# Patient Record
Sex: Female | Born: 1945
Health system: Southern US, Community
[De-identification: ages and names within clinical notes are randomized; demographics above are authoritative.]

## PROBLEM LIST (undated history)

## (undated) DIAGNOSIS — K219 Gastro-esophageal reflux disease without esophagitis: Secondary | ICD-10-CM

## (undated) DIAGNOSIS — L900 Lichen sclerosus et atrophicus: Principal | ICD-10-CM

## (undated) DIAGNOSIS — I10 Essential (primary) hypertension: Secondary | ICD-10-CM

## (undated) DIAGNOSIS — R87629 Unspecified abnormal cytological findings in specimens from vagina: Secondary | ICD-10-CM

## (undated) DIAGNOSIS — IMO0002 Reserved for concepts with insufficient information to code with codable children: Secondary | ICD-10-CM

## (undated) DIAGNOSIS — Z9221 Personal history of antineoplastic chemotherapy: Secondary | ICD-10-CM

## (undated) DIAGNOSIS — B369 Superficial mycosis, unspecified: Secondary | ICD-10-CM

## (undated) DIAGNOSIS — E119 Type 2 diabetes mellitus without complications: Secondary | ICD-10-CM

## (undated) DIAGNOSIS — Z923 Personal history of irradiation: Secondary | ICD-10-CM

## (undated) DIAGNOSIS — N904 Leukoplakia of vulva: Secondary | ICD-10-CM

## (undated) DIAGNOSIS — R87619 Unspecified abnormal cytological findings in specimens from cervix uteri: Secondary | ICD-10-CM

## (undated) DIAGNOSIS — C50919 Malignant neoplasm of unspecified site of unspecified female breast: Principal | ICD-10-CM

## (undated) DIAGNOSIS — E7800 Pure hypercholesterolemia, unspecified: Secondary | ICD-10-CM

## (undated) DIAGNOSIS — L80 Vitiligo: Secondary | ICD-10-CM

## (undated) DIAGNOSIS — M8580 Other specified disorders of bone density and structure, unspecified site: Secondary | ICD-10-CM

## (undated) HISTORY — DX: Other specified disorders of bone density and structure, unspecified site: M85.80

## (undated) HISTORY — DX: Essential (primary) hypertension: I10

## (undated) HISTORY — DX: Gastro-esophageal reflux disease without esophagitis: K21.9

## (undated) HISTORY — DX: Type 2 diabetes mellitus without complications: E11.9

## (undated) HISTORY — PX: HYSTEROSCOPY: SHX211

## (undated) HISTORY — DX: Pure hypercholesterolemia, unspecified: E78.00

## (undated) HISTORY — DX: Vitiligo: L80

## (undated) HISTORY — DX: Personal history of irradiation: Z92.3

## (undated) HISTORY — DX: Leukoplakia of vulva: N90.4

## (undated) HISTORY — DX: Superficial mycosis, unspecified: B36.9

## (undated) HISTORY — DX: Lichen sclerosus et atrophicus: L90.0

## (undated) HISTORY — DX: Unspecified abnormal cytological findings in specimens from cervix uteri: R87.619

## (undated) HISTORY — DX: Reserved for concepts with insufficient information to code with codable children: IMO0002

## (undated) HISTORY — DX: Malignant neoplasm of unspecified site of unspecified female breast: C50.919

## (undated) HISTORY — DX: Unspecified abnormal cytological findings in specimens from vagina: R87.629

## (undated) HISTORY — PX: BLADDER SUSPENSION: SHX72

---

## 1991-10-31 HISTORY — PX: TUBAL LIGATION: SHX77

## 2002-11-21 ENCOUNTER — Ambulatory Visit (HOSPITAL_COMMUNITY): Admission: RE | Admit: 2002-11-21 | Discharge: 2002-11-21 | Payer: Self-pay | Admitting: Internal Medicine

## 2004-02-11 ENCOUNTER — Ambulatory Visit (HOSPITAL_COMMUNITY): Admission: RE | Admit: 2004-02-11 | Discharge: 2004-02-11 | Payer: Self-pay | Admitting: Obstetrics & Gynecology

## 2004-09-30 ENCOUNTER — Ambulatory Visit (HOSPITAL_COMMUNITY): Admission: RE | Admit: 2004-09-30 | Discharge: 2004-09-30 | Payer: Self-pay | Admitting: Obstetrics and Gynecology

## 2004-10-10 ENCOUNTER — Ambulatory Visit: Payer: Self-pay | Admitting: Orthopedic Surgery

## 2007-07-09 ENCOUNTER — Ambulatory Visit (HOSPITAL_COMMUNITY): Admission: RE | Admit: 2007-07-09 | Discharge: 2007-07-09 | Payer: Self-pay | Admitting: Family Medicine

## 2007-07-24 ENCOUNTER — Ambulatory Visit (HOSPITAL_COMMUNITY): Admission: RE | Admit: 2007-07-24 | Discharge: 2007-07-24 | Payer: Self-pay | Admitting: Family Medicine

## 2007-08-16 ENCOUNTER — Ambulatory Visit (HOSPITAL_COMMUNITY): Admission: RE | Admit: 2007-08-16 | Discharge: 2007-08-16 | Payer: Self-pay | Admitting: Internal Medicine

## 2007-08-16 ENCOUNTER — Encounter: Payer: Self-pay | Admitting: Internal Medicine

## 2007-08-16 ENCOUNTER — Ambulatory Visit: Payer: Self-pay | Admitting: Internal Medicine

## 2008-07-27 ENCOUNTER — Ambulatory Visit (HOSPITAL_COMMUNITY): Admission: RE | Admit: 2008-07-27 | Discharge: 2008-07-27 | Payer: Self-pay | Admitting: Obstetrics and Gynecology

## 2008-07-28 ENCOUNTER — Encounter (INDEPENDENT_AMBULATORY_CARE_PROVIDER_SITE_OTHER): Payer: Self-pay | Admitting: Diagnostic Radiology

## 2008-07-28 ENCOUNTER — Encounter: Admission: RE | Admit: 2008-07-28 | Discharge: 2008-07-28 | Payer: Self-pay | Admitting: Obstetrics and Gynecology

## 2008-08-04 ENCOUNTER — Ambulatory Visit (HOSPITAL_COMMUNITY): Admission: RE | Admit: 2008-08-04 | Discharge: 2008-08-04 | Payer: Self-pay | Admitting: Obstetrics and Gynecology

## 2008-08-19 ENCOUNTER — Encounter: Admission: RE | Admit: 2008-08-19 | Discharge: 2008-08-19 | Payer: Self-pay | Admitting: Surgery

## 2008-08-20 ENCOUNTER — Encounter (INDEPENDENT_AMBULATORY_CARE_PROVIDER_SITE_OTHER): Payer: Self-pay | Admitting: Surgery

## 2008-08-20 ENCOUNTER — Ambulatory Visit (HOSPITAL_BASED_OUTPATIENT_CLINIC_OR_DEPARTMENT_OTHER): Admission: RE | Admit: 2008-08-20 | Discharge: 2008-08-20 | Payer: Self-pay | Admitting: Surgery

## 2008-08-20 ENCOUNTER — Encounter: Admission: RE | Admit: 2008-08-20 | Discharge: 2008-08-20 | Payer: Self-pay | Admitting: Surgery

## 2008-08-20 HISTORY — PX: BREAST LUMPECTOMY: SHX2

## 2008-08-21 ENCOUNTER — Ambulatory Visit (HOSPITAL_COMMUNITY): Admission: RE | Admit: 2008-08-21 | Discharge: 2008-08-21 | Payer: Self-pay | Admitting: Surgery

## 2008-08-26 ENCOUNTER — Ambulatory Visit: Payer: Self-pay | Admitting: Oncology

## 2008-09-02 LAB — CBC WITH DIFFERENTIAL/PLATELET
Basophils Absolute: 0 10*3/uL (ref 0.0–0.1)
EOS%: 1.2 % (ref 0.0–7.0)
Eosinophils Absolute: 0.1 10*3/uL (ref 0.0–0.5)
LYMPH%: 14.7 % (ref 14.0–48.0)
MCH: 29.4 pg (ref 26.0–34.0)
MCV: 88.7 fL (ref 81.0–101.0)
MONO%: 5.5 % (ref 0.0–13.0)
Platelets: 268 10*3/uL (ref 145–400)
RBC: 4.61 10*6/uL (ref 3.70–5.32)
RDW: 13.1 % (ref 11.3–14.5)

## 2008-09-03 ENCOUNTER — Encounter (HOSPITAL_COMMUNITY): Admission: RE | Admit: 2008-09-03 | Discharge: 2008-10-03 | Payer: Self-pay | Admitting: Oncology

## 2008-09-03 LAB — COMPREHENSIVE METABOLIC PANEL
Alkaline Phosphatase: 67 U/L (ref 39–117)
Creatinine, Ser: 1.14 mg/dL (ref 0.40–1.20)
Glucose, Bld: 122 mg/dL — ABNORMAL HIGH (ref 70–99)
Sodium: 140 mEq/L (ref 135–145)
Total Bilirubin: 0.6 mg/dL (ref 0.3–1.2)
Total Protein: 7.5 g/dL (ref 6.0–8.3)

## 2008-09-03 LAB — CANCER ANTIGEN 27.29: CA 27.29: 29 U/mL (ref 0–39)

## 2008-09-15 LAB — BASIC METABOLIC PANEL
CO2: 24 mEq/L (ref 19–32)
Calcium: 9.1 mg/dL (ref 8.4–10.5)
Glucose, Bld: 88 mg/dL (ref 70–99)
Sodium: 140 mEq/L (ref 135–145)

## 2008-09-15 LAB — CBC WITH DIFFERENTIAL/PLATELET
Basophils Absolute: 0 10*3/uL (ref 0.0–0.1)
Eosinophils Absolute: 0.2 10*3/uL (ref 0.0–0.5)
HGB: 13.2 g/dL (ref 11.6–15.9)
MCV: 88.3 fL (ref 81.0–101.0)
MONO#: 0.7 10*3/uL (ref 0.1–0.9)
MONO%: 7.8 % (ref 0.0–13.0)
NEUT#: 6.1 10*3/uL (ref 1.5–6.5)
RBC: 4.47 10*6/uL (ref 3.70–5.32)
RDW: 13.1 % (ref 11.3–14.5)
WBC: 8.4 10*3/uL (ref 3.9–10.0)
lymph#: 1.5 10*3/uL (ref 0.9–3.3)

## 2008-09-17 ENCOUNTER — Ambulatory Visit (HOSPITAL_BASED_OUTPATIENT_CLINIC_OR_DEPARTMENT_OTHER): Admission: RE | Admit: 2008-09-17 | Discharge: 2008-09-17 | Payer: Self-pay | Admitting: Surgery

## 2008-09-28 LAB — CBC WITH DIFFERENTIAL/PLATELET
BASO%: 0.3 % (ref 0.0–2.0)
EOS%: 1.2 % (ref 0.0–7.0)
HCT: 41.2 % (ref 34.8–46.6)
LYMPH%: 12.8 % — ABNORMAL LOW (ref 14.0–48.0)
MCH: 29.7 pg (ref 26.0–34.0)
MCHC: 33.9 g/dL (ref 32.0–36.0)
MCV: 87.8 fL (ref 81.0–101.0)
MONO%: 15.3 % — ABNORMAL HIGH (ref 0.0–13.0)
NEUT%: 70.4 % (ref 39.6–76.8)
Platelets: 168 10*3/uL (ref 145–400)
lymph#: 1.1 10*3/uL (ref 0.9–3.3)

## 2008-10-12 ENCOUNTER — Ambulatory Visit: Payer: Self-pay | Admitting: Oncology

## 2008-10-12 LAB — CBC WITH DIFFERENTIAL/PLATELET
BASO%: 0.1 % (ref 0.0–2.0)
EOS%: 0.1 % (ref 0.0–7.0)
HCT: 36.7 % (ref 34.8–46.6)
MCH: 29.2 pg (ref 26.0–34.0)
MCHC: 34.4 g/dL (ref 32.0–36.0)
MCV: 85.1 fL (ref 81.0–101.0)
MONO%: 0.6 % (ref 0.0–13.0)
NEUT%: 94.8 % — ABNORMAL HIGH (ref 39.6–76.8)
lymph#: 0.8 10*3/uL — ABNORMAL LOW (ref 0.9–3.3)

## 2008-10-12 LAB — COMPREHENSIVE METABOLIC PANEL
ALT: 19 U/L (ref 0–35)
AST: 14 U/L (ref 0–37)
Alkaline Phosphatase: 62 U/L (ref 39–117)
Calcium: 9.5 mg/dL (ref 8.4–10.5)
Chloride: 105 mEq/L (ref 96–112)
Creatinine, Ser: 0.93 mg/dL (ref 0.40–1.20)
Total Bilirubin: 0.5 mg/dL (ref 0.3–1.2)

## 2008-10-19 LAB — CBC WITH DIFFERENTIAL/PLATELET
BASO%: 5.4 % — ABNORMAL HIGH (ref 0.0–2.0)
Basophils Absolute: 0.4 10*3/uL — ABNORMAL HIGH (ref 0.0–0.1)
EOS%: 7.4 % — ABNORMAL HIGH (ref 0.0–7.0)
HCT: 34.9 % (ref 34.8–46.6)
HGB: 11.8 g/dL (ref 11.6–15.9)
LYMPH%: 19.2 % (ref 14.0–48.0)
MCH: 29.9 pg (ref 26.0–34.0)
MCHC: 34 g/dL (ref 32.0–36.0)
MCV: 87.9 fL (ref 81.0–101.0)
NEUT%: 53.2 % (ref 39.6–76.8)
Platelets: 188 10*3/uL (ref 145–400)

## 2008-11-02 LAB — CBC WITH DIFFERENTIAL/PLATELET
Basophils Absolute: 0 10*3/uL (ref 0.0–0.1)
EOS%: 0 % (ref 0.0–7.0)
HGB: 11.6 g/dL (ref 11.6–15.9)
MCH: 29.9 pg (ref 26.0–34.0)
MCV: 89.9 fL (ref 81.0–101.0)
MONO%: 0.3 % (ref 0.0–13.0)
RDW: 14.7 % — ABNORMAL HIGH (ref 11.3–14.5)

## 2008-11-02 LAB — COMPREHENSIVE METABOLIC PANEL
ALT: 15 U/L (ref 0–35)
Albumin: 4 g/dL (ref 3.5–5.2)
CO2: 21 mEq/L (ref 19–32)
Chloride: 105 mEq/L (ref 96–112)
Creatinine, Ser: 0.96 mg/dL (ref 0.40–1.20)
Glucose, Bld: 205 mg/dL — ABNORMAL HIGH (ref 70–99)
Total Bilirubin: 0.5 mg/dL (ref 0.3–1.2)

## 2008-11-09 LAB — CBC WITH DIFFERENTIAL/PLATELET
BASO%: 0.4 % (ref 0.0–2.0)
EOS%: 4.6 % (ref 0.0–7.0)
MCH: 30.6 pg (ref 26.0–34.0)
MCHC: 34 g/dL (ref 32.0–36.0)
MCV: 90 fL (ref 81.0–101.0)
MONO%: 13.9 % — ABNORMAL HIGH (ref 0.0–13.0)
NEUT#: 4.5 10*3/uL (ref 1.5–6.5)
RBC: 3.41 10*6/uL — ABNORMAL LOW (ref 3.70–5.32)
RDW: 15.3 % — ABNORMAL HIGH (ref 11.3–14.5)

## 2008-11-09 LAB — TECHNOLOGIST REVIEW

## 2008-11-19 ENCOUNTER — Ambulatory Visit: Payer: Self-pay | Admitting: Oncology

## 2008-11-23 LAB — CBC WITH DIFFERENTIAL/PLATELET
BASO%: 0 % (ref 0.0–2.0)
EOS%: 0 % (ref 0.0–7.0)
MCHC: 33.2 g/dL (ref 32.0–36.0)
MONO#: 0 10*3/uL — ABNORMAL LOW (ref 0.1–0.9)
RBC: 3.42 10*6/uL — ABNORMAL LOW (ref 3.70–5.32)
WBC: 14 10*3/uL — ABNORMAL HIGH (ref 3.9–10.0)
lymph#: 0.5 10*3/uL — ABNORMAL LOW (ref 0.9–3.3)

## 2008-11-23 LAB — COMPREHENSIVE METABOLIC PANEL
ALT: 12 U/L (ref 0–35)
AST: 12 U/L (ref 0–37)
Albumin: 4.1 g/dL (ref 3.5–5.2)
Calcium: 9 mg/dL (ref 8.4–10.5)
Chloride: 107 mEq/L (ref 96–112)
Creatinine, Ser: 1.01 mg/dL (ref 0.40–1.20)
Potassium: 4.1 mEq/L (ref 3.5–5.3)

## 2008-11-30 LAB — CBC WITH DIFFERENTIAL/PLATELET
BASO%: 0.7 % (ref 0.0–2.0)
EOS%: 10.5 % — ABNORMAL HIGH (ref 0.0–7.0)
HCT: 31.4 % — ABNORMAL LOW (ref 34.8–46.6)
MCH: 31.1 pg (ref 26.0–34.0)
MCHC: 34.2 g/dL (ref 32.0–36.0)
MONO%: 15.7 % — ABNORMAL HIGH (ref 0.0–13.0)
NEUT%: 52.3 % (ref 39.6–76.8)
lymph#: 1 10*3/uL (ref 0.9–3.3)

## 2008-12-01 ENCOUNTER — Ambulatory Visit: Admission: RE | Admit: 2008-12-01 | Discharge: 2009-02-19 | Payer: Self-pay | Admitting: Radiation Oncology

## 2008-12-18 ENCOUNTER — Encounter: Payer: Self-pay | Admitting: Radiation Oncology

## 2008-12-18 ENCOUNTER — Ambulatory Visit: Admission: RE | Admit: 2008-12-18 | Discharge: 2008-12-18 | Payer: Self-pay | Admitting: Radiation Oncology

## 2008-12-18 ENCOUNTER — Ambulatory Visit: Payer: Self-pay | Admitting: Vascular Surgery

## 2009-01-05 ENCOUNTER — Ambulatory Visit: Payer: Self-pay | Admitting: Oncology

## 2009-01-28 LAB — CBC WITH DIFFERENTIAL/PLATELET
BASO%: 0.3 % (ref 0.0–2.0)
EOS%: 7.8 % — ABNORMAL HIGH (ref 0.0–7.0)
MCH: 30.2 pg (ref 25.1–34.0)
MCHC: 33.6 g/dL (ref 31.5–36.0)
RBC: 3.98 10*6/uL (ref 3.70–5.45)
RDW: 14.6 % — ABNORMAL HIGH (ref 11.2–14.5)
lymph#: 0.7 10*3/uL — ABNORMAL LOW (ref 0.9–3.3)

## 2009-01-28 LAB — COMPREHENSIVE METABOLIC PANEL
ALT: 13 U/L (ref 0–35)
AST: 14 U/L (ref 0–37)
Albumin: 4.2 g/dL (ref 3.5–5.2)
Calcium: 9.2 mg/dL (ref 8.4–10.5)
Chloride: 107 mEq/L (ref 96–112)
Creatinine, Ser: 0.98 mg/dL (ref 0.40–1.20)
Potassium: 4 mEq/L (ref 3.5–5.3)

## 2009-03-03 ENCOUNTER — Ambulatory Visit (HOSPITAL_BASED_OUTPATIENT_CLINIC_OR_DEPARTMENT_OTHER): Admission: RE | Admit: 2009-03-03 | Discharge: 2009-03-03 | Payer: Self-pay | Admitting: Surgery

## 2009-04-27 ENCOUNTER — Ambulatory Visit: Payer: Self-pay | Admitting: Oncology

## 2009-04-29 LAB — CBC WITH DIFFERENTIAL/PLATELET
MCH: 29.3 pg (ref 25.1–34.0)
MCHC: 33.9 g/dL (ref 31.5–36.0)
MONO#: 0.4 10*3/uL (ref 0.1–0.9)
MONO%: 6.7 % (ref 0.0–14.0)
NEUT%: 78.2 % — ABNORMAL HIGH (ref 38.4–76.8)
Platelets: 265 10*3/uL (ref 145–400)
RBC: 4.29 10*6/uL (ref 3.70–5.45)
WBC: 6.7 10*3/uL (ref 3.9–10.3)
lymph#: 0.8 10*3/uL — ABNORMAL LOW (ref 0.9–3.3)

## 2009-04-30 LAB — CANCER ANTIGEN 27.29: CA 27.29: 24 U/mL (ref 0–39)

## 2009-04-30 LAB — COMPREHENSIVE METABOLIC PANEL
Albumin: 4.2 g/dL (ref 3.5–5.2)
Alkaline Phosphatase: 68 U/L (ref 39–117)
BUN: 14 mg/dL (ref 6–23)
Calcium: 9.1 mg/dL (ref 8.4–10.5)
Chloride: 104 mEq/L (ref 96–112)
Glucose, Bld: 113 mg/dL — ABNORMAL HIGH (ref 70–99)
Potassium: 4.1 mEq/L (ref 3.5–5.3)

## 2009-04-30 LAB — VITAMIN D 25 HYDROXY (VIT D DEFICIENCY, FRACTURES): Vit D, 25-Hydroxy: 30 ng/mL (ref 30–89)

## 2009-07-15 ENCOUNTER — Ambulatory Visit (HOSPITAL_COMMUNITY): Admission: RE | Admit: 2009-07-15 | Discharge: 2009-07-15 | Payer: Self-pay | Admitting: Radiation Oncology

## 2009-07-28 ENCOUNTER — Ambulatory Visit: Payer: Self-pay | Admitting: Oncology

## 2009-07-28 ENCOUNTER — Other Ambulatory Visit: Admission: RE | Admit: 2009-07-28 | Discharge: 2009-07-28 | Payer: Self-pay | Admitting: Obstetrics and Gynecology

## 2009-07-30 LAB — CBC WITH DIFFERENTIAL/PLATELET
Basophils Absolute: 0 10*3/uL (ref 0.0–0.1)
Eosinophils Absolute: 0.1 10*3/uL (ref 0.0–0.5)
HGB: 12.8 g/dL (ref 11.6–15.9)
MONO#: 0.4 10*3/uL (ref 0.1–0.9)
NEUT#: 5.3 10*3/uL (ref 1.5–6.5)
RDW: 13 % (ref 11.2–14.5)
WBC: 6.7 10*3/uL (ref 3.9–10.3)
lymph#: 0.9 10*3/uL (ref 0.9–3.3)

## 2009-07-30 LAB — COMPREHENSIVE METABOLIC PANEL
Albumin: 4 g/dL (ref 3.5–5.2)
BUN: 18 mg/dL (ref 6–23)
Calcium: 9 mg/dL (ref 8.4–10.5)
Chloride: 105 mEq/L (ref 96–112)
Glucose, Bld: 115 mg/dL — ABNORMAL HIGH (ref 70–99)
Potassium: 4.1 mEq/L (ref 3.5–5.3)
Sodium: 141 mEq/L (ref 135–145)
Total Protein: 6.9 g/dL (ref 6.0–8.3)

## 2009-07-30 LAB — CANCER ANTIGEN 27.29: CA 27.29: 26 U/mL (ref 0–39)

## 2009-11-26 ENCOUNTER — Ambulatory Visit: Payer: Self-pay | Admitting: Oncology

## 2009-11-30 LAB — COMPREHENSIVE METABOLIC PANEL
ALT: 14 U/L (ref 0–35)
CO2: 28 mEq/L (ref 19–32)
Calcium: 9.1 mg/dL (ref 8.4–10.5)
Chloride: 103 mEq/L (ref 96–112)
Creatinine, Ser: 1.18 mg/dL (ref 0.40–1.20)
Glucose, Bld: 107 mg/dL — ABNORMAL HIGH (ref 70–99)
Sodium: 140 mEq/L (ref 135–145)
Total Protein: 6.9 g/dL (ref 6.0–8.3)

## 2009-11-30 LAB — CBC WITH DIFFERENTIAL/PLATELET
BASO%: 0.2 % (ref 0.0–2.0)
Eosinophils Absolute: 0.2 10*3/uL (ref 0.0–0.5)
HCT: 38.9 % (ref 34.8–46.6)
MCHC: 33.2 g/dL (ref 31.5–36.0)
MONO#: 0.6 10*3/uL (ref 0.1–0.9)
NEUT#: 5.8 10*3/uL (ref 1.5–6.5)
NEUT%: 76.2 % (ref 38.4–76.8)
WBC: 7.6 10*3/uL (ref 3.9–10.3)
lymph#: 1 10*3/uL (ref 0.9–3.3)

## 2009-11-30 LAB — LACTATE DEHYDROGENASE: LDH: 124 U/L (ref 94–250)

## 2010-06-10 ENCOUNTER — Ambulatory Visit: Payer: Self-pay | Admitting: Oncology

## 2010-06-14 LAB — COMPREHENSIVE METABOLIC PANEL
Albumin: 4.3 g/dL (ref 3.5–5.2)
Alkaline Phosphatase: 72 U/L (ref 39–117)
Glucose, Bld: 163 mg/dL — ABNORMAL HIGH (ref 70–99)
Potassium: 3.8 mEq/L (ref 3.5–5.3)
Sodium: 142 mEq/L (ref 135–145)
Total Protein: 6.9 g/dL (ref 6.0–8.3)

## 2010-06-14 LAB — CBC WITH DIFFERENTIAL/PLATELET
Eosinophils Absolute: 0.2 10*3/uL (ref 0.0–0.5)
MCV: 89.4 fL (ref 79.5–101.0)
MONO#: 0.3 10*3/uL (ref 0.1–0.9)
MONO%: 4 % (ref 0.0–14.0)
NEUT#: 6 10*3/uL (ref 1.5–6.5)
RBC: 4.29 10*6/uL (ref 3.70–5.45)
RDW: 13.4 % (ref 11.2–14.5)
WBC: 7.5 10*3/uL (ref 3.9–10.3)

## 2010-06-14 LAB — VITAMIN D 25 HYDROXY (VIT D DEFICIENCY, FRACTURES): Vit D, 25-Hydroxy: 32 ng/mL (ref 30–89)

## 2010-07-20 ENCOUNTER — Ambulatory Visit (HOSPITAL_COMMUNITY): Admission: RE | Admit: 2010-07-20 | Discharge: 2010-07-20 | Payer: Self-pay | Admitting: Oncology

## 2010-12-15 ENCOUNTER — Other Ambulatory Visit: Payer: Self-pay | Admitting: Oncology

## 2010-12-15 ENCOUNTER — Encounter (HOSPITAL_BASED_OUTPATIENT_CLINIC_OR_DEPARTMENT_OTHER): Payer: BC Managed Care – PPO | Admitting: Oncology

## 2010-12-15 DIAGNOSIS — C50419 Malignant neoplasm of upper-outer quadrant of unspecified female breast: Secondary | ICD-10-CM

## 2010-12-15 LAB — CBC WITH DIFFERENTIAL/PLATELET
BASO%: 0.3 % (ref 0.0–2.0)
Basophils Absolute: 0 10*3/uL (ref 0.0–0.1)
EOS%: 1.9 % (ref 0.0–7.0)
Eosinophils Absolute: 0.2 10*3/uL (ref 0.0–0.5)
HCT: 38.8 % (ref 34.8–46.6)
HGB: 13 g/dL (ref 11.6–15.9)
LYMPH%: 17.6 % (ref 14.0–49.7)
MCV: 90.6 fL (ref 79.5–101.0)
MONO#: 0.4 10*3/uL (ref 0.1–0.9)
MONO%: 4.9 % (ref 0.0–14.0)
NEUT#: 6.3 10*3/uL (ref 1.5–6.5)
Platelets: 268 10*3/uL (ref 145–400)
RBC: 4.28 10*6/uL (ref 3.70–5.45)
WBC: 8.3 10*3/uL (ref 3.9–10.3)
lymph#: 1.5 10*3/uL (ref 0.9–3.3)

## 2010-12-16 LAB — COMPREHENSIVE METABOLIC PANEL
BUN: 10 mg/dL (ref 6–23)
CO2: 28 mEq/L (ref 19–32)
Creatinine, Ser: 1.12 mg/dL (ref 0.40–1.20)
Glucose, Bld: 140 mg/dL — ABNORMAL HIGH (ref 70–99)
Total Bilirubin: 0.4 mg/dL (ref 0.3–1.2)

## 2010-12-16 LAB — CANCER ANTIGEN 27.29: CA 27.29: 24 U/mL (ref 0–39)

## 2010-12-16 LAB — LACTATE DEHYDROGENASE: LDH: 126 U/L (ref 94–250)

## 2011-02-08 LAB — POCT I-STAT, CHEM 8
BUN: 15 mg/dL (ref 6–23)
Calcium, Ion: 1.21 mmol/L (ref 1.12–1.32)
Creatinine, Ser: 1.1 mg/dL (ref 0.4–1.2)
TCO2: 28 mmol/L (ref 0–100)

## 2011-03-14 NOTE — Op Note (Signed)
NAMEKEYSHLA, Tiffany Hanson NO.:  [id number]   MEDICAL RECORD NO.:  [id number]          PATIENT TYPE:  OUT   LOCATION:  NUC                          FACILITY:  [facility]   PHYSICIAN:  Thomas A. Cornett, M.D.DATE OF BIRTH:  24-May-1946   DATE OF PROCEDURE:  DATE OF DISCHARGE:                               OPERATIVE REPORT   PREOPERATIVE DIAGNOSES:  1. Right breast cancer.  2. Left arm skin tag.   POSTOPERATIVE DIAGNOSIS:  1. Right breast cancer.  2. Left arm skin tag.   PROCEDURES:  1. Right breast needle-localized lumpectomy.  2. Right axillary sentinel lymph node mapping with injection of      methylene blue dye.  3. Excision of left arm skin tag.   SURGEON:  Maisie Fus A. Cornett, MD   ANESTHESIA:  LMA with 0.25% Sensorcaine local.   ESTIMATED BLOOD LOSS:  20 mL.   SPECIMENS:  1. Right breast mass with localizing wire clip and mass, all      visualized by radiography.  2. Three right axillary sentinel lymph nodes, two were negative by      touch prep, third showed atypical cells, but not clear if this is      breast cancer or other abnormality.   DRAINS:  None.   INDICATIONS FOR PROCEDURE:  The patient is a 65 year old postmenopausal  female found to have a right breast cancer.  She wished to undergo  breast conserving measures and presents today for lumpectomy with  sentinel lymph node mapping.  She also has a small skin tag on her left  forearm, she wished to have removed today.  Informed consent was  obtained for all the above.   DESCRIPTION OF PROCEDURE:  After undergoing right breast needle  localization and right breast nuclear medicine injection, the patient  was taken back to the operating room.  After induction of general  anesthesia, the left arm was prepped with alcohol and a 5-mm skin tag  was removed using scissors.  Hemostasis was excellent.   Next, the right breast was then prepped with alcohol and 5 mL of a  combination of 2 mL of methylene  blue diluted with 3 mL of saline were  injected in a subareolar position.  Massage was then done.  The right  breast and right axilla were then prepped and draped in a sterile  fashion.  The wire was trimmed.  NeoProbe was used and the axilla was  addressed first.  NeoProbe found an appropriate hot spot in the right  axilla.  Incision was made over this.  Dissection was carried down into  the axillary lymph node contents.  We identified initially a hot, but  nonblue sentinel nodes and sent it off.  There was a conglomerate of  two.  The node stuck together which concerned me and I removed both of  these and these were both hot but not blue as well.  There was no other  abnormality of the axilla when I examined and there were no other hot  spots by the NeoProbe or any evidence of blue dye elsewhere.  Touch  prep  revealed two of the nodes, the first and third to have no evidence of  metastatic disease.  The second node that showed some atypical cells,  but the pathologist could not be sure this was breast cancer or  something else.  This did not look obvious of a light breast cancer,  though.   Hemostasis was achieved in the axilla.  A dry lap was placed there.   Next, the breast was addressed.  Curvilinear incision was made over the  central lateral breast where the wire exited.  Dissection was carried  down to and I found the wire.  The tumor was quite deep in the chest  wall.  I was able to excise the entire mass in its entirety.  There was  no evidence of any gross disease that I could see.  I also took  additional margins as well to ensure clear margins.  Tumor felt to be  about a centimeter in size.  A radiograph was taken and this was shown  to contained the specimen in its entirety.  Wound was irrigated with  saline.  It was closed in layers, the deep layer of 3-0 Vicryl and a  subsequent 4-0 Monocryl subcuticular stitch.  The axillary wound was  examined.  Hemostasis was  excellent.  It was closed in a similar fashion  using a deep layer of 3-0 Vicryl and subsequent 4-0 Monocryl.  Dermabond  was applied to both incisions.  All final counts of sponge, needle, and  instruments were found to be correct for this portion of the case.  The  patient was then awoke, taken to recovery in satisfactory condition.      Thomas A. Cornett, M.D.  Electronically Signed     TAC/MEDQ  D:  08/20/2008  T:  08/21/2008  Job:  [id number]   cc:   Patrica Duel, M.D.

## 2011-03-14 NOTE — Op Note (Signed)
[name], Tiffany Hanson              ACCOUNT NO.:  [id number]   MEDICAL RECORD NO.:  [id number]          PATIENT TYPE:  AMB   LOCATION:  DSC                          FACILITY:  [facility]   PHYSICIAN:  Thomas A. Cornett, M.D.DATE OF BIRTH:  Feb 27, 1946   DATE OF PROCEDURE:  09/17/2008  DATE OF DISCHARGE:                               OPERATIVE REPORT   PREOPERATIVE DIAGNOSIS:  Right breast cancer, T2 N0 MX.   POSTOPERATIVE DIAGNOSIS:  Right breast cancer, T2 N0 MX.   PROCEDURE:  1. Attempted placement of right subclavian Port-A-Cath.  2. Placement of right internal jugular 8-French power Port-A-Cath with      fluoroscopy.   SURGEON:  Maisie Fus A. Cornett, MD   ANESTHESIA:  LMA with 0.25% Sensorcaine.   ESTIMATED BLOOD LOSS:  50 mL.   DRAINS:  None.   INDICATIONS FOR PROCEDURE:  The patient is a 65 year old female with a  right breast cancer.  She was in need of chemotherapy and presents today  for placement of a Port-A-Cath since she is a triple negative.  Her  tumors are T2 N0 MX right breast cancer.  After informed consent was  obtained.  We discussed complications of bleeding, infection,  pneumothorax, hemothorax, pericardial tamponade, injury to other organs  to include mediastinal structures and structures in the neck as well as  catheter migration, blood clot formation in the catheter, and infection.  They understood and agreed to proceed.   DESCRIPTION OF PROCEDURE:  The patient was brought to the operating room  and placed supine.  After induction of LMA anesthesia, both arms were  tucked and the upper chest and neck regions were prepped and draped in  the sterile fashion.  The right subclavian vein was chosen first.  With  the patient in Trendelenburg, we were able to cannulate the right  subclavian vein.  The wire fed with the use an fluoroscopy.  The wire  kept going across the innominate vein into the left subclavian vein.  Under fluoroscopic guidance, I tried to  manipulate the wire numerous  times, but could not get it to go down the superior vena cava.  At this  point in time, I removed the wire.  I then tried to recannulate the left  subclavian vein.  Three more attempts were tried, but I could not get  into the vein satisfactorily at this point in time.  I felt an attempt  at the right internal jugular might be easier to do on her.   I then localized a right internal jugular vein with the finder needle.  I then introduced the needle into the right internal jugular vein and  took the wire in quite easily.  Fluoroscopy showed the wire to be going  down the superior vena cava through the heart into the inferior vena  cava.  Small skin incision was made there.  Next, used local anesthesia  and injected an area just below her right clavicle.  A 3-cm incision was  used and a small cavity was created for the port itself.  An 8-French  power port was then attached, flushed,  and brought on the operative  field.  I then tunneled the port catheter from the lower incision to the  upper incision where the wire exited.  I then cut the catheter to be  about 15 cm from the wire insertion site.  The patient was still in  Trendelenburg.  At this point in time, I then advanced the wire  introducer complex over the wire moving the wire to and fro without  resistance and it was totally in place.  I then removed the wire and the  dilator and left the introducer in place.  The catheter was placed into  the introducer and held in place with smooth pickups.  We then peeled  away the sheath of the cath without difficulty.  Fluoroscopy showed the  tip of the catheter to be in the mid superior vena cava.  There was no  kinking to report.  I then flushed.  I drew back on the catheter and  drew back easily and then I flushed it with dilute heparinized saline  and then placed 5 mL of 100 units/mL of heparinized saline into the  catheter.  We then secured the catheter to the  chest wall with 2-0  Prolene.  I then closed both wounds with combination of 3-0 Vicryl and 4-  0 Monocryl in a subcuticular stitch.  Dermabond was applied.  The LMA  was removed.  The patient was awoke, taken to the recovery in  satisfactory condition for chest x-ray.  All final counts were found to  be correct.      Thomas A. Cornett, M.D.  Electronically Signed     TAC/MEDQ  D:  09/17/2008  T:  09/18/2008  Job:  [id number]   cc:   Pierce Crane, MD

## 2011-03-14 NOTE — Op Note (Signed)
NAMEREHANA, UNCAPHER              ACCOUNT NO.:  [id number]   MEDICAL RECORD NO.:  [id number]          PATIENT TYPE:  AMB   LOCATION:  DAY                           FACILITY:  [facility]   PHYSICIAN:  R. Roetta Sessions, M.D. DATE OF BIRTH:  September 15, 1946   DATE OF PROCEDURE:  08/16/2007  DATE OF DISCHARGE:  08/16/2007                               OPERATIVE REPORT   PROCEDURE:  Colonoscopy and cold biopsy.   INDICATIONS FOR PROCEDURE:  This is a 65 year old African-American  female with a history of colonic adenoma removal back in 2004. She has  done well. She is here for surveillance. Colonoscopy was discussed with  the patient at length, potential risks, benefits and alternatives of the  procedure. All questions answered. She is agreeable. Please see the  documentation in the medical record.   PROCEDURE NOTE:  O2 saturation, blood pressure, pulse and respirations  were monitored during the entire procedure. Conscious sedation. Versed 4  mg IV, Demerol 100 mg IV in divided doses.   FINDINGS:  Digital rectal exam revealed no abnormalities. Prep was good.   COLON:  The colonic mucosa was surveyed from the rectosigmoid junction  through the left and right colon. to the area of the appendiceal  orifice, ileocecal valve, and cecum.  These structures were well seen  photographed for the record. From this level the scope was slowly  cautiously withdrawn.  All previous mucosal surfaces were again seen.  The patient three diminutive polyps, one at the hepatic flexure, two in  the mid descending colon, the greatest was no larger than approximately  4 mm. Occult biopsy removed. The remainder of the colonic mucosa  appeared normal. The scope was pulled out of the rectum, where the  rectum was inspected, the rectal vault was small. I was unable to  retroflex as the rectal vault was small, but for the same reason, I was  unable to see the rectal mucosa __________  appeared normal. The patient  tolerated the procedure well and was reacted in endoscopy.   IMPRESSION:  Diminutive colonic polyps removed with cold biopsy forceps  technique as described above, otherwise normal appearing colon and  rectum.   RECOMMENDATIONS:  Follow up on path.  Further recommendations to follow.  .      R. Roetta Sessions, M.D.  Electronically Signed    RMR/MEDQ  D:  08/16/2007  T:  08/18/2007  Job:  [id number]   cc:   Patrica Duel, M.D.  Fax: [phone]

## 2011-03-14 NOTE — Op Note (Signed)
[name], Tiffany Hanson              ACCOUNT NO.:  [id number]   MEDICAL RECORD NO.:  [id number]          PATIENT TYPE:  AMB   LOCATION:  DSC                          FACILITY:  [facility]   PHYSICIAN:  Thomas A. Cornett, M.D.DATE OF BIRTH:  10-26-1946   DATE OF PROCEDURE:  03/03/2009  DATE OF DISCHARGE:                               OPERATIVE REPORT   PREOPERATIVE DIAGNOSIS:  History of breast cancer with previous  placement of right internal jugular Port-A-Cath.   POSTOPERATIVE DIAGNOSIS:  History of breast cancer with previous  placement of right internal jugular Port-A-Cath.   PROCEDURE:  Explantation of right internal jugular Port-A-Cath.   SURGEON:  Maisie Fus A. Cornett, MD   ANESTHESIA:  MAC with 0.25% Sensorcaine local.   ESTIMATED BLOOD LOSS:  Minimal.   SPECIMEN:  None.   INDICATIONS FOR PROCEDURE:  The patient is a 65 year old female who has  completed chemotherapy for breast cancer.  She had a Port-A-Cath placed  for this and is here today to have her removed.   DESCRIPTION OF PROCEDURE:  The patient was brought to the operating room  and placed supine.  After induction of adequate MAC anesthesia, the  right upper chest and neck region were prepped and draped in sterile  fashion.  The Port-A-Cath was easily palpable and the incision was  infiltrated with 0.25% Sensorcaine with epinephrine local.  Incision was  made.  The Port-A-Cath was then grasp, the sutures were cut, and a  finger was held over the tract as the catheter was removed.  Figure-of-  eight suture was placed to close the tract.  A 3-0 Vicryl was used to  close the cavity with a deep layer and a 4-0 Monocryl was used to close  the skin in a subcuticular fashion.  Dermabond was applied.  All final  counts, sponge, needle, and instrument were found to be correct at this  portion of the case.  The patient was then awoke and taken to the  recovery room in satisfactory condition.      Thomas A. Cornett, M.D.  Electronically Signed     TAC/MEDQ  D:  03/03/2009  T:  03/04/2009  Job:  [id number]   cc:   Valentino Hue. Magrinat, M.D.

## 2011-03-17 NOTE — H&P (Signed)
NAME:  Tiffany Hanson, Tiffany Hanson NO.:  [id number]   MEDICAL RECORD NO.:  [id number]                   PATIENT TYPE:  AMB   LOCATION:  DAY                                  FACILITY:  [facility]   PHYSICIAN:  Lazaro Arms, M.D.                DATE OF BIRTH:  1946/10/28   DATE OF ADMISSION:  DATE OF DISCHARGE:                                HISTORY & PHYSICAL   HISTORY OF PRESENT ILLNESS:  Tiffany Hanson is a 65 year old female, gravida 3,  para 3, status post tubal ligation, who is admitted for evaluation of a  widened endometrial stripe, probable endometrial polyp.  The patient was  having hot flushes and was begun on a ClimaraPro patch.  One week later, the  patient started spotting.  I brought her in and did an ultrasound which  revealed a very homogeneous, dumbbell-shaped endometrial stripe consistent  with endometrial polyp.  As a result, she is admitted for hysteroscopy, D&C  and endometrial ablation.   PAST MEDICAL HISTORY:  1. Hypertension.  2. Menopause.   PAST SURGICAL HISTORY:  1. Tubal ligation.  2. Bladder tack.   PAST OBSTETRICAL HISTORY:  Three vaginal deliveries.   MEDICATIONS:  1. Norvasc 5 mg.  2. ClimaraPro patch.   ALLERGIES:  None.   REVIEW OF SYSTEMS:  Review of systems otherwise negative.   PHYSICAL EXAMINATION:  VITAL SIGNS:  Blood pressure is 150/80.  Weight is  194 pounds.  HEENT:  Unremarkable.  NECK:  Thyroid is normal.  LUNGS:  Lungs are clear.  HEART:  Regular rate and rhythm without murmur, regurgitation or gallop.  BREASTS:  Deferred.  ABDOMEN:  Benign.  No hepatosplenomegaly or masses.  PELVIC:  Vagina is pink, moist, no discharge.  Cervix is parous without  lesions.  Uterus normal size, shape and contour.  Adnexa are not palpable.  EXTREMITIES:  Extremities are warm with no edema.  NEUROLOGICAL:  Exam is grossly intact.   IMPRESSION:  Postmenopausal bleeding with a widened endometrial stripe  consistent with endometrial  polyp.   PLAN:  The patient is admitted for hysteroscopy, D&C and endometrial  ablation.     ___________________________________________                                         Lazaro Arms, M.D.   [name]  D:  02/10/2004  T:  02/11/2004  Job:  [id number]

## 2011-06-08 ENCOUNTER — Encounter (HOSPITAL_BASED_OUTPATIENT_CLINIC_OR_DEPARTMENT_OTHER): Payer: BC Managed Care – PPO | Admitting: Oncology

## 2011-06-08 ENCOUNTER — Other Ambulatory Visit: Payer: Self-pay | Admitting: Oncology

## 2011-06-08 DIAGNOSIS — C50419 Malignant neoplasm of upper-outer quadrant of unspecified female breast: Secondary | ICD-10-CM

## 2011-06-08 LAB — CBC WITH DIFFERENTIAL/PLATELET
Eosinophils Absolute: 0.1 10*3/uL (ref 0.0–0.5)
LYMPH%: 13.4 % — ABNORMAL LOW (ref 14.0–49.7)
MCHC: 34.2 g/dL (ref 31.5–36.0)
MCV: 88.4 fL (ref 79.5–101.0)
MONO%: 4.9 % (ref 0.0–14.0)
NEUT#: 7.5 10*3/uL — ABNORMAL HIGH (ref 1.5–6.5)
Platelets: 259 10*3/uL (ref 145–400)
RBC: 4.14 10*6/uL (ref 3.70–5.45)

## 2011-06-08 LAB — COMPREHENSIVE METABOLIC PANEL
Alkaline Phosphatase: 70 U/L (ref 39–117)
Creatinine, Ser: 1.44 mg/dL — ABNORMAL HIGH (ref 0.50–1.10)
Glucose, Bld: 153 mg/dL — ABNORMAL HIGH (ref 70–99)
Sodium: 141 mEq/L (ref 135–145)
Total Bilirubin: 0.5 mg/dL (ref 0.3–1.2)
Total Protein: 7.1 g/dL (ref 6.0–8.3)

## 2011-06-08 LAB — CANCER ANTIGEN 27.29: CA 27.29: 27 U/mL (ref 0–39)

## 2011-06-08 LAB — VITAMIN D 25 HYDROXY (VIT D DEFICIENCY, FRACTURES): Vit D, 25-Hydroxy: 71 ng/mL (ref 30–89)

## 2011-06-15 ENCOUNTER — Encounter (HOSPITAL_BASED_OUTPATIENT_CLINIC_OR_DEPARTMENT_OTHER): Payer: BC Managed Care – PPO | Admitting: Oncology

## 2011-06-15 DIAGNOSIS — C50419 Malignant neoplasm of upper-outer quadrant of unspecified female breast: Secondary | ICD-10-CM

## 2011-07-27 ENCOUNTER — Other Ambulatory Visit (HOSPITAL_COMMUNITY): Payer: Self-pay | Admitting: Nephrology

## 2011-07-27 DIAGNOSIS — N289 Disorder of kidney and ureter, unspecified: Secondary | ICD-10-CM

## 2011-08-01 LAB — POCT HEMOGLOBIN-HEMACUE: Hemoglobin: 12.9

## 2011-08-01 LAB — DIFFERENTIAL
Basophils Absolute: 0
Basophils Relative: 0
Eosinophils Absolute: 0.1
Monocytes Relative: 6
Neutro Abs: 6.3
Neutrophils Relative %: 73

## 2011-08-01 LAB — COMPREHENSIVE METABOLIC PANEL
ALT: 12
AST: 15
Calcium: 9.1
Creatinine, Ser: 1.13
GFR calc Af Amer: 59 — ABNORMAL LOW
Sodium: 140
Total Protein: 7.3

## 2011-08-01 LAB — CBC
MCHC: 33.6
Platelets: 255
RDW: 12.9

## 2011-08-07 ENCOUNTER — Ambulatory Visit (HOSPITAL_COMMUNITY): Payer: BC Managed Care – PPO

## 2011-08-08 ENCOUNTER — Other Ambulatory Visit: Payer: Self-pay | Admitting: Oncology

## 2011-08-08 DIAGNOSIS — Z139 Encounter for screening, unspecified: Secondary | ICD-10-CM

## 2011-08-09 ENCOUNTER — Ambulatory Visit (HOSPITAL_COMMUNITY)
Admission: RE | Admit: 2011-08-09 | Discharge: 2011-08-09 | Disposition: A | Discharge status: Home / Self Care | Payer: BC Managed Care – PPO | Source: Ambulatory Visit | Attending: Nephrology | Admitting: Nephrology

## 2011-08-09 DIAGNOSIS — N289 Disorder of kidney and ureter, unspecified: Secondary | ICD-10-CM | POA: Insufficient documentation

## 2011-08-30 ENCOUNTER — Ambulatory Visit (HOSPITAL_COMMUNITY)
Admission: RE | Admit: 2011-08-30 | Discharge: 2011-08-30 | Disposition: A | Discharge status: Home / Self Care | Payer: BC Managed Care – PPO | Source: Ambulatory Visit | Attending: Oncology | Admitting: Oncology

## 2011-08-30 DIAGNOSIS — Z853 Personal history of malignant neoplasm of breast: Secondary | ICD-10-CM | POA: Insufficient documentation

## 2011-08-30 DIAGNOSIS — Z139 Encounter for screening, unspecified: Secondary | ICD-10-CM

## 2011-08-30 DIAGNOSIS — Z08 Encounter for follow-up examination after completed treatment for malignant neoplasm: Secondary | ICD-10-CM

## 2012-01-05 ENCOUNTER — Other Ambulatory Visit: Payer: Self-pay | Admitting: Oncology

## 2012-01-05 ENCOUNTER — Other Ambulatory Visit (HOSPITAL_BASED_OUTPATIENT_CLINIC_OR_DEPARTMENT_OTHER): Payer: BC Managed Care – PPO | Admitting: Lab

## 2012-01-05 DIAGNOSIS — C50919 Malignant neoplasm of unspecified site of unspecified female breast: Secondary | ICD-10-CM

## 2012-01-05 DIAGNOSIS — C50419 Malignant neoplasm of upper-outer quadrant of unspecified female breast: Secondary | ICD-10-CM

## 2012-01-05 DIAGNOSIS — E559 Vitamin D deficiency, unspecified: Secondary | ICD-10-CM

## 2012-01-05 LAB — CBC WITH DIFFERENTIAL/PLATELET
BASO%: 0.2 % (ref 0.0–2.0)
Eosinophils Absolute: 0.1 10*3/uL (ref 0.0–0.5)
LYMPH%: 13.5 % — ABNORMAL LOW (ref 14.0–49.7)
MCHC: 33.1 g/dL (ref 31.5–36.0)
MONO#: 0.5 10*3/uL (ref 0.1–0.9)
NEUT#: 7 10*3/uL — ABNORMAL HIGH (ref 1.5–6.5)
RBC: 4.17 10*6/uL (ref 3.70–5.45)
RDW: 14.1 % (ref 11.2–14.5)
WBC: 8.8 10*3/uL (ref 3.9–10.3)
lymph#: 1.2 10*3/uL (ref 0.9–3.3)

## 2012-01-06 LAB — COMPREHENSIVE METABOLIC PANEL
ALT: 8 U/L (ref 0–35)
Albumin: 4.1 g/dL (ref 3.5–5.2)
Alkaline Phosphatase: 83 U/L (ref 39–117)
CO2: 29 mEq/L (ref 19–32)
Glucose, Bld: 121 mg/dL — ABNORMAL HIGH (ref 70–99)
Potassium: 3.8 mEq/L (ref 3.5–5.3)
Sodium: 139 mEq/L (ref 135–145)
Total Bilirubin: 0.4 mg/dL (ref 0.3–1.2)
Total Protein: 7.1 g/dL (ref 6.0–8.3)

## 2012-01-06 LAB — CANCER ANTIGEN 27.29: CA 27.29: 27 U/mL (ref 0–39)

## 2012-01-06 LAB — LACTATE DEHYDROGENASE: LDH: 134 U/L (ref 94–250)

## 2012-01-12 ENCOUNTER — Telehealth: Payer: Self-pay | Admitting: *Deleted

## 2012-01-12 ENCOUNTER — Ambulatory Visit (HOSPITAL_BASED_OUTPATIENT_CLINIC_OR_DEPARTMENT_OTHER): Payer: BC Managed Care – PPO | Admitting: Oncology

## 2012-01-12 VITALS — BP 146/92 | HR 93 | Temp 97.8°F | Ht 65.5 in | Wt 183.5 lb

## 2012-01-12 DIAGNOSIS — C50419 Malignant neoplasm of upper-outer quadrant of unspecified female breast: Secondary | ICD-10-CM

## 2012-01-12 DIAGNOSIS — C50919 Malignant neoplasm of unspecified site of unspecified female breast: Secondary | ICD-10-CM

## 2012-01-12 NOTE — Progress Notes (Signed)
Hematology and Oncology Follow Up Visit  Tiffany Hanson [id number] 09/26/1946 66 y.o. 01/12/2012 12:39 PM PCP  Principle Diagnosis: History of T1 C. N0 breast cancer status post right lumpectomy sentinel lymph node evaluation, 10/ 22,009, for triple negative breast cancer, status post Q3 week TC chemotherapy x4, followed by radiation therapy completed in April 2010.  Interim History:  There have been no intercurrent illness, hospitalizations or medication changes.  Medications: I have reviewed the patient's current medications.  Allergies: Allergies not on file  Past Medical History, Surgical history, Social history, and Family History were reviewed and updated.  Review of Systems: Constitutional:  Negative for fever, chills, night sweats, anorexia, weight loss, pain. Cardiovascular: no chest pain or dyspnea on exertion Respiratory: no cough, shortness of breath, or wheezing Neurological: no TIA or stroke symptoms Dermatological: negative ENT: negative Skin Gastrointestinal: no abdominal pain, change in bowel habits, or black or bloody stools Genito-Urinary: no dysuria, trouble voiding, or hematuria Hematological and Lymphatic: negative Breast: negative for breast lumps Musculoskeletal: negative Remaining ROS negative.  Physical Exam: There were no vitals taken for this visit. ECOG: 0 General appearance: alert, cooperative and appears stated age Head: Normocephalic, without obvious abnormality, atraumatic Neck: no adenopathy, no carotid bruit, no JVD, supple, symmetrical, trachea midline and thyroid not enlarged, symmetric, no tenderness/mass/nodules Lymph nodes: Cervical, supraclavicular, and axillary nodes normal. Cardiac : regular rate and rhythm Pulmonary:clear to auscultation bilaterally and normal percussion bilaterally Breasts: inspection negative, no nipple discharge or bleeding, no masses or nodularity palpable Abdomen:soft, non-tender; bowel sounds normal; no  masses,  no organomegaly Extremities negative Neuro: alert, oriented, normal speech, no focal findings or movement disorder noted  Lab Results: Lab Results  Component Value Date   WBC 8.8 01/05/2012   HGB 12.3 01/05/2012   HCT 37.0 01/05/2012   MCV 88.9 01/05/2012   PLT 286 01/05/2012     Chemistry      Component Value Date/Time   NA 139 01/05/2012 1232   K 3.8 01/05/2012 1232   CL 103 01/05/2012 1232   CO2 29 01/05/2012 1232   BUN 13 01/05/2012 1232   CREATININE 1.11* 01/05/2012 1232      Component Value Date/Time   CALCIUM 9.4 01/05/2012 1232   ALKPHOS 83 01/05/2012 1232   AST 13 01/05/2012 1232   ALT 8 01/05/2012 1232   BILITOT 0.4 01/05/2012 1232      .pathology. Radiological Studies: chest X-ray n/a Mammogram 10/12- wnl Bone density n/a  Impression and Plan: Pleasant 66 year old woman status post lumpectomy chemotherapy and radiation for triple negative breast cancer, no evidence of disease 3 years from surgery. Followup 6 months.  More than 50% of the visit was spent in patient-related counselling   Pierce Crane, MD 3/15/201312:39 PM

## 2012-01-12 NOTE — Telephone Encounter (Signed)
gave patient appointment for 06-2012 printed out calendar and gave to the patient 

## 2012-01-16 ENCOUNTER — Telehealth: Payer: Self-pay | Admitting: *Deleted

## 2012-01-16 NOTE — Telephone Encounter (Signed)
made patient appointment for mammogram and bone density at [facility] on 01-2012 starting at 8:15am they will call the patient and inform her of the new date and time of the procedure

## 2012-07-18 ENCOUNTER — Encounter: Payer: Self-pay | Admitting: *Deleted

## 2012-07-23 ENCOUNTER — Other Ambulatory Visit (HOSPITAL_BASED_OUTPATIENT_CLINIC_OR_DEPARTMENT_OTHER): Payer: BC Managed Care – PPO | Admitting: Lab

## 2012-07-23 ENCOUNTER — Telehealth: Payer: Self-pay | Admitting: Oncology

## 2012-07-23 ENCOUNTER — Ambulatory Visit (HOSPITAL_BASED_OUTPATIENT_CLINIC_OR_DEPARTMENT_OTHER): Payer: BC Managed Care – PPO | Admitting: Oncology

## 2012-07-23 VITALS — BP 133/83 | HR 76 | Temp 98.4°F | Resp 20 | Ht 65.5 in | Wt 185.3 lb

## 2012-07-23 DIAGNOSIS — Z171 Estrogen receptor negative status [ER-]: Secondary | ICD-10-CM

## 2012-07-23 DIAGNOSIS — C50919 Malignant neoplasm of unspecified site of unspecified female breast: Secondary | ICD-10-CM

## 2012-07-23 DIAGNOSIS — E559 Vitamin D deficiency, unspecified: Secondary | ICD-10-CM

## 2012-07-23 DIAGNOSIS — C50419 Malignant neoplasm of upper-outer quadrant of unspecified female breast: Secondary | ICD-10-CM

## 2012-07-23 LAB — COMPREHENSIVE METABOLIC PANEL (CC13)
ALT: 9 U/L (ref 0–55)
AST: 13 U/L (ref 5–34)
Albumin: 3.8 g/dL (ref 3.5–5.0)
Calcium: 9.6 mg/dL (ref 8.4–10.4)
Chloride: 107 mEq/L (ref 98–107)
Potassium: 3.9 mEq/L (ref 3.5–5.1)
Sodium: 143 mEq/L (ref 136–145)
Total Protein: 7.3 g/dL (ref 6.4–8.3)

## 2012-07-23 LAB — CBC WITH DIFFERENTIAL/PLATELET
BASO%: 0.3 % (ref 0.0–2.0)
Basophils Absolute: 0 10*3/uL (ref 0.0–0.1)
Eosinophils Absolute: 0.2 10*3/uL (ref 0.0–0.5)
HGB: 12.7 g/dL (ref 11.6–15.9)
LYMPH%: 16.1 % (ref 14.0–49.7)
MCH: 29 pg (ref 25.1–34.0)
MCV: 88.7 fL (ref 79.5–101.0)
NEUT#: 7 10*3/uL — ABNORMAL HIGH (ref 1.5–6.5)
Platelets: 249 10*3/uL (ref 145–400)

## 2012-07-23 NOTE — Progress Notes (Signed)
Hematology and Oncology Follow Up Visit  Tiffany Hanson [id number] 06-Dec-1945 66 y.o. 07/23/2012 2:24 PM PCP  Principle Diagnosis: History of T1 C. N0 breast cancer status post right lumpectomy sentinel lymph node evaluation, 10/ 22,009, for triple negative breast cancer, status post Q3 week TC chemotherapy x4, followed by radiation therapy completed in April 2010.  Interim History:  There have been no intercurrent illness, hospitalizations or medication changes.  Medications: I have reviewed the patient's current medications.  Allergies: No Known Allergies  Past Medical History, Surgical history, Social history, and Family History were reviewed and updated.  Review of Systems: Constitutional:  Negative for fever, chills, night sweats, anorexia, weight loss, pain. Cardiovascular: no chest pain or dyspnea on exertion Respiratory: no cough, shortness of breath, or wheezing Neurological: no TIA or stroke symptoms Dermatological: negative ENT: negative Skin Gastrointestinal: no abdominal pain, change in bowel habits, or black or bloody stools Genito-Urinary: no dysuria, trouble voiding, or hematuria Hematological and Lymphatic: negative Breast: negative for breast lumps Musculoskeletal: negative Remaining ROS negative.  Physical Exam: Blood pressure 133/83, pulse 76, temperature 98.4 F (36.9 C), temperature source Oral, resp. rate 20, height 5' 5.5" (1.664 m), weight 185 lb 4.8 oz (84.052 kg). ECOG: 0 General appearance: alert, cooperative and appears stated age Head: Normocephalic, without obvious abnormality, atraumatic Neck: no adenopathy, no carotid bruit, no JVD, supple, symmetrical, trachea midline and thyroid not enlarged, symmetric, no tenderness/mass/nodules Lymph nodes: Cervical, supraclavicular, and axillary nodes normal. Cardiac : regular rate and rhythm Pulmonary:clear to auscultation bilaterally and normal percussion bilaterally Breasts: inspection negative, no  nipple discharge or bleeding, no masses or nodularity palpable Abdomen:soft, non-tender; bowel sounds normal; no masses,  no organomegaly Extremities negative Neuro: alert, oriented, normal speech, no focal findings or movement disorder noted  Lab Results: Lab Results  Component Value Date   WBC 9.3 07/23/2012   HGB 12.7 07/23/2012   HCT 38.9 07/23/2012   MCV 88.7 07/23/2012   PLT 249 07/23/2012     Chemistry      Component Value Date/Time   NA 139 01/05/2012 1232   K 3.8 01/05/2012 1232   CL 103 01/05/2012 1232   CO2 29 01/05/2012 1232   BUN 13 01/05/2012 1232   CREATININE 1.11* 01/05/2012 1232      Component Value Date/Time   CALCIUM 9.4 01/05/2012 1232   ALKPHOS 83 01/05/2012 1232   AST 13 01/05/2012 1232   ALT 8 01/05/2012 1232   BILITOT 0.4 01/05/2012 1232      .pathology. Radiological Studies: chest X-ray n/a Mammogram 10/12- wnl Bone density n/a  Impression and Plan: Pleasant 66 year old woman status post lumpectomy chemotherapy and radiation for triple negative breast cancer, no evidence of disease 3 years from surgery. Followup 6 months.  More than 50% of the visit was spent in patient-related counselling   Pierce Crane, MD 9/24/20132:24 PM

## 2012-07-23 NOTE — Telephone Encounter (Signed)
gve the pt her march 2014 appt calendar. S/w [facility] regarding the pt's mammo appt that she wanted to move up sooner than nov. Per rachel the pt is not due until oct 31st and if she came any sooner tan that date insurance may not pay for the mammo. Let them know that dr Donnie Coffin requested a US of the rt breast to be done the same time.

## 2012-07-24 LAB — VITAMIN D 25 HYDROXY (VIT D DEFICIENCY, FRACTURES): Vit D, 25-Hydroxy: 59 ng/mL (ref 30–89)

## 2012-09-04 ENCOUNTER — Ambulatory Visit (HOSPITAL_COMMUNITY)
Admission: RE | Admit: 2012-09-04 | Discharge: 2012-09-04 | Disposition: A | Discharge status: Home / Self Care | Payer: 59 | Source: Ambulatory Visit | Attending: Oncology | Admitting: Oncology

## 2012-09-04 ENCOUNTER — Ambulatory Visit (HOSPITAL_COMMUNITY)
Admission: RE | Admit: 2012-09-04 | Discharge: 2012-09-04 | Disposition: A | Discharge status: Home / Self Care | Payer: Medicare Other | Source: Ambulatory Visit | Attending: Oncology | Admitting: Oncology

## 2012-09-04 ENCOUNTER — Other Ambulatory Visit: Payer: Self-pay | Admitting: Oncology

## 2012-09-04 DIAGNOSIS — C50919 Malignant neoplasm of unspecified site of unspecified female breast: Secondary | ICD-10-CM

## 2012-09-04 DIAGNOSIS — Z1231 Encounter for screening mammogram for malignant neoplasm of breast: Secondary | ICD-10-CM | POA: Insufficient documentation

## 2012-09-04 DIAGNOSIS — N6459 Other signs and symptoms in breast: Secondary | ICD-10-CM | POA: Insufficient documentation

## 2012-09-04 DIAGNOSIS — Z853 Personal history of malignant neoplasm of breast: Secondary | ICD-10-CM | POA: Insufficient documentation

## 2012-11-18 ENCOUNTER — Telehealth: Payer: Self-pay | Admitting: *Deleted

## 2012-11-18 ENCOUNTER — Encounter: Payer: Self-pay | Admitting: Oncology

## 2012-11-18 NOTE — Telephone Encounter (Signed)
Confirmed 12/20/12 appt w/ pt.  Mailed letter & calendar to pt.

## 2012-11-24 ENCOUNTER — Other Ambulatory Visit: Payer: Self-pay | Admitting: *Deleted

## 2012-11-24 ENCOUNTER — Encounter: Payer: Self-pay | Admitting: *Deleted

## 2012-11-24 DIAGNOSIS — C50919 Malignant neoplasm of unspecified site of unspecified female breast: Secondary | ICD-10-CM

## 2012-11-24 NOTE — Progress Notes (Signed)
Mailed intake form to pt.

## 2012-12-13 ENCOUNTER — Telehealth: Payer: Self-pay | Admitting: Oncology

## 2012-12-13 NOTE — Telephone Encounter (Signed)
Per [facility] moved 2/21 appt to 2/17 w/[facility] due to [facility] CME. Not able to reach pt at home number or lm - vm full. Called cell (pt identified) and lmonvm re appt for 2/17 @ 1:45pm. Pt asked to call if she cannot keep this appt.

## 2012-12-16 ENCOUNTER — Encounter: Payer: Self-pay | Admitting: Gynecologic Oncology

## 2012-12-16 ENCOUNTER — Other Ambulatory Visit (HOSPITAL_BASED_OUTPATIENT_CLINIC_OR_DEPARTMENT_OTHER): Payer: Medicare Other | Admitting: Lab

## 2012-12-16 ENCOUNTER — Ambulatory Visit (HOSPITAL_BASED_OUTPATIENT_CLINIC_OR_DEPARTMENT_OTHER): Payer: 59 | Admitting: Gynecologic Oncology

## 2012-12-16 VITALS — BP 135/87 | HR 72 | Temp 97.4°F | Resp 20 | Ht 65.5 in | Wt 188.1 lb

## 2012-12-16 DIAGNOSIS — C50919 Malignant neoplasm of unspecified site of unspecified female breast: Secondary | ICD-10-CM

## 2012-12-16 DIAGNOSIS — C50419 Malignant neoplasm of upper-outer quadrant of unspecified female breast: Secondary | ICD-10-CM

## 2012-12-16 HISTORY — DX: Malignant neoplasm of unspecified site of unspecified female breast: C50.919

## 2012-12-16 LAB — CBC WITH DIFFERENTIAL/PLATELET
BASO%: 0.3 % (ref 0.0–2.0)
Basophils Absolute: 0 10*3/uL (ref 0.0–0.1)
EOS%: 0.7 % (ref 0.0–7.0)
HCT: 39 % (ref 34.8–46.6)
HGB: 12.5 g/dL (ref 11.6–15.9)
MCHC: 32.1 g/dL (ref 31.5–36.0)
MONO#: 0.6 10*3/uL (ref 0.1–0.9)
NEUT%: 76.6 % (ref 38.4–76.8)
RDW: 13.8 % (ref 11.2–14.5)
WBC: 9.4 10*3/uL (ref 3.9–10.3)
lymph#: 1.5 10*3/uL (ref 0.9–3.3)

## 2012-12-16 LAB — COMPREHENSIVE METABOLIC PANEL (CC13)
ALT: 16 U/L (ref 0–55)
AST: 14 U/L (ref 5–34)
Albumin: 3.6 g/dL (ref 3.5–5.0)
CO2: 29 mEq/L (ref 22–29)
Calcium: 9.6 mg/dL (ref 8.4–10.4)
Chloride: 104 mEq/L (ref 98–107)
Creatinine: 1.1 mg/dL (ref 0.6–1.1)
Potassium: 3.6 mEq/L (ref 3.5–5.1)

## 2012-12-16 NOTE — Patient Instructions (Addendum)
Doing well.  Plan to follow up with Dr. Welton Flakes in 6 months or sooner if needed.

## 2012-12-16 NOTE — Progress Notes (Signed)
OFFICE PROGRESS NOTE  CC  MANN, [place], [place] [address] [place] [address]  DIAGNOSIS:  Tiffany Hanson is a 67 year old  woman, who underwent annual screening mammography in 07/08/2007, which showed a possible mass in the right breast.  Spot compression views showed a low density nodule and six month follow up was recommended.  She did not return for a follow-up appointment and had bilateral diagnostic mammograms and right breast ultrasound on 07/27/2008.  There was a spiculated mass in the right upper outer quadrant, which on ultrasound measured 1 x 1 x 0.9 cm.  A biopsy on 07/28/2008 revealed an invasive ductal carcinoma, high grade ER, PR negative and HER2 negative.  An MRI scan on 08/04/2008 confirmed a 1.5 x 1.4 x 1.3 cm invasive ductal carcinoma.    PRIOR THERAPY: She underwent a lumpectomy and sentinel lymph node biopsy on 08/20/2008, which showed 1.4 cm invasive ductal carcinoma with perineural invasion.  The anterior margin was close with DCIS at 0.2 cm away from the inked margin, but ultimately negative.  Sentinel lymph nodes were negative for metastatic disease.  She underwent adjuvant chemotherapy with 4 cycles of Taxotere and Cytoxan completed on 11/30/08.  She then completed radiation on 01/29/09.     CURRENT THERAPY:  Close surveillance with annual mammography and provider visits every six months.  INTERVAL HISTORY: Tiffany Hanson 67 y.o. female returns for continued follow up.  She reports no new complaints since her last visit in September of 2013.  She reports intermittent hot flashes that are tolerable.  She reports good control with her diabetes, checking her blood sugar on a daily basis.    MEDICAL HISTORY: Past Medical History  Diagnosis Date  . Breast cancer 12/16/2012  . Diabetes mellitus without complication   . Hypertension   . Hypercholesterolemia   . GERD (gastroesophageal reflux disease)   . S/P radiation therapy     completed in April  2010  . Osteopenia     ALLERGIES:  has No Known Allergies.  MEDICATIONS:  Current Outpatient Prescriptions  Medication Sig Dispense Refill  . amLODipine (NORVASC) 5 MG tablet Take 5 mg by mouth daily.      . Cholecalciferol (VITAMIN D-3) 5000 UNITS TABS Take 1 tablet by mouth daily.      . fish oil-omega-3 fatty acids 1000 MG capsule Take 1,200 mg by mouth daily.      Marland Kitchen glimepiride (AMARYL) 1 MG tablet Take 1 mg by mouth daily before breakfast.       . loratadine (CLARITIN) 10 MG tablet Take 10 mg by mouth daily.      . pantoprazole (PROTONIX) 40 MG tablet Take 40 mg by mouth daily.      . simvastatin (ZOCOR) 40 MG tablet Take 20 mg by mouth every evening.       No current facility-administered medications for this visit.    SURGICAL HISTORY:  Past Surgical History  Procedure Laterality Date  . Breast lumpectomy  08/20/08  . Tubal ligation  1993  . Bladder suspension      16 to 17 years ago    GYNECOLOGICAL HISTORY:  First menstrual cycle at age 34 and menopause at age 21.  G3 P3 with first live birth at age 20.Three children: son age 50, daughter age 59, and daughter age 9.    SOCIAL HISTORY:  She is married and is retired from [employer].  She has three children and six grand children.  REVIEW OF SYSTEMS:  Constitutional:  Feels well.  Cardiovascular: No chest pain, shortness of breath, or edema.  Pulmonary: No cough or wheeze.  Gastrointestinal: No nausea, vomiting, or diarrhea. No bright red blood per rectum or change in bowel movement.  Genitourinary: No frequency, urgency, or dysuria. No vaginal bleeding or discharge.  Musculoskeletal: No myalgia or joint pain. Neurologic: No weakness, numbness, or change in gait.  Psychology: No depression, anxiety, or insomnia.  HEALTH MAINTENANCE: Mammogram:  08/2012 Colonoscopy: 2009 Bone Density:  09/2012 Pap Smear: 2012 with Dr. Emelda Fear Vitamin D: 07/23/12 Lipid Panel: Managed by B. Mann, PA  PHYSICAL  EXAMINATION: Blood pressure 135/87, pulse 72, temperature 97.4 F (36.3 C), temperature source Oral, resp. rate 20, height 5' 5.5" (1.664 m), weight 188 lb 1.6 oz (85.322 kg). Body mass index is 30.81 kg/(m^2). ECOG PERFORMANCE STATUS: 1 - Symptomatic but completely ambulatory General: Well developed, well nourished female in no acute distress. Alert and oriented x 3.  Head/Neck: Oropharynx clear.  Sclerae anicteric.  Supple without any enlargements.  Lymph node survey: No cervical, supraclavicular, or axillary adenopathy  Cardiovascular: Regular rate and rhythm. S1 and S2 normal.  Lungs: Clear to auscultation bilaterally. No wheezes/crackles/rhonchi noted.  Skin: No rashes or lesions present. Back: No CVA tenderness.  Abdomen: Abdomen soft, non-tender and obese. Active bowel sounds in all quadrants. No evidence of a fluid wave or abdominal masses.  Breasts:  Right lumpectomy scar noted.  Inspection negative with no nodularity, erythema, masses, or discharge noted bilaterally.   Extremities: No bilateral cyanosis, edema, or clubbing.   LABORATORY DATA: Lab Results  Component Value Date   WBC 9.4 12/16/2012   HGB 12.5 12/16/2012   HCT 39.0 12/16/2012   MCV 87.3 12/16/2012   PLT 275 12/16/2012      Chemistry      Component Value Date/Time   NA 142 12/16/2012 1412   NA 139 01/05/2012 1232   K 3.6 12/16/2012 1412   K 3.8 01/05/2012 1232   CL 104 12/16/2012 1412   CL 103 01/05/2012 1232   CO2 29 12/16/2012 1412   CO2 29 01/05/2012 1232   BUN 14.9 12/16/2012 1412   BUN 13 01/05/2012 1232   CREATININE 1.1 12/16/2012 1412   CREATININE 1.11* 01/05/2012 1232      Component Value Date/Time   CALCIUM 9.6 12/16/2012 1412   CALCIUM 9.4 01/05/2012 1232   ALKPHOS 91 12/16/2012 1412   ALKPHOS 83 01/05/2012 1232   AST 14 12/16/2012 1412   AST 13 01/05/2012 1232   ALT 16 12/16/2012 1412   ALT 8 01/05/2012 1232   BILITOT 0.52 12/16/2012 1412   BILITOT 0.4 01/05/2012 1232      RADIOGRAPHIC STUDIES:  No results  found.  ASSESSMENT:  67 year old [place] woman: #1  S/p right lumpectomy and sentinel lymph node biopsy on 08/20/2008 for T1c N0, Stage I, ER/PR negative, Ki67 81%, HER2 negative, IDC of the right breast.   #2  She underwent adjuvant chemotherapy with 4 cycles of Taxotere and Cytoxan completed on 11/30/08.    #3  She then completed radiation on 01/29/09.      PLAN:  She remains without evidence of disease or recurrence since 2009.  She is to follow up with Dr. Welton Flakes in 6 months or sooner if problems arise.  Reportable signs and symptoms reviewed.    All questions were answered. The patient knows to call the clinic with any problems, questions or concerns. We can certainly see the patient much sooner if necessary.  I  spent 30 minutes counseling the patient face to face. The total time spent in the appointment was 60 minutes.  Warner Mccreedy, NP Medical/Oncology [facility]  [phone] (Office)  12/16/2012, 10:09 PM

## 2012-12-17 ENCOUNTER — Telehealth: Payer: Self-pay | Admitting: Oncology

## 2012-12-17 NOTE — Telephone Encounter (Signed)
, °

## 2012-12-18 ENCOUNTER — Telehealth: Payer: Self-pay | Admitting: Gynecologic Oncology

## 2012-12-18 NOTE — Telephone Encounter (Signed)
Message left asking the patient to please call the office to discuss bone density scan results and Dr. Milta Deiters recommendations for initiating Fosamax once weekly along with Caltrate plus D twice daily and Vitamin D3 2000 IU daily.

## 2012-12-18 NOTE — Telephone Encounter (Signed)
Attempted to call pt about bone density.

## 2012-12-20 ENCOUNTER — Ambulatory Visit: Payer: 59 | Admitting: Oncology

## 2012-12-20 ENCOUNTER — Other Ambulatory Visit: Payer: 59 | Admitting: Lab

## 2013-01-15 ENCOUNTER — Other Ambulatory Visit: Payer: Self-pay | Admitting: Emergency Medicine

## 2013-01-15 MED ORDER — ALENDRONATE SODIUM 35 MG PO TABS: 35.0000 mg | ORAL_TABLET | ORAL | Status: DC

## 2013-01-23 ENCOUNTER — Ambulatory Visit: Payer: BC Managed Care – PPO | Admitting: Oncology

## 2013-01-23 ENCOUNTER — Other Ambulatory Visit: Payer: BC Managed Care – PPO | Admitting: Lab

## 2013-03-13 ENCOUNTER — Other Ambulatory Visit: Payer: Self-pay | Admitting: Adult Health

## 2013-04-22 ENCOUNTER — Encounter: Payer: Self-pay | Admitting: *Deleted

## 2013-04-23 ENCOUNTER — Other Ambulatory Visit (HOSPITAL_COMMUNITY)
Admission: RE | Admit: 2013-04-23 | Discharge: 2013-04-23 | Disposition: A | Discharge status: Home / Self Care | Payer: Medicare Other | Source: Ambulatory Visit | Attending: Adult Health | Admitting: Adult Health

## 2013-04-23 ENCOUNTER — Ambulatory Visit (INDEPENDENT_AMBULATORY_CARE_PROVIDER_SITE_OTHER): Payer: Medicare Other | Admitting: Adult Health

## 2013-04-23 ENCOUNTER — Encounter: Payer: Self-pay | Admitting: Adult Health

## 2013-04-23 VITALS — BP 128/82 | HR 78 | Ht 60.0 in | Wt 187.0 lb

## 2013-04-23 DIAGNOSIS — Z1212 Encounter for screening for malignant neoplasm of rectum: Secondary | ICD-10-CM

## 2013-04-23 DIAGNOSIS — Z01419 Encounter for gynecological examination (general) (routine) without abnormal findings: Secondary | ICD-10-CM | POA: Insufficient documentation

## 2013-04-23 DIAGNOSIS — I10 Essential (primary) hypertension: Secondary | ICD-10-CM | POA: Insufficient documentation

## 2013-04-23 DIAGNOSIS — E119 Type 2 diabetes mellitus without complications: Secondary | ICD-10-CM | POA: Insufficient documentation

## 2013-04-23 DIAGNOSIS — Z1151 Encounter for screening for human papillomavirus (HPV): Secondary | ICD-10-CM | POA: Insufficient documentation

## 2013-04-23 DIAGNOSIS — L80 Vitiligo: Secondary | ICD-10-CM | POA: Insufficient documentation

## 2013-04-23 DIAGNOSIS — N904 Leukoplakia of vulva: Secondary | ICD-10-CM

## 2013-04-23 HISTORY — DX: Leukoplakia of vulva: N90.4

## 2013-04-23 LAB — HEMOCCULT GUIAC POC 1CARD (OFFICE): Fecal Occult Blood, POC: NEGATIVE

## 2013-04-23 NOTE — Patient Instructions (Addendum)
Return in 1 week for vulva bx Physical in 1 year Mammogram yearly Colonoscopy now

## 2013-04-23 NOTE — Progress Notes (Signed)
Patient ID: Tiffany Hanson, female   DOB: [birth year], 67 y.o.   MRN: [id number] History of Present Illness: Tiffany Hanson is a 67 year old black female married in for a pap and physical.   Current Medications, Allergies, Past Medical History, Past Surgical History, Family History and Social History were reviewed in [employer] electronic medical record.     Review of Systems: Patient denies any headaches, blurred vision, shortness of breath, chest pain, abdominal pain, problems with bowel movements, urination, or intercourse. She is not having sex,no joint pain or swelling, no mood swings.She did say she needed some cream for her bottom, but it has been almost 4 years since she was here.    Physical Exam:BP 128/82  Pulse 78  Ht 5' (1.524 m)  Wt 187 lb (84.823 kg)  BMI 36.52 kg/m2 General:  Well developed, well nourished, no acute distress Skin:  Warm and dry Neck:  Midline trachea, normal thyroid, no carotid bruits heard Lungs; Clear to auscultation bilaterally Breast:  No dominant palpable mass, retraction, or nipple discharge, she has a firmness at 10 o'clock in the right breast that correlates to a seroma seen on her last mammogram and her scar is well healed. Cardiovascular: Regular rate and rhythm Abdomen:  Soft, non tender, no hepatosplenomegaly Pelvic:  External genitalia has vitiligo and an area of leukoplakia on the left labia that is firm to touch, Dr Despina Hidden in to co-exam,  The vagina is atrophic in appearance. The cervix is bulbous.Pap performed with HPV.  Uterus is felt to be normal size, shape, and contour.  No  adnexal masses or tenderness noted. Rectal: Good sphincter tone, no polyps, or hemorrhoids felt.  Hemoccult negative.+rectocele Extremities:  No swelling  noted Psych:  Alert and cooperative, seems happy, says she is ready to travel   Impression: Yearly gyn exam Vitiligo Leukoplakia,vulva Diabetes Hypertension   Plan: Physical in 1 year Return in 1 week for Dr  Despina Hidden to biopsy the left vulva Labs at PCP

## 2013-04-30 ENCOUNTER — Other Ambulatory Visit: Payer: Self-pay | Admitting: Obstetrics & Gynecology

## 2013-04-30 ENCOUNTER — Encounter: Payer: Self-pay | Admitting: Obstetrics & Gynecology

## 2013-04-30 ENCOUNTER — Ambulatory Visit (INDEPENDENT_AMBULATORY_CARE_PROVIDER_SITE_OTHER): Payer: Medicare Other | Admitting: Obstetrics & Gynecology

## 2013-04-30 VITALS — BP 100/60 | Wt 185.0 lb

## 2013-04-30 DIAGNOSIS — N904 Leukoplakia of vulva: Secondary | ICD-10-CM

## 2013-04-30 NOTE — Progress Notes (Signed)
Patient ID: Tiffany Hanson, female   DOB: 27-Oct-1946, 67 y.o.   MRN: [id number]  Chief Complaint  Patient presents with  . VULVAR BIOPSY    AREA ON LT LABIA.    HPI Tiffany Hanson is a 67 y.o. female.   HPI Indication: leukoplakia of the vulva Symptoms:  none  Location: Left peri clitoral area  Past Medical History  Diagnosis Date  . Breast cancer 12/16/2012  . Diabetes mellitus without complication   . Hypertension   . Hypercholesterolemia   . GERD (gastroesophageal reflux disease)   . S/P radiation therapy     completed in April 2010  . Osteopenia   . Abnormal Pap smear   . Vitiligo   . Leukoplakia, 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    Past Surgical History  Procedure Laterality Date  . Breast lumpectomy  08/20/08  . Tubal ligation  1993  . Bladder suspension      16 to 17 years ago  . Hysteroscopy      Family History  Problem Relation Age of Onset  . Heart attack Mother   . Diabetes Mother   . Cancer Maternal Aunt     breast  . Cancer Paternal Aunt     breast  . Stroke Father   . Heart disease Father   . Lupus Sister     Social History History  Substance Use Topics  . Smoking status: Never Smoker   . Smokeless tobacco: Never Used  . Alcohol Use: No    No Known Allergies  Current Outpatient Prescriptions  Medication Sig Dispense Refill  . alendronate (FOSAMAX) 35 MG tablet Take 1 tablet (35 mg total) by mouth every 7 (seven) days. Take with a full glass of water on an empty stomach.  5 tablet  11  . amLODipine (NORVASC) 5 MG tablet Take 5 mg by mouth daily.      . Cholecalciferol (VITAMIN D-3) 5000 UNITS TABS Take 1 tablet by mouth daily.      . fish oil-omega-3 fatty acids 1000 MG capsule Take 1,200 mg by mouth daily.      Marland Kitchen glimepiride (AMARYL) 1 MG tablet Take 1 mg by mouth daily before breakfast.       . loratadine (CLARITIN) 10 MG tablet Take 10 mg by mouth daily.      . pantoprazole (PROTONIX) 40 MG tablet Take 40 mg by mouth daily.      . simvastatin  (ZOCOR) 40 MG tablet Take 20 mg by mouth every evening.      . tretinoin (RETIN-A) 0.025 % cream Apply topically at bedtime.        No current facility-administered medications for this visit.    Review of Systems Review of Systems Negative except per HPI  Blood pressure 100/60, weight 185 lb (83.915 kg).  Physical Exam Physical Exam Per visit  Data Reviewed   Assessment    Prepping with Betadine Local anesthesia with 1% Buffered Lidocaine 4 mm punch biopsy performed per protocol Figure of 8 suture placed Well tolerated  Specimen appropriately identified and sent to pathology    Plan    Follow-up:  1 weeks      Beatrice Ziehm H 04/30/2013, 10:13 AM

## 2013-04-30 NOTE — Patient Instructions (Signed)
Vulva Biopsy A vulva biopsy is a procedure where a small piece of tissue is taken from the vulva, or outside of the female genital area. The vulva includes the folds of skin (labia), the clitoris, and the openings of the urethra and vagina. This sample is taken to obtain more information or a diagnosis regarding a lesion, growth, rash, blister, or some abnormal discoloration. It can also be done to remove an unwanted mole or wart. LET YOUR CAREGIVER KNOW ABOUT:  Any allergies to food or medicine.  Medicines taken, including vitamins, herbs, eyedrops, and over-the-counter medicines and creams.  Use of steroids (by mouth or creams).  Previous problems with anesthetics or numbing medicines.  History of bleeding problems or blood clots.  Previous surgery.  Other health problems, including diabetes and kidney problems.  Possibility of pregnancy, if this applies. RISKS AND COMPLICATIONS Generally, vulva biopsy is a safe procedure. However, as with any surgical procedure, complications can occur. Possible complications include:  Bleeding from the biopsy site.  Infection.  Injury to organs or structures near the biopsy site.  Long-lasting (chronic) pain at the biopsy site. BEFORE THE PROCEDURE  Wear loose and comfortable pants and underwear to the hospital or clinic. This will lessen rubbing on the biopsy site once the procedure is finished.  Bring sanitary pads and panty liners to use after the procedure. PROCEDURE  The biopsy site will be cleaned with an antiseptic. You will be given an injection of an anesthetic medicine in the biopsy site using a needle. A small tissue sample will be removed (excised). This sample may be sent for further examination depending on why you are having a biopsy. A medicine may be applied to the biopsy site to help stop the bleeding. Finally, the biopsy site may be closed with dissolvable stitches (sutures).  AFTER THE PROCEDURE  You may be given pain  medicine to help with any discomfort.  You may notice a small amount of bleeding from the biopsy area. This is normal. Wear a sanitary pad.  Do not rub the biopsy area after urinating. Gently pat the area dry or use a bottle filled with warm water (peri-bottle) to clean the area.  Your sutures should dissolve within one week or may need to be removed. Document Released: 10/02/2012 Document Reviewed: 08/12/2012 [employer] Patient Information 2014 [employer].  

## 2013-05-09 ENCOUNTER — Ambulatory Visit (INDEPENDENT_AMBULATORY_CARE_PROVIDER_SITE_OTHER): Payer: Medicare Other | Admitting: Adult Health

## 2013-05-09 ENCOUNTER — Encounter: Payer: Self-pay | Admitting: Adult Health

## 2013-05-09 VITALS — BP 120/80 | Ht 60.0 in | Wt 184.0 lb

## 2013-05-09 DIAGNOSIS — L900 Lichen sclerosus et atrophicus: Secondary | ICD-10-CM

## 2013-05-09 DIAGNOSIS — N904 Leukoplakia of vulva: Secondary | ICD-10-CM

## 2013-05-09 DIAGNOSIS — L940 Localized scleroderma [morphea]: Secondary | ICD-10-CM

## 2013-05-09 HISTORY — DX: Lichen sclerosus et atrophicus: L90.0

## 2013-05-09 MED ORDER — CLOBETASOL PROPIONATE 0.05 % EX CREA: TOPICAL_CREAM | Freq: Two times a day (BID) | CUTANEOUS | Status: DC

## 2013-05-09 MED ORDER — CLOBETASOL PROPIONATE 0.05 % EX CREA: TOPICAL_CREAM | CUTANEOUS | Status: DC

## 2013-05-09 NOTE — Patient Instructions (Addendum)
Path showed lichen sclerosus Use clobetasol bid x 2 weeks the bid 2-3 x weekly  Follow up in 6 months

## 2013-05-09 NOTE — Progress Notes (Signed)
Subjective:     Patient ID: Tiffany Hanson, female   DOB: 30-Oct-1946, 67 y.o.   MRN: [id number]  HPI  Tangia is back today to review path report from recent vulva biopsy. No complaints today.  Review of Systems See HPI Reviewed past medical,surgical, social and family history. Reviewed medications and allergies.     Objective:   Physical Exam BP 120/80  Ht 5' (1.524 m)  Wt 184 lb (83.462 kg)  BMI 35.94 kg/m2   Skin warm and dry, biopsy site healing, has leukoplakia on vulva and vitilgo.The biopsy showed lichen sclerosus, and I discussed with her that this is a chronic condition and showed her pictures.will treat with clobetasol cream.  Assessment:      Lichen sclerosus   Leukoplakia of vulva  Plan:     Rx Clobetasol cream 0.05% bid x 2 weeks then bid 2-3 x weekly Follow up in 6 months, prn problems

## 2013-05-09 NOTE — Assessment & Plan Note (Signed)
Pathology showed lichen sclerosus, will treat with clobetasol 0.05% cream bid

## 2013-05-12 ENCOUNTER — Telehealth: Payer: Self-pay | Admitting: Adult Health

## 2013-05-12 NOTE — Telephone Encounter (Signed)
Pharmacy calling to verify RX for clobetasol 0.05% bid x 2 weeks then bid 2-3 times per week per Cyril Mourning, NP

## 2013-06-10 ENCOUNTER — Telehealth: Payer: Self-pay | Admitting: *Deleted

## 2013-06-10 NOTE — Telephone Encounter (Signed)
R/S appt with Dr. Welton Flakes to 07/03/13 at 2:45.  Confirmed appt date and time.  Pt denies further needs at this time.

## 2013-06-18 ENCOUNTER — Ambulatory Visit: Payer: 59 | Admitting: Oncology

## 2013-06-18 ENCOUNTER — Other Ambulatory Visit: Payer: 59 | Admitting: Lab

## 2013-07-03 ENCOUNTER — Ambulatory Visit (HOSPITAL_BASED_OUTPATIENT_CLINIC_OR_DEPARTMENT_OTHER): Payer: Medicare Other | Admitting: Oncology

## 2013-07-03 ENCOUNTER — Telehealth: Payer: Self-pay | Admitting: Oncology

## 2013-07-03 ENCOUNTER — Encounter: Payer: Self-pay | Admitting: Oncology

## 2013-07-03 VITALS — BP 110/74 | HR 76 | Temp 98.5°F | Resp 20 | Ht 60.0 in | Wt 187.2 lb

## 2013-07-03 DIAGNOSIS — C50919 Malignant neoplasm of unspecified site of unspecified female breast: Secondary | ICD-10-CM

## 2013-07-03 DIAGNOSIS — C50911 Malignant neoplasm of unspecified site of right female breast: Secondary | ICD-10-CM

## 2013-07-03 NOTE — Telephone Encounter (Signed)
, °

## 2013-07-03 NOTE — Patient Instructions (Addendum)
Doing well, no evidence of recurrent cancer  I will see you back in 6 months

## 2013-07-03 NOTE — Progress Notes (Signed)
OFFICE PROGRESS NOTE  CC  MANN, BENJAMIN, PA-C [address] [place] [address]  DIAGNOSIS:  Tiffany Hanson is a 67 year old [place] woman, who underwent annual screening mammography in 07/08/2007, which showed a possible mass in the right breast.  Spot compression views showed a low density nodule and six month follow up was recommended.  She did not return for a follow-up appointment and had bilateral diagnostic mammograms and right breast ultrasound on 07/27/2008.  There was a spiculated mass in the right upper outer quadrant, which on ultrasound measured 1 x 1 x 0.9 cm.  A biopsy on 07/28/2008 revealed an invasive ductal carcinoma, high grade ER, PR negative and HER2 negative.  An MRI scan on 08/04/2008 confirmed a 1.5 x 1.4 x 1.3 cm invasive ductal carcinoma.    PRIOR THERAPY: She underwent a lumpectomy and sentinel lymph node biopsy on 08/20/2008, which showed 1.4 cm invasive ductal carcinoma with perineural invasion.  The anterior margin was close with DCIS at 0.2 cm away from the inked margin, but ultimately negative.  Sentinel lymph nodes were negative for metastatic disease.  She underwent adjuvant chemotherapy with 4 cycles of Taxotere and Cytoxan completed on 11/30/08.  She then completed radiation on 01/29/09.     CURRENT THERAPY:  observation  INTERVAL HISTORY: Tiffany Hanson 67 y.o. female returns for continued follow up.  She reports no new complaints since her last visit.  She reports intermittent hot flashes that are tolerable.  She reports good control with her diabetes, checking her blood sugar on a daily basis.    MEDICAL HISTORY: Past Medical History  Diagnosis Date  . Breast cancer 12/16/2012  . Diabetes mellitus without complication   . Hypertension   . Hypercholesterolemia   . GERD (gastroesophageal reflux disease)   . S/P radiation therapy     completed in April 2010  . Osteopenia   . Abnormal Pap smear   . Vitiligo   . Leukoplakia, vulva  04/23/2013  . Lichen sclerosus 05/09/2013    ALLERGIES:  has No Known Allergies.  MEDICATIONS:  Current Outpatient Prescriptions  Medication Sig Dispense Refill  . alendronate (FOSAMAX) 35 MG tablet Take 1 tablet (35 mg total) by mouth every 7 (seven) days. Take with a full glass of water on an empty stomach.  5 tablet  11  . amLODipine (NORVASC) 5 MG tablet Take 5 mg by mouth daily.      . Cholecalciferol (VITAMIN D-3) 5000 UNITS TABS Take 1 tablet by mouth daily.      . clobetasol cream (TEMOVATE) 0.05 % Use bid x 2 weeks then  bid 2-3 x per  week  30 g  6  . fish oil-omega-3 fatty acids 1000 MG capsule Take 1,200 mg by mouth daily.      Marland Kitchen glimepiride (AMARYL) 1 MG tablet Take 1 mg by mouth daily before breakfast.       . loratadine (CLARITIN) 10 MG tablet Take 10 mg by mouth daily.      . pantoprazole (PROTONIX) 40 MG tablet Take 40 mg by mouth daily.      . simvastatin (ZOCOR) 40 MG tablet Take 20 mg by mouth every evening.      . tretinoin (RETIN-A) 0.025 % cream Apply topically at bedtime.        No current facility-administered medications for this visit.    SURGICAL HISTORY:  Past Surgical History  Procedure Laterality Date  . Breast lumpectomy  08/20/08  . Tubal ligation  1993  .  Bladder suspension      16 to 17 years ago  . Hysteroscopy      GYNECOLOGICAL HISTORY:  First menstrual cycle at age 58 and menopause at age 72.  G3 P3 with first live birth at age 78.Three children: son age 5, daughter age 35, and daughter age 92.    SOCIAL HISTORY:  She is married and is retired from [employer].  She has three children and six grand children.  REVIEW OF SYSTEMS:  Constitutional: Feels well.  Cardiovascular: No chest pain, shortness of breath, or edema.  Pulmonary: No cough or wheeze.  Gastrointestinal: No nausea, vomiting, or diarrhea. No bright red blood per rectum or change in bowel movement.  Genitourinary: No frequency, urgency, or dysuria. No vaginal bleeding or  discharge.  Musculoskeletal: No myalgia or joint pain. Neurologic: No weakness, numbness, or change in gait.  Psychology: No depression, anxiety, or insomnia.  HEALTH MAINTENANCE: Mammogram:  08/2012 Colonoscopy: 2009 Bone Density:  09/2012 Pap Smear: 2012 with Dr. Emelda Fear Vitamin D: 07/23/12 Lipid Panel: Managed by B. Mann, PA  PHYSICAL EXAMINATION: Blood pressure 110/74, pulse 76, temperature 98.5 F (36.9 C), temperature source Oral, resp. rate 20, height 5' (1.524 m), weight 187 lb 3.2 oz (84.913 kg). Body mass index is 36.56 kg/(m^2). ECOG PERFORMANCE STATUS: 1 - Symptomatic but completely ambulatory General: Well developed, well nourished female in no acute distress. Alert and oriented x 3.  Head/Neck: Oropharynx clear.  Sclerae anicteric.  Supple without any enlargements.  Lymph node survey: No cervical, supraclavicular, or axillary adenopathy  Cardiovascular: Regular rate and rhythm. S1 and S2 normal.  Lungs: Clear to auscultation bilaterally. No wheezes/crackles/rhonchi noted.  Skin: No rashes or lesions present. Back: No CVA tenderness.  Abdomen: Abdomen soft, non-tender and obese. Active bowel sounds in all quadrants. No evidence of a fluid wave or abdominal masses.  Breasts:  Right lumpectomy scar noted.  Inspection negative with no nodularity, erythema, masses, or discharge noted bilaterally.   Extremities: No bilateral cyanosis, edema, or clubbing.   LABORATORY DATA: Lab Results  Component Value Date   WBC 9.4 12/16/2012   HGB 12.5 12/16/2012   HCT 39.0 12/16/2012   MCV 87.3 12/16/2012   PLT 275 12/16/2012      Chemistry      Component Value Date/Time   NA 142 12/16/2012 1412   NA 139 01/05/2012 1232   K 3.6 12/16/2012 1412   K 3.8 01/05/2012 1232   CL 104 12/16/2012 1412   CL 103 01/05/2012 1232   CO2 29 12/16/2012 1412   CO2 29 01/05/2012 1232   BUN 14.9 12/16/2012 1412   BUN 13 01/05/2012 1232   CREATININE 1.1 12/16/2012 1412   CREATININE 1.11* 01/05/2012 1232       Component Value Date/Time   CALCIUM 9.6 12/16/2012 1412   CALCIUM 9.4 01/05/2012 1232   ALKPHOS 91 12/16/2012 1412   ALKPHOS 83 01/05/2012 1232   AST 14 12/16/2012 1412   AST 13 01/05/2012 1232   ALT 16 12/16/2012 1412   ALT 8 01/05/2012 1232   BILITOT 0.52 12/16/2012 1412   BILITOT 0.4 01/05/2012 1232      RADIOGRAPHIC STUDIES:  No results found.  ASSESSMENT:  67 year old [place] woman: #1  S/p right lumpectomy and sentinel lymph node biopsy on 08/20/2008 for T1c N0, Stage I, ER/PR negative, Ki67 81%, HER2 negative, IDC of the right breast.   #2  She underwent adjuvant chemotherapy with 4 cycles of Taxotere and Cytoxan completed on  11/30/08.    #3  She then completed radiation on 01/29/09.     #4 patient has no evidence of recurrent disease   PLAN:    #1 patient will be seen back in one years time for followup  All questions were answered. The patient knows to call the clinic with any problems, questions or concerns. We can certainly see the patient much sooner if necessary.  I spent 15 minutes counseling the patient face to face. The total time spent in the appointment was 15 minutes.

## 2013-09-04 ENCOUNTER — Other Ambulatory Visit (HOSPITAL_COMMUNITY): Payer: Self-pay | Admitting: Physician Assistant

## 2013-09-04 DIAGNOSIS — C50911 Malignant neoplasm of unspecified site of right female breast: Secondary | ICD-10-CM

## 2013-10-01 ENCOUNTER — Ambulatory Visit (HOSPITAL_COMMUNITY)
Admission: RE | Admit: 2013-10-01 | Discharge: 2013-10-01 | Disposition: A | Discharge status: Home / Self Care | Payer: Medicare Other | Source: Ambulatory Visit | Attending: Physician Assistant | Admitting: Physician Assistant

## 2013-10-01 DIAGNOSIS — C50911 Malignant neoplasm of unspecified site of right female breast: Secondary | ICD-10-CM

## 2013-10-01 DIAGNOSIS — Z853 Personal history of malignant neoplasm of breast: Secondary | ICD-10-CM | POA: Insufficient documentation

## 2013-11-24 ENCOUNTER — Ambulatory Visit (INDEPENDENT_AMBULATORY_CARE_PROVIDER_SITE_OTHER): Payer: Medicare HMO | Admitting: Adult Health

## 2013-11-24 ENCOUNTER — Encounter: Payer: Self-pay | Admitting: Adult Health

## 2013-11-24 VITALS — BP 110/68 | Ht 65.0 in | Wt 184.0 lb

## 2013-11-24 DIAGNOSIS — L900 Lichen sclerosus et atrophicus: Secondary | ICD-10-CM

## 2013-11-24 DIAGNOSIS — L940 Localized scleroderma [morphea]: Secondary | ICD-10-CM

## 2013-11-24 DIAGNOSIS — L80 Vitiligo: Secondary | ICD-10-CM

## 2013-11-24 DIAGNOSIS — E669 Obesity, unspecified: Secondary | ICD-10-CM | POA: Insufficient documentation

## 2013-11-24 NOTE — Progress Notes (Signed)
Subjective:     Patient ID: Tiffany Hanson, female   DOB: 1946-04-25, 68 y.o.   MRN: [id number]  HPI Tiffany Hanson is a 68 year old black female in for follow up of LSA, confirmed by biopsy.Tiffany Hanson doing ok but had another spot come up and she is using temovate and doing ok. She is doing WHOLE 30 an has lost 4-5 lbs.  Review of Systems See HPI Reviewed past medical,surgical, social and family history. Reviewed medications and allergies.     Objective:   Physical Exam BP 110/68  Ht 5\' 5"  (1.651 m)  Wt 184 lb (83.462 kg)  BMI 30.62 kg/m2   Has vitiligo on external genitalia and lichen sclerosus, seems stable, no new lesions seen  Assessment:     Lichen sclerosus Vitiligo     Plan:     Continue temovate Follow up in 6 months, call prn

## 2013-11-24 NOTE — Patient Instructions (Signed)
Follow up in 6 months  Continue temovate Call prn

## 2013-12-19 ENCOUNTER — Other Ambulatory Visit: Payer: Medicare Other

## 2013-12-19 ENCOUNTER — Ambulatory Visit: Payer: Medicare Other | Admitting: Oncology

## 2014-02-03 ENCOUNTER — Emergency Department (HOSPITAL_COMMUNITY): Payer: Medicare HMO

## 2014-02-03 ENCOUNTER — Emergency Department (HOSPITAL_COMMUNITY)
Admission: EM | Admit: 2014-02-03 | Discharge: 2014-02-03 | Disposition: A | Discharge status: Home / Self Care | Payer: Medicare HMO | Attending: Emergency Medicine | Admitting: Emergency Medicine

## 2014-02-03 ENCOUNTER — Encounter (HOSPITAL_COMMUNITY): Payer: Self-pay | Admitting: Emergency Medicine

## 2014-02-03 DIAGNOSIS — Z872 Personal history of diseases of the skin and subcutaneous tissue: Secondary | ICD-10-CM | POA: Insufficient documentation

## 2014-02-03 DIAGNOSIS — K219 Gastro-esophageal reflux disease without esophagitis: Secondary | ICD-10-CM | POA: Insufficient documentation

## 2014-02-03 DIAGNOSIS — Z79899 Other long term (current) drug therapy: Secondary | ICD-10-CM | POA: Insufficient documentation

## 2014-02-03 DIAGNOSIS — W010XXA Fall on same level from slipping, tripping and stumbling without subsequent striking against object, initial encounter: Secondary | ICD-10-CM | POA: Insufficient documentation

## 2014-02-03 DIAGNOSIS — Z853 Personal history of malignant neoplasm of breast: Secondary | ICD-10-CM | POA: Insufficient documentation

## 2014-02-03 DIAGNOSIS — Y9289 Other specified places as the place of occurrence of the external cause: Secondary | ICD-10-CM | POA: Insufficient documentation

## 2014-02-03 DIAGNOSIS — I10 Essential (primary) hypertension: Secondary | ICD-10-CM | POA: Insufficient documentation

## 2014-02-03 DIAGNOSIS — E119 Type 2 diabetes mellitus without complications: Secondary | ICD-10-CM | POA: Insufficient documentation

## 2014-02-03 DIAGNOSIS — Z8719 Personal history of other diseases of the digestive system: Secondary | ICD-10-CM | POA: Insufficient documentation

## 2014-02-03 DIAGNOSIS — Y9302 Activity, running: Secondary | ICD-10-CM | POA: Insufficient documentation

## 2014-02-03 DIAGNOSIS — E7800 Pure hypercholesterolemia, unspecified: Secondary | ICD-10-CM | POA: Insufficient documentation

## 2014-02-03 DIAGNOSIS — Z8742 Personal history of other diseases of the female genital tract: Secondary | ICD-10-CM | POA: Insufficient documentation

## 2014-02-03 DIAGNOSIS — S93509A Unspecified sprain of unspecified toe(s), initial encounter: Secondary | ICD-10-CM

## 2014-02-03 DIAGNOSIS — S93609A Unspecified sprain of unspecified foot, initial encounter: Secondary | ICD-10-CM | POA: Insufficient documentation

## 2014-02-03 MED ORDER — HYDROCODONE-ACETAMINOPHEN 5-325 MG PO TABS: 1.0000 | ORAL_TABLET | ORAL | Status: DC | PRN

## 2014-02-03 NOTE — Discharge Instructions (Signed)
Your x-ray is negative for fracture or dislocation. Please apply ice to the foot tonight and tomorrow. Please elevate the foot above your waist is much as possible. Please use Tylenol Extra Strength for mild pain, use Norco for more severe pain. Norco may cause drowsiness, please use with caution. Foot Sprain The muscles and cord like structures which attach muscle to bone (tendons) that surround the feet are made up of units. A foot sprain can occur at the weakest spot in any of these units. This condition is most often caused by injury to or overuse of the foot, as from playing contact sports, or aggravating a previous injury, or from poor conditioning, or obesity. SYMPTOMS  Pain with movement of the foot.  Tenderness and swelling at the injury site.  Loss of strength is present in moderate or severe sprains. THE THREE GRADES OR SEVERITY OF FOOT SPRAIN ARE:  Mild (Grade I): Slightly pulled muscle without tearing of muscle or tendon fibers or loss of strength.  Moderate (Grade II): Tearing of fibers in a muscle, tendon, or at the attachment to bone, with small decrease in strength.  Severe (Grade III): Rupture of the muscle-tendon-bone attachment, with separation of fibers. Severe sprain requires surgical repair. Often repeating (chronic) sprains are caused by overuse. Sudden (acute) sprains are caused by direct injury or over-use. DIAGNOSIS  Diagnosis of this condition is usually by your own observation. If problems continue, a caregiver may be required for further evaluation and treatment. X-rays may be required to make sure there are not breaks in the bones (fractures) present. Continued problems may require physical therapy for treatment. PREVENTION  Use strength and conditioning exercises appropriate for your sport.  Warm up properly prior to working out.  Use athletic shoes that are made for the sport you are participating in.  Allow adequate time for healing. Early return to  activities makes repeat injury more likely, and can lead to an unstable arthritic foot that can result in prolonged disability. Mild sprains generally heal in 3 to 10 days, with moderate and severe sprains taking 2 to 10 weeks. Your caregiver can help you determine the proper time required for healing. HOME CARE INSTRUCTIONS   Apply ice to the injury for 15-20 minutes, 03-04 times per day. Put the ice in a plastic bag and place a towel between the bag of ice and your skin.  An elastic wrap (like an Ace bandage) may be used to keep swelling down.  Keep foot above the level of the heart, or at least raised on a footstool, when swelling and pain are present.  Try to avoid use other than gentle range of motion while the foot is painful. Do not resume use until instructed by your caregiver. Then begin use gradually, not increasing use to the point of pain. If pain does develop, decrease use and continue the above measures, gradually increasing activities that do not cause discomfort, until you gradually achieve normal use.  Use crutches if and as instructed, and for the length of time instructed.  Keep injured foot and ankle wrapped between treatments.  Massage foot and ankle for comfort and to keep swelling down. Massage from the toes up towards the knee.  Only take over-the-counter or prescription medicines for pain, discomfort, or fever as directed by your caregiver. SEEK IMMEDIATE MEDICAL CARE IF:   Your pain and swelling increase, or pain is not controlled with medications.  You have loss of feeling in your foot or your foot turns cold  or blue.  You develop new, unexplained symptoms, or an increase of the symptoms that brought you to your caregiver. MAKE SURE YOU:   Understand these instructions.  Will watch your condition.  Will get help right away if you are not doing well or get worse. Document Released: 04/07/2002 Document Revised: 01/08/2012 Document Reviewed:  06/04/2008 [facility] Patient Information 2014 [place], [place].

## 2014-02-03 NOTE — ED Notes (Signed)
Pain to toes to right foot. Pt states she tripped and fell while on her porch earlier today.

## 2014-02-03 NOTE — ED Provider Notes (Signed)
CSN: [id number]     Arrival date & time 02/03/14  1632 History   First MD Initiated Contact with Patient 02/03/14 1719     Chief Complaint  Patient presents with  . Toe Pain     (Consider location/radiation/quality/duration/timing/severity/associated sxs/prior Treatment) HPI Comments: Patient is a 68 year old female who presents to the emergency department with complaint of right first and second toe pain. The patient states that she was running to catch her grandson when she tripped and fail on her porch. She injured the first and second toe of her right foot. The patient denies being on any anticoagulation medications. She denies any previous operations or procedures involving the right foot. She attempted to place ice on the area and use Tylenol but states that the pain got progressively worse especially when she would put weight on this area. She presents now for evaluation of the pain in her right foot, and in particular the toes of the right foot.  Patient is a 68 y.o. female presenting with toe pain. The history is provided by the patient.  Toe Pain This is a new problem. The current episode started today. The problem occurs constantly. The problem has been gradually worsening. Associated symptoms include arthralgias. Pertinent negatives include no abdominal pain, chest pain, coughing or neck pain. The symptoms are aggravated by standing and walking. She has tried acetaminophen for the symptoms. The treatment provided no relief.    Past Medical History  Diagnosis Date  . Breast cancer 12/16/2012  . Diabetes mellitus without complication   . Hypertension   . Hypercholesterolemia   . GERD (gastroesophageal reflux disease)   . S/P radiation therapy     completed in April 2010  . Osteopenia   . Abnormal Pap smear   . Vitiligo   . 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 Lichen sclerosus 05/05/2375   Past Surgical History  Procedure Laterality Date  . Breast lumpectomy  08/20/08  . Tubal  ligation  1993  . Bladder suspension      16 to 17 years ago  . Hysteroscopy     Family History  Problem Relation Age of Onset  . Heart attack Mother   . Diabetes Mother   . Cancer Maternal Aunt     breast  . Cancer Paternal Aunt     breast  . Stroke Father   . Heart disease Father   . Lupus Sister    History  Substance Use Topics  . Smoking status: Never Smoker   . Smokeless tobacco: Never Used  . Alcohol Use: No   OB History   Grav Para Term Preterm Abortions TAB SAB Ect Mult Living   3 3        3      Review of Systems  Constitutional: Negative for activity change.       All ROS Neg except as noted in HPI  HENT: Negative for nosebleeds.   Eyes: Negative for photophobia and discharge.  Respiratory: Negative for cough, shortness of breath and wheezing.   Cardiovascular: Negative for chest pain and palpitations.  Gastrointestinal: Negative for abdominal pain and blood in stool.  Genitourinary: Negative for dysuria, frequency and hematuria.  Musculoskeletal: Positive for arthralgias. Negative for back pain and neck pain.  Skin: Negative.   Neurological: Negative for dizziness, seizures and speech difficulty.  Psychiatric/Behavioral: Negative for hallucinations and confusion.      Allergies  Review of patient's allergies indicates no known allergies.  Home Medications   Current Outpatient Rx  Name  Route  Sig  Dispense  Refill  . alendronate (FOSAMAX) 35 MG tablet   Oral   Take 1 tablet (35 mg total) by mouth every 7 (seven) days. Take with a full glass of water on an empty stomach.   5 tablet   11   . amLODipine (NORVASC) 5 MG tablet   Oral   Take 5 mg by mouth daily.         . Cholecalciferol (VITAMIN D-3) 5000 UNITS TABS   Oral   Take 1 tablet by mouth daily.         . clobetasol cream (TEMOVATE) 0.05 %      Use bid x 2 weeks then  bid 2-3 x per  week   30 g   6   . fish oil-omega-3 fatty acids 1000 MG capsule   Oral   Take 1,200 mg by  mouth daily.         Marland Kitchen glimepiride (AMARYL) 1 MG tablet   Oral   Take 1 mg by mouth daily before breakfast.          . loratadine (CLARITIN) 10 MG tablet   Oral   Take 10 mg by mouth daily.         . pantoprazole (PROTONIX) 40 MG tablet   Oral   Take 40 mg by mouth daily.         . simvastatin (ZOCOR) 40 MG tablet   Oral   Take 20 mg by mouth every evening.         . tretinoin (RETIN-A) 0.025 % cream   Topical   Apply topically at bedtime.           BP 128/64  Pulse 70  Temp(Src) 98 F (36.7 C) (Oral)  Resp 18  Ht 5\' 5"  (1.651 m)  Wt 185 lb (83.915 kg)  BMI 30.79 kg/m2  SpO2 100% Physical Exam  Nursing note and vitals reviewed. Constitutional: She is oriented to person, place, and time. She appears well-developed and well-nourished.  Non-toxic appearance.  HENT:  Head: Normocephalic.  Right Ear: Tympanic membrane and external ear normal.  Left Ear: Tympanic membrane and external ear normal.  Eyes: EOM and lids are normal. Pupils are equal, round, and reactive to light.  Neck: Normal range of motion. Neck supple. Carotid bruit is not present.  Cardiovascular: Normal rate, regular rhythm, normal heart sounds, intact distal pulses and normal pulses.   Pulmonary/Chest: Breath sounds normal. No respiratory distress.  Abdominal: Soft. Bowel sounds are normal. There is no tenderness. There is no guarding.  Musculoskeletal: Normal range of motion.  There is mild swelling of the first and second toe of the right foot. There is a small bruise at the base of the second toe of the right foot. Capillary refill is less than 2 seconds. Sensory is intact. The dorsalis pedis pulse is 2+. In the posterior tibial pulses 2+. There is good range of motion of the ankle. And there is full range of motion of the right knee and hip.  Lymphadenopathy:       Head (right side): No submandibular adenopathy present.       Head (left side): No submandibular adenopathy present.    She  has no cervical adenopathy.  Neurological: She is alert and oriented to person, place, and time. She has normal strength. No cranial nerve deficit or sensory deficit.  Skin: Skin is warm and dry.  Psychiatric: She has a normal mood and affect.  Her speech is normal.    ED Course  Procedures (including critical care time) Labs Review Labs Reviewed - No data to display Imaging Review No results found.   EKG Interpretation None      MDM The patient was running after her grandchild earlier today when she stumbled and injured the right foot. She complains of pain of the right first and second toe. There is no evidence of dislocation. The dorsalis pedis pulses and posterior tibial pulses are 2+. The capillary refill is less than 2 seconds. X-ray of the right foot reveals some osteopenia, but no fracture or dislocation. There is some degenerative changes noted of the foot.  The patient is given an ice pack, and asked to use the ice pack to assist with swelling and discomfort. She is asked to use Tylenol Extra Strength for mild pain, she's given a prescription for Norco for more severe pain. She will follow with her primary physician if any changes or problems.    Final diagnoses:  None    *I have reviewed nursing notes, vital signs, and all appropriate lab and imaging results for this patient.Lenox Ahr, PA-C 02/03/14 [phone]

## 2014-02-04 NOTE — ED Provider Notes (Signed)
Medical screening examination/treatment/procedure(s) were performed by non-physician practitioner and as supervising physician I was immediately available for consultation/collaboration.   EKG Interpretation None        Maudry Diego, MD 02/04/14 1540

## 2014-05-26 ENCOUNTER — Encounter: Payer: Self-pay | Admitting: Adult Health

## 2014-05-26 ENCOUNTER — Ambulatory Visit (INDEPENDENT_AMBULATORY_CARE_PROVIDER_SITE_OTHER): Payer: Medicare HMO | Admitting: Adult Health

## 2014-05-26 VITALS — BP 106/62 | Ht 65.0 in | Wt 189.0 lb

## 2014-05-26 DIAGNOSIS — N904 Leukoplakia of vulva: Secondary | ICD-10-CM

## 2014-05-26 DIAGNOSIS — L900 Lichen sclerosus et atrophicus: Secondary | ICD-10-CM

## 2014-05-26 DIAGNOSIS — L80 Vitiligo: Secondary | ICD-10-CM

## 2014-05-26 DIAGNOSIS — L940 Localized scleroderma [morphea]: Secondary | ICD-10-CM

## 2014-05-26 MED ORDER — CLOBETASOL PROPIONATE 0.05 % EX CREA: TOPICAL_CREAM | CUTANEOUS | Status: DC

## 2014-05-26 NOTE — Patient Instructions (Signed)
Continue temovate Follow up in  6 months

## 2014-05-26 NOTE — Progress Notes (Signed)
Subjective:     Patient ID: Tiffany Hanson, female   DOB: 11/11/1945, 67 y.o.   MRN: [id number]  HPI Tiffany Hanson is a 68 year old black female in for a 6 month follow up for Lichen sclerosus.No complaints.  Review of Systems See HPI Reviewed past medical,surgical, social and family history. Reviewed medications and allergies.     Objective:   Physical Exam BP 106/62  Ht 5\' 5"  (1.651 m)  Wt 189 lb (85.73 kg)  BMI 31.45 kg/m2   Still has vitiligo and lichen sclerosus with leukoplakia, areas are looking better and is stable, had biopsy 3536 that showed lichen sclerosus, Dr Elonda Husky for co exam. Discussed this is chronic to keep using temovate cream.  Assessment:     Lichen sclerosus Vitiligo Leukoplakia     Plan:     Refilled temovate 0.05% cream 60 gm tube use 2-3 x weekly with 4 refills Return in  6 months for gyn physical

## 2014-07-03 ENCOUNTER — Ambulatory Visit: Payer: Medicare Other | Admitting: Oncology

## 2014-07-03 ENCOUNTER — Other Ambulatory Visit: Payer: Medicare Other | Admitting: Lab

## 2014-08-31 ENCOUNTER — Encounter: Payer: Self-pay | Admitting: Adult Health

## 2014-09-03 ENCOUNTER — Other Ambulatory Visit: Payer: Self-pay | Admitting: Obstetrics and Gynecology

## 2014-09-03 DIAGNOSIS — Z1231 Encounter for screening mammogram for malignant neoplasm of breast: Secondary | ICD-10-CM

## 2014-10-06 ENCOUNTER — Ambulatory Visit (HOSPITAL_COMMUNITY)
Admission: RE | Admit: 2014-10-06 | Discharge: 2014-10-06 | Disposition: A | Discharge status: Home / Self Care | Payer: Commercial Managed Care - HMO | Source: Ambulatory Visit | Attending: Obstetrics and Gynecology | Admitting: Obstetrics and Gynecology

## 2014-10-06 DIAGNOSIS — Z1231 Encounter for screening mammogram for malignant neoplasm of breast: Secondary | ICD-10-CM | POA: Insufficient documentation

## 2014-10-28 ENCOUNTER — Ambulatory Visit: Payer: Medicare HMO | Admitting: Adult Health

## 2014-11-03 ENCOUNTER — Encounter: Payer: Self-pay | Admitting: Adult Health

## 2014-11-03 ENCOUNTER — Ambulatory Visit (INDEPENDENT_AMBULATORY_CARE_PROVIDER_SITE_OTHER): Payer: Commercial Managed Care - HMO | Admitting: Adult Health

## 2014-11-03 VITALS — BP 118/70 | Ht 65.0 in | Wt 189.5 lb

## 2014-11-03 DIAGNOSIS — N904 Leukoplakia of vulva: Secondary | ICD-10-CM

## 2014-11-03 DIAGNOSIS — L900 Lichen sclerosus et atrophicus: Secondary | ICD-10-CM | POA: Diagnosis not present

## 2014-11-03 DIAGNOSIS — L80 Vitiligo: Secondary | ICD-10-CM | POA: Diagnosis not present

## 2014-11-03 MED ORDER — CLOBETASOL PROPIONATE 0.05 % EX CREA: TOPICAL_CREAM | CUTANEOUS | Status: DC

## 2014-11-03 NOTE — Progress Notes (Signed)
Subjective:     Patient ID: Tiffany Hanson, female   DOB: [birth year], 69 y.o.   MRN: [id number]  HPI Tiffany Hanson is a 69 year old black female in for recheck for stability of known lichen sclerosis,had biopsy.No complaints today, will see PCP next week for physical.  Review of Systems See HPI Reviewed past medical,surgical, social and family history. Reviewed medications and allergies.     Objective:   Physical Exam BP 118/70 mmHg  Ht 5\' 5"  (1.651 m)  Wt 189 lb 8 oz (85.957 kg)  BMI 31.53 kg/m2   Has vitiligo and areas of leukoplakia and thickness consistent with lichen sclerosus on bilateral vulva,has no itching or discomfort,No swelling of vulva or open lesions.  Assessment:     Lichen sclerosus Vitiligo Leukoplakia of vulva    Plan:     Continue temovate 2-3 x weekly Refilled temovate x 4 Follow up in 6 months for stability Pap 2017

## 2014-11-03 NOTE — Patient Instructions (Signed)
Pap 2017 Follow up in 6 months for recheck Continue temovate 2-3 x weekly

## 2014-11-10 DIAGNOSIS — E119 Type 2 diabetes mellitus without complications: Secondary | ICD-10-CM | POA: Diagnosis not present

## 2014-11-10 DIAGNOSIS — Z0000 Encounter for general adult medical examination without abnormal findings: Secondary | ICD-10-CM | POA: Diagnosis not present

## 2014-11-10 DIAGNOSIS — E1165 Type 2 diabetes mellitus with hyperglycemia: Secondary | ICD-10-CM | POA: Diagnosis not present

## 2014-11-10 DIAGNOSIS — Z6836 Body mass index (BMI) 36.0-36.9, adult: Secondary | ICD-10-CM | POA: Diagnosis not present

## 2014-11-10 DIAGNOSIS — E6609 Other obesity due to excess calories: Secondary | ICD-10-CM | POA: Diagnosis not present

## 2015-04-27 ENCOUNTER — Ambulatory Visit (INDEPENDENT_AMBULATORY_CARE_PROVIDER_SITE_OTHER): Payer: Commercial Managed Care - HMO | Admitting: Adult Health

## 2015-04-27 ENCOUNTER — Encounter: Payer: Self-pay | Admitting: Adult Health

## 2015-04-27 VITALS — BP 126/80 | HR 60 | Ht 65.0 in | Wt 184.5 lb

## 2015-04-27 DIAGNOSIS — B369 Superficial mycosis, unspecified: Secondary | ICD-10-CM

## 2015-04-27 DIAGNOSIS — L900 Lichen sclerosus et atrophicus: Secondary | ICD-10-CM

## 2015-04-27 HISTORY — DX: Superficial mycosis, unspecified: B36.9

## 2015-04-27 MED ORDER — FLUCONAZOLE 150 MG PO TABS: ORAL_TABLET | ORAL | Status: DC

## 2015-04-27 MED ORDER — NYSTATIN 100000 UNIT/GM EX POWD: CUTANEOUS | Status: DC

## 2015-04-27 NOTE — Progress Notes (Signed)
Subjective:     Patient ID: Tiffany Hanson, female   DOB: 27-Jul-1946, 69 y.o.   MRN: [id number]  HPI Tiffany Hanson is a 69 year old black female in fo r6 month follow up of lichen sclerosus, has rash in groin with itching today.  Review of Systems  Patient denies any headaches, hearing loss, fatigue, blurred vision, shortness of breath, chest pain, abdominal pain, problems with bowel movements, urination, or intercourse. No joint pain or mood swings.See HPI for positives.  Reviewed past medical,surgical, social and family history. Reviewed medications and allergies.     Objective:   Physical Exam BP 126/80 mmHg  Pulse 60  Ht 5\' 5"  (1.651 m)  Wt 184 lb 8 oz (83.689 kg)  BMI 30.70 kg/m2   continues to have white areas on labia and clitoris area, with some thick spots, but is stable, has skin fungus in groin area R>L.  Assessment:     Lichen sclerosus-stable Skin fungus    Plan:     Continue temovate Rx diflucan 150 mg #2 take 1 now and 1 in 3 days with 1 refill Rx nystatin powder use 2-3 x daily prn with 1 refill  Return in 1 year for physical Review handout on skin fungus

## 2015-04-27 NOTE — Patient Instructions (Signed)
Take diflucan  Do not zocor with this use nystatin powder Continue temovate Physical in  1 year  Yeast Infection of the Skin Some yeast on the skin is normal, but sometimes it causes an infection. If you have a yeast infection, it shows up as white or light brown patches on brown skin. You can see it better in the summer on tan skin. It causes light-colored holes in your suntan. It can happen on any area of the body. This cannot be passed from person to person. HOME CARE  Scrub your skin daily with a dandruff shampoo. Your rash may take a couple weeks to get well.  Do not scratch or itch the rash. GET HELP RIGHT AWAY IF:   You get another infection from scratching. The skin may get warm, red, and may ooze fluid.  The infection does not seem to be getting better. MAKE SURE YOU:  Understand these instructions.  Will watch your condition.  Will get help right away if you are not doing well or get worse. Document Released: 09/28/2008 Document Revised: 01/08/2012 Document Reviewed: 09/28/2008 [facility] Patient Information 2015 [place], [place]. This information is not intended to replace advice given to you by your health care provider. Make sure you discuss any questions you have with your health care provider.

## 2015-07-15 DIAGNOSIS — I10 Essential (primary) hypertension: Secondary | ICD-10-CM | POA: Diagnosis not present

## 2015-07-15 DIAGNOSIS — E6609 Other obesity due to excess calories: Secondary | ICD-10-CM | POA: Diagnosis not present

## 2015-07-15 DIAGNOSIS — E1129 Type 2 diabetes mellitus with other diabetic kidney complication: Secondary | ICD-10-CM | POA: Diagnosis not present

## 2015-07-15 DIAGNOSIS — Z1389 Encounter for screening for other disorder: Secondary | ICD-10-CM | POA: Diagnosis not present

## 2015-07-15 DIAGNOSIS — Z6835 Body mass index (BMI) 35.0-35.9, adult: Secondary | ICD-10-CM | POA: Diagnosis not present

## 2015-10-06 ENCOUNTER — Other Ambulatory Visit: Payer: Self-pay | Admitting: Adult Health

## 2015-11-08 DIAGNOSIS — H524 Presbyopia: Secondary | ICD-10-CM | POA: Diagnosis not present

## 2015-11-08 DIAGNOSIS — E1129 Type 2 diabetes mellitus with other diabetic kidney complication: Secondary | ICD-10-CM | POA: Diagnosis not present

## 2015-11-08 DIAGNOSIS — H5210 Myopia, unspecified eye: Secondary | ICD-10-CM | POA: Diagnosis not present

## 2015-11-19 ENCOUNTER — Other Ambulatory Visit (HOSPITAL_COMMUNITY): Payer: Self-pay | Admitting: Physician Assistant

## 2015-11-19 DIAGNOSIS — Z9889 Other specified postprocedural states: Secondary | ICD-10-CM

## 2015-11-22 ENCOUNTER — Other Ambulatory Visit (HOSPITAL_COMMUNITY): Payer: Self-pay | Admitting: Physician Assistant

## 2015-11-22 DIAGNOSIS — Z1231 Encounter for screening mammogram for malignant neoplasm of breast: Secondary | ICD-10-CM

## 2015-11-23 ENCOUNTER — Encounter (HOSPITAL_COMMUNITY): Payer: Medicare HMO

## 2015-11-24 ENCOUNTER — Ambulatory Visit (HOSPITAL_COMMUNITY)
Admission: RE | Admit: 2015-11-24 | Discharge: 2015-11-24 | Disposition: A | Discharge status: Home / Self Care | Payer: Commercial Managed Care - HMO | Source: Ambulatory Visit | Attending: Physician Assistant | Admitting: Physician Assistant

## 2015-11-24 ENCOUNTER — Ambulatory Visit (HOSPITAL_COMMUNITY): Payer: Medicare HMO

## 2015-11-24 DIAGNOSIS — Z1231 Encounter for screening mammogram for malignant neoplasm of breast: Secondary | ICD-10-CM | POA: Insufficient documentation

## 2015-11-30 ENCOUNTER — Other Ambulatory Visit (HOSPITAL_COMMUNITY): Payer: Self-pay | Admitting: Physician Assistant

## 2015-11-30 DIAGNOSIS — R928 Other abnormal and inconclusive findings on diagnostic imaging of breast: Secondary | ICD-10-CM

## 2015-12-07 ENCOUNTER — Encounter (HOSPITAL_COMMUNITY): Payer: Commercial Managed Care - HMO

## 2015-12-07 ENCOUNTER — Other Ambulatory Visit (HOSPITAL_COMMUNITY): Payer: Self-pay | Admitting: Physician Assistant

## 2015-12-07 ENCOUNTER — Ambulatory Visit (HOSPITAL_COMMUNITY)
Admission: RE | Admit: 2015-12-07 | Discharge: 2015-12-07 | Disposition: A | Discharge status: Home / Self Care | Payer: Commercial Managed Care - HMO | Source: Ambulatory Visit | Attending: Physician Assistant | Admitting: Physician Assistant

## 2015-12-07 DIAGNOSIS — R921 Mammographic calcification found on diagnostic imaging of breast: Secondary | ICD-10-CM | POA: Insufficient documentation

## 2015-12-07 DIAGNOSIS — Z9889 Other specified postprocedural states: Secondary | ICD-10-CM

## 2015-12-20 ENCOUNTER — Other Ambulatory Visit: Payer: Self-pay | Admitting: Adult Health

## 2015-12-20 DIAGNOSIS — E782 Mixed hyperlipidemia: Secondary | ICD-10-CM | POA: Diagnosis not present

## 2015-12-20 DIAGNOSIS — E6609 Other obesity due to excess calories: Secondary | ICD-10-CM | POA: Diagnosis not present

## 2015-12-20 DIAGNOSIS — Z0000 Encounter for general adult medical examination without abnormal findings: Secondary | ICD-10-CM | POA: Diagnosis not present

## 2015-12-20 DIAGNOSIS — E1165 Type 2 diabetes mellitus with hyperglycemia: Secondary | ICD-10-CM | POA: Diagnosis not present

## 2015-12-20 DIAGNOSIS — Z1389 Encounter for screening for other disorder: Secondary | ICD-10-CM | POA: Diagnosis not present

## 2015-12-20 DIAGNOSIS — E119 Type 2 diabetes mellitus without complications: Secondary | ICD-10-CM | POA: Diagnosis not present

## 2015-12-20 DIAGNOSIS — E1129 Type 2 diabetes mellitus with other diabetic kidney complication: Secondary | ICD-10-CM | POA: Diagnosis not present

## 2015-12-20 DIAGNOSIS — Z0001 Encounter for general adult medical examination with abnormal findings: Secondary | ICD-10-CM | POA: Diagnosis not present

## 2015-12-20 DIAGNOSIS — Z6833 Body mass index (BMI) 33.0-33.9, adult: Secondary | ICD-10-CM | POA: Diagnosis not present

## 2015-12-21 DIAGNOSIS — Z6833 Body mass index (BMI) 33.0-33.9, adult: Secondary | ICD-10-CM | POA: Diagnosis not present

## 2015-12-21 DIAGNOSIS — Z1389 Encounter for screening for other disorder: Secondary | ICD-10-CM | POA: Diagnosis not present

## 2015-12-21 DIAGNOSIS — L858 Other specified epidermal thickening: Secondary | ICD-10-CM | POA: Diagnosis not present

## 2015-12-21 DIAGNOSIS — E6609 Other obesity due to excess calories: Secondary | ICD-10-CM | POA: Diagnosis not present

## 2016-03-13 DIAGNOSIS — L7211 Pilar cyst: Secondary | ICD-10-CM | POA: Diagnosis not present

## 2016-12-04 ENCOUNTER — Other Ambulatory Visit (HOSPITAL_COMMUNITY): Payer: Self-pay | Admitting: Family Medicine

## 2016-12-04 DIAGNOSIS — Z1231 Encounter for screening mammogram for malignant neoplasm of breast: Secondary | ICD-10-CM

## 2016-12-11 ENCOUNTER — Ambulatory Visit (HOSPITAL_COMMUNITY): Payer: Commercial Managed Care - HMO

## 2016-12-15 ENCOUNTER — Ambulatory Visit (HOSPITAL_COMMUNITY): Payer: Commercial Managed Care - HMO

## 2016-12-18 ENCOUNTER — Ambulatory Visit (HOSPITAL_COMMUNITY)
Admission: RE | Admit: 2016-12-18 | Discharge: 2016-12-18 | Disposition: A | Discharge status: Home / Self Care | Payer: Medicare HMO | Source: Ambulatory Visit | Attending: Family Medicine | Admitting: Family Medicine

## 2016-12-18 ENCOUNTER — Encounter (HOSPITAL_COMMUNITY): Payer: Self-pay

## 2016-12-18 DIAGNOSIS — Z1231 Encounter for screening mammogram for malignant neoplasm of breast: Secondary | ICD-10-CM | POA: Insufficient documentation

## 2016-12-18 HISTORY — DX: Personal history of antineoplastic chemotherapy: Z92.21

## 2016-12-18 HISTORY — DX: Personal history of irradiation: Z92.3

## 2016-12-20 DIAGNOSIS — Z6835 Body mass index (BMI) 35.0-35.9, adult: Secondary | ICD-10-CM | POA: Diagnosis not present

## 2016-12-20 DIAGNOSIS — E782 Mixed hyperlipidemia: Secondary | ICD-10-CM | POA: Diagnosis not present

## 2016-12-20 DIAGNOSIS — Z1389 Encounter for screening for other disorder: Secondary | ICD-10-CM | POA: Diagnosis not present

## 2016-12-20 DIAGNOSIS — E6609 Other obesity due to excess calories: Secondary | ICD-10-CM | POA: Diagnosis not present

## 2016-12-20 DIAGNOSIS — E119 Type 2 diabetes mellitus without complications: Secondary | ICD-10-CM | POA: Diagnosis not present

## 2016-12-20 DIAGNOSIS — E1169 Type 2 diabetes mellitus with other specified complication: Secondary | ICD-10-CM | POA: Diagnosis not present

## 2016-12-20 DIAGNOSIS — Z0001 Encounter for general adult medical examination with abnormal findings: Secondary | ICD-10-CM | POA: Diagnosis not present

## 2016-12-20 DIAGNOSIS — E669 Obesity, unspecified: Secondary | ICD-10-CM | POA: Diagnosis not present

## 2017-02-01 DIAGNOSIS — E6609 Other obesity due to excess calories: Secondary | ICD-10-CM | POA: Diagnosis not present

## 2017-02-01 DIAGNOSIS — Z6836 Body mass index (BMI) 36.0-36.9, adult: Secondary | ICD-10-CM | POA: Diagnosis not present

## 2017-02-01 DIAGNOSIS — J3089 Other allergic rhinitis: Secondary | ICD-10-CM | POA: Diagnosis not present

## 2017-02-14 DIAGNOSIS — M4723 Other spondylosis with radiculopathy, cervicothoracic region: Secondary | ICD-10-CM | POA: Diagnosis not present

## 2017-02-14 DIAGNOSIS — M9907 Segmental and somatic dysfunction of upper extremity: Secondary | ICD-10-CM | POA: Diagnosis not present

## 2017-02-14 DIAGNOSIS — M9901 Segmental and somatic dysfunction of cervical region: Secondary | ICD-10-CM | POA: Diagnosis not present

## 2017-02-14 DIAGNOSIS — M25511 Pain in right shoulder: Secondary | ICD-10-CM | POA: Diagnosis not present

## 2017-02-14 DIAGNOSIS — M9902 Segmental and somatic dysfunction of thoracic region: Secondary | ICD-10-CM | POA: Diagnosis not present

## 2017-02-14 DIAGNOSIS — M546 Pain in thoracic spine: Secondary | ICD-10-CM | POA: Diagnosis not present

## 2017-02-16 DIAGNOSIS — M9901 Segmental and somatic dysfunction of cervical region: Secondary | ICD-10-CM | POA: Diagnosis not present

## 2017-02-16 DIAGNOSIS — M546 Pain in thoracic spine: Secondary | ICD-10-CM | POA: Diagnosis not present

## 2017-02-16 DIAGNOSIS — M9902 Segmental and somatic dysfunction of thoracic region: Secondary | ICD-10-CM | POA: Diagnosis not present

## 2017-02-16 DIAGNOSIS — M9907 Segmental and somatic dysfunction of upper extremity: Secondary | ICD-10-CM | POA: Diagnosis not present

## 2017-02-16 DIAGNOSIS — M25511 Pain in right shoulder: Secondary | ICD-10-CM | POA: Diagnosis not present

## 2017-02-19 DIAGNOSIS — M9901 Segmental and somatic dysfunction of cervical region: Secondary | ICD-10-CM | POA: Diagnosis not present

## 2017-02-19 DIAGNOSIS — M25511 Pain in right shoulder: Secondary | ICD-10-CM | POA: Diagnosis not present

## 2017-02-19 DIAGNOSIS — M9907 Segmental and somatic dysfunction of upper extremity: Secondary | ICD-10-CM | POA: Diagnosis not present

## 2017-02-19 DIAGNOSIS — M9902 Segmental and somatic dysfunction of thoracic region: Secondary | ICD-10-CM | POA: Diagnosis not present

## 2017-02-19 DIAGNOSIS — M546 Pain in thoracic spine: Secondary | ICD-10-CM | POA: Diagnosis not present

## 2017-02-21 DIAGNOSIS — M25511 Pain in right shoulder: Secondary | ICD-10-CM | POA: Diagnosis not present

## 2017-02-21 DIAGNOSIS — M9901 Segmental and somatic dysfunction of cervical region: Secondary | ICD-10-CM | POA: Diagnosis not present

## 2017-02-21 DIAGNOSIS — M9902 Segmental and somatic dysfunction of thoracic region: Secondary | ICD-10-CM | POA: Diagnosis not present

## 2017-02-21 DIAGNOSIS — M546 Pain in thoracic spine: Secondary | ICD-10-CM | POA: Diagnosis not present

## 2017-02-21 DIAGNOSIS — M9907 Segmental and somatic dysfunction of upper extremity: Secondary | ICD-10-CM | POA: Diagnosis not present

## 2017-03-02 DIAGNOSIS — M546 Pain in thoracic spine: Secondary | ICD-10-CM | POA: Diagnosis not present

## 2017-03-02 DIAGNOSIS — M9902 Segmental and somatic dysfunction of thoracic region: Secondary | ICD-10-CM | POA: Diagnosis not present

## 2017-03-02 DIAGNOSIS — M9907 Segmental and somatic dysfunction of upper extremity: Secondary | ICD-10-CM | POA: Diagnosis not present

## 2017-03-02 DIAGNOSIS — M9901 Segmental and somatic dysfunction of cervical region: Secondary | ICD-10-CM | POA: Diagnosis not present

## 2017-03-02 DIAGNOSIS — M25511 Pain in right shoulder: Secondary | ICD-10-CM | POA: Diagnosis not present

## 2017-03-16 DIAGNOSIS — M9907 Segmental and somatic dysfunction of upper extremity: Secondary | ICD-10-CM | POA: Diagnosis not present

## 2017-03-16 DIAGNOSIS — M9902 Segmental and somatic dysfunction of thoracic region: Secondary | ICD-10-CM | POA: Diagnosis not present

## 2017-03-16 DIAGNOSIS — M546 Pain in thoracic spine: Secondary | ICD-10-CM | POA: Diagnosis not present

## 2017-03-16 DIAGNOSIS — M25511 Pain in right shoulder: Secondary | ICD-10-CM | POA: Diagnosis not present

## 2017-03-16 DIAGNOSIS — M9901 Segmental and somatic dysfunction of cervical region: Secondary | ICD-10-CM | POA: Diagnosis not present

## 2017-04-13 DIAGNOSIS — Z6835 Body mass index (BMI) 35.0-35.9, adult: Secondary | ICD-10-CM | POA: Diagnosis not present

## 2017-04-13 DIAGNOSIS — I10 Essential (primary) hypertension: Secondary | ICD-10-CM | POA: Diagnosis not present

## 2017-04-13 DIAGNOSIS — E6609 Other obesity due to excess calories: Secondary | ICD-10-CM | POA: Diagnosis not present

## 2017-04-13 DIAGNOSIS — Z1389 Encounter for screening for other disorder: Secondary | ICD-10-CM | POA: Diagnosis not present

## 2017-04-13 DIAGNOSIS — E119 Type 2 diabetes mellitus without complications: Secondary | ICD-10-CM | POA: Diagnosis not present

## 2017-04-13 DIAGNOSIS — E782 Mixed hyperlipidemia: Secondary | ICD-10-CM | POA: Diagnosis not present

## 2017-04-13 DIAGNOSIS — M79601 Pain in right arm: Secondary | ICD-10-CM | POA: Diagnosis not present

## 2017-04-13 DIAGNOSIS — E669 Obesity, unspecified: Secondary | ICD-10-CM | POA: Diagnosis not present

## 2017-04-13 DIAGNOSIS — M791 Myalgia: Secondary | ICD-10-CM | POA: Diagnosis not present

## 2017-08-06 DIAGNOSIS — E119 Type 2 diabetes mellitus without complications: Secondary | ICD-10-CM | POA: Diagnosis not present

## 2017-08-06 DIAGNOSIS — Z6836 Body mass index (BMI) 36.0-36.9, adult: Secondary | ICD-10-CM | POA: Diagnosis not present

## 2017-08-06 DIAGNOSIS — I10 Essential (primary) hypertension: Secondary | ICD-10-CM | POA: Diagnosis not present

## 2017-08-06 DIAGNOSIS — Z1389 Encounter for screening for other disorder: Secondary | ICD-10-CM | POA: Diagnosis not present

## 2017-08-06 DIAGNOSIS — M79602 Pain in left arm: Secondary | ICD-10-CM | POA: Diagnosis not present

## 2017-11-20 ENCOUNTER — Other Ambulatory Visit: Payer: Self-pay | Admitting: Obstetrics and Gynecology

## 2017-11-20 DIAGNOSIS — Z1231 Encounter for screening mammogram for malignant neoplasm of breast: Secondary | ICD-10-CM

## 2017-12-20 ENCOUNTER — Encounter (HOSPITAL_COMMUNITY): Payer: Self-pay

## 2017-12-20 ENCOUNTER — Ambulatory Visit (HOSPITAL_COMMUNITY)
Admission: RE | Admit: 2017-12-20 | Discharge: 2017-12-20 | Disposition: A | Discharge status: Home / Self Care | Payer: Medicare HMO | Source: Ambulatory Visit | Attending: Obstetrics and Gynecology | Admitting: Obstetrics and Gynecology

## 2017-12-20 DIAGNOSIS — Z1231 Encounter for screening mammogram for malignant neoplasm of breast: Secondary | ICD-10-CM | POA: Diagnosis not present

## 2018-01-10 DIAGNOSIS — E6609 Other obesity due to excess calories: Secondary | ICD-10-CM | POA: Diagnosis not present

## 2018-01-10 DIAGNOSIS — E782 Mixed hyperlipidemia: Secondary | ICD-10-CM | POA: Diagnosis not present

## 2018-01-10 DIAGNOSIS — E119 Type 2 diabetes mellitus without complications: Secondary | ICD-10-CM | POA: Diagnosis not present

## 2018-01-10 DIAGNOSIS — Z0001 Encounter for general adult medical examination with abnormal findings: Secondary | ICD-10-CM | POA: Diagnosis not present

## 2018-01-10 DIAGNOSIS — E1129 Type 2 diabetes mellitus with other diabetic kidney complication: Secondary | ICD-10-CM | POA: Diagnosis not present

## 2018-01-10 DIAGNOSIS — Z6836 Body mass index (BMI) 36.0-36.9, adult: Secondary | ICD-10-CM | POA: Diagnosis not present

## 2018-01-10 DIAGNOSIS — Z1389 Encounter for screening for other disorder: Secondary | ICD-10-CM | POA: Diagnosis not present

## 2018-01-22 DIAGNOSIS — E782 Mixed hyperlipidemia: Secondary | ICD-10-CM | POA: Diagnosis not present

## 2018-01-22 DIAGNOSIS — E1129 Type 2 diabetes mellitus with other diabetic kidney complication: Secondary | ICD-10-CM | POA: Diagnosis not present

## 2018-02-21 DIAGNOSIS — R6889 Other general symptoms and signs: Secondary | ICD-10-CM | POA: Diagnosis not present

## 2018-02-21 DIAGNOSIS — B349 Viral infection, unspecified: Secondary | ICD-10-CM | POA: Diagnosis not present

## 2018-02-21 DIAGNOSIS — E6609 Other obesity due to excess calories: Secondary | ICD-10-CM | POA: Diagnosis not present

## 2018-02-21 DIAGNOSIS — J019 Acute sinusitis, unspecified: Secondary | ICD-10-CM | POA: Diagnosis not present

## 2018-02-21 DIAGNOSIS — Z6836 Body mass index (BMI) 36.0-36.9, adult: Secondary | ICD-10-CM | POA: Diagnosis not present

## 2018-05-08 DIAGNOSIS — E119 Type 2 diabetes mellitus without complications: Secondary | ICD-10-CM | POA: Diagnosis not present

## 2018-07-17 DIAGNOSIS — Z6835 Body mass index (BMI) 35.0-35.9, adult: Secondary | ICD-10-CM | POA: Diagnosis not present

## 2018-07-17 DIAGNOSIS — E6609 Other obesity due to excess calories: Secondary | ICD-10-CM | POA: Diagnosis not present

## 2018-07-17 DIAGNOSIS — Z1389 Encounter for screening for other disorder: Secondary | ICD-10-CM | POA: Diagnosis not present

## 2018-07-17 DIAGNOSIS — E119 Type 2 diabetes mellitus without complications: Secondary | ICD-10-CM | POA: Diagnosis not present

## 2018-07-17 DIAGNOSIS — L209 Atopic dermatitis, unspecified: Secondary | ICD-10-CM | POA: Diagnosis not present

## 2018-07-19 DIAGNOSIS — E6609 Other obesity due to excess calories: Secondary | ICD-10-CM | POA: Diagnosis not present

## 2018-07-19 DIAGNOSIS — Z1389 Encounter for screening for other disorder: Secondary | ICD-10-CM | POA: Diagnosis not present

## 2018-07-19 DIAGNOSIS — E119 Type 2 diabetes mellitus without complications: Secondary | ICD-10-CM | POA: Diagnosis not present

## 2018-07-19 DIAGNOSIS — Z6835 Body mass index (BMI) 35.0-35.9, adult: Secondary | ICD-10-CM | POA: Diagnosis not present

## 2018-11-20 ENCOUNTER — Other Ambulatory Visit (HOSPITAL_COMMUNITY): Payer: Self-pay | Admitting: Obstetrics and Gynecology

## 2018-11-20 DIAGNOSIS — Z1231 Encounter for screening mammogram for malignant neoplasm of breast: Secondary | ICD-10-CM

## 2018-12-23 ENCOUNTER — Ambulatory Visit (HOSPITAL_COMMUNITY)
Admission: RE | Admit: 2018-12-23 | Discharge: 2018-12-23 | Disposition: A | Discharge status: Home / Self Care | Payer: Medicare HMO | Source: Ambulatory Visit | Attending: Obstetrics and Gynecology | Admitting: Obstetrics and Gynecology

## 2018-12-23 DIAGNOSIS — Z1231 Encounter for screening mammogram for malignant neoplasm of breast: Secondary | ICD-10-CM

## 2018-12-24 ENCOUNTER — Ambulatory Visit (HOSPITAL_COMMUNITY)
Admission: RE | Admit: 2018-12-24 | Discharge: 2018-12-24 | Disposition: A | Discharge status: Home / Self Care | Payer: Medicare HMO | Source: Ambulatory Visit | Attending: Physician Assistant | Admitting: Physician Assistant

## 2018-12-24 ENCOUNTER — Other Ambulatory Visit (HOSPITAL_COMMUNITY): Payer: Self-pay | Admitting: Physician Assistant

## 2018-12-24 DIAGNOSIS — Z1389 Encounter for screening for other disorder: Secondary | ICD-10-CM | POA: Diagnosis not present

## 2018-12-24 DIAGNOSIS — Z681 Body mass index (BMI) 19 or less, adult: Secondary | ICD-10-CM | POA: Diagnosis not present

## 2018-12-24 DIAGNOSIS — R0989 Other specified symptoms and signs involving the circulatory and respiratory systems: Secondary | ICD-10-CM | POA: Diagnosis not present

## 2018-12-24 DIAGNOSIS — R05 Cough: Secondary | ICD-10-CM | POA: Diagnosis not present

## 2018-12-24 DIAGNOSIS — Z0001 Encounter for general adult medical examination with abnormal findings: Secondary | ICD-10-CM | POA: Diagnosis not present

## 2018-12-24 DIAGNOSIS — E119 Type 2 diabetes mellitus without complications: Secondary | ICD-10-CM | POA: Diagnosis not present

## 2018-12-24 DIAGNOSIS — E1165 Type 2 diabetes mellitus with hyperglycemia: Secondary | ICD-10-CM | POA: Diagnosis not present

## 2019-01-08 ENCOUNTER — Encounter: Payer: Self-pay | Admitting: Internal Medicine

## 2019-01-27 ENCOUNTER — Ambulatory Visit: Payer: Medicare HMO

## 2019-07-14 DIAGNOSIS — E1165 Type 2 diabetes mellitus with hyperglycemia: Secondary | ICD-10-CM | POA: Diagnosis not present

## 2019-07-14 DIAGNOSIS — I10 Essential (primary) hypertension: Secondary | ICD-10-CM | POA: Diagnosis not present

## 2019-07-14 DIAGNOSIS — E119 Type 2 diabetes mellitus without complications: Secondary | ICD-10-CM | POA: Diagnosis not present

## 2019-07-14 DIAGNOSIS — Z6835 Body mass index (BMI) 35.0-35.9, adult: Secondary | ICD-10-CM | POA: Diagnosis not present

## 2019-07-14 DIAGNOSIS — E6609 Other obesity due to excess calories: Secondary | ICD-10-CM | POA: Diagnosis not present

## 2019-07-15 DIAGNOSIS — Z6835 Body mass index (BMI) 35.0-35.9, adult: Secondary | ICD-10-CM | POA: Diagnosis not present

## 2019-07-15 DIAGNOSIS — E6609 Other obesity due to excess calories: Secondary | ICD-10-CM | POA: Diagnosis not present

## 2019-07-15 DIAGNOSIS — E1165 Type 2 diabetes mellitus with hyperglycemia: Secondary | ICD-10-CM | POA: Diagnosis not present

## 2019-07-29 DIAGNOSIS — Z1389 Encounter for screening for other disorder: Secondary | ICD-10-CM | POA: Diagnosis not present

## 2019-07-29 DIAGNOSIS — B029 Zoster without complications: Secondary | ICD-10-CM | POA: Diagnosis not present

## 2019-07-29 DIAGNOSIS — Z6836 Body mass index (BMI) 36.0-36.9, adult: Secondary | ICD-10-CM | POA: Diagnosis not present

## 2019-07-29 DIAGNOSIS — E6609 Other obesity due to excess calories: Secondary | ICD-10-CM | POA: Diagnosis not present

## 2019-10-30 DIAGNOSIS — I129 Hypertensive chronic kidney disease with stage 1 through stage 4 chronic kidney disease, or unspecified chronic kidney disease: Secondary | ICD-10-CM | POA: Diagnosis not present

## 2019-10-30 DIAGNOSIS — E1165 Type 2 diabetes mellitus with hyperglycemia: Secondary | ICD-10-CM | POA: Diagnosis not present

## 2019-10-30 DIAGNOSIS — E1122 Type 2 diabetes mellitus with diabetic chronic kidney disease: Secondary | ICD-10-CM | POA: Diagnosis not present

## 2019-10-30 DIAGNOSIS — Z7984 Long term (current) use of oral hypoglycemic drugs: Secondary | ICD-10-CM | POA: Diagnosis not present

## 2019-11-25 ENCOUNTER — Other Ambulatory Visit (HOSPITAL_COMMUNITY): Payer: Self-pay | Admitting: Physician Assistant

## 2019-11-25 DIAGNOSIS — Z1231 Encounter for screening mammogram for malignant neoplasm of breast: Secondary | ICD-10-CM

## 2019-12-17 DIAGNOSIS — Z853 Personal history of malignant neoplasm of breast: Secondary | ICD-10-CM | POA: Diagnosis not present

## 2019-12-17 DIAGNOSIS — Z0001 Encounter for general adult medical examination with abnormal findings: Secondary | ICD-10-CM | POA: Diagnosis not present

## 2019-12-17 DIAGNOSIS — N183 Chronic kidney disease, stage 3 unspecified: Secondary | ICD-10-CM | POA: Diagnosis not present

## 2019-12-17 DIAGNOSIS — Z6835 Body mass index (BMI) 35.0-35.9, adult: Secondary | ICD-10-CM | POA: Diagnosis not present

## 2019-12-17 DIAGNOSIS — Z0000 Encounter for general adult medical examination without abnormal findings: Secondary | ICD-10-CM | POA: Diagnosis not present

## 2019-12-17 DIAGNOSIS — E1129 Type 2 diabetes mellitus with other diabetic kidney complication: Secondary | ICD-10-CM | POA: Diagnosis not present

## 2019-12-17 DIAGNOSIS — E6609 Other obesity due to excess calories: Secondary | ICD-10-CM | POA: Diagnosis not present

## 2019-12-17 DIAGNOSIS — I10 Essential (primary) hypertension: Secondary | ICD-10-CM | POA: Diagnosis not present

## 2019-12-17 DIAGNOSIS — K219 Gastro-esophageal reflux disease without esophagitis: Secondary | ICD-10-CM | POA: Diagnosis not present

## 2019-12-17 DIAGNOSIS — Z1389 Encounter for screening for other disorder: Secondary | ICD-10-CM | POA: Diagnosis not present

## 2019-12-17 DIAGNOSIS — E1165 Type 2 diabetes mellitus with hyperglycemia: Secondary | ICD-10-CM | POA: Diagnosis not present

## 2019-12-17 DIAGNOSIS — E782 Mixed hyperlipidemia: Secondary | ICD-10-CM | POA: Diagnosis not present

## 2019-12-18 ENCOUNTER — Ambulatory Visit: Payer: Medicare HMO

## 2019-12-22 ENCOUNTER — Ambulatory Visit: Payer: Medicare HMO | Attending: Family

## 2019-12-22 DIAGNOSIS — Z23 Encounter for immunization: Secondary | ICD-10-CM | POA: Insufficient documentation

## 2019-12-22 NOTE — Progress Notes (Signed)
   Covid-19 Vaccination Clinic  Name:  Tiffany Hanson    MRN: [id number] DOB: 08/31/1946  12/22/2019  [name] was observed post Covid-19 immunization for 15 minutes without incidence. She was provided with Vaccine Information Sheet and instruction to access the V-Safe system.   [name] was instructed to call 911 with any severe reactions post vaccine: Marland Kitchen Difficulty breathing  . Swelling of your face and throat  . A fast heartbeat  . A bad rash all over your body  . Dizziness and weakness    Immunizations Administered    Name Date Dose VIS Date Route   Moderna COVID-19 Vaccine 12/22/2019 12:26 PM 0.5 mL 09/30/2019 Intramuscular   Manufacturer: Moderna   Lot: [id number]   [id number]

## 2019-12-31 ENCOUNTER — Ambulatory Visit (HOSPITAL_COMMUNITY)
Admission: RE | Admit: 2019-12-31 | Discharge: 2019-12-31 | Disposition: A | Discharge status: Home / Self Care | Payer: Medicare HMO | Source: Ambulatory Visit | Attending: Physician Assistant | Admitting: Physician Assistant

## 2019-12-31 ENCOUNTER — Other Ambulatory Visit: Payer: Self-pay

## 2019-12-31 DIAGNOSIS — Z1231 Encounter for screening mammogram for malignant neoplasm of breast: Secondary | ICD-10-CM

## 2020-01-20 ENCOUNTER — Ambulatory Visit: Payer: Medicare HMO | Attending: Family

## 2020-01-20 DIAGNOSIS — Z23 Encounter for immunization: Secondary | ICD-10-CM

## 2020-01-20 NOTE — Progress Notes (Signed)
   Covid-19 Vaccination Clinic  Name:  Tiffany Hanson    MRN: [id number] DOB: 01-03-1946  01/20/2020  Tiffany Hanson was observed post Covid-19 immunization for 15 minutes without incident. She was provided with Vaccine Information Sheet and instruction to access the V-Safe system.   Tiffany Hanson was instructed to call 911 with any severe reactions post vaccine: Marland Kitchen Difficulty breathing  . Swelling of face and throat  . A fast heartbeat  . A bad rash all over body  . Dizziness and weakness   Immunizations Administered    Name Date Dose VIS Date Route   Moderna COVID-19 Vaccine 01/20/2020  3:42 PM 0.5 mL 09/30/2019 Intramuscular   Manufacturer: Moderna   LotMV:[id number]   [id number]

## 2020-01-27 ENCOUNTER — Ambulatory Visit: Payer: Medicare HMO

## 2020-01-28 DIAGNOSIS — E1165 Type 2 diabetes mellitus with hyperglycemia: Secondary | ICD-10-CM | POA: Diagnosis not present

## 2020-01-28 DIAGNOSIS — I129 Hypertensive chronic kidney disease with stage 1 through stage 4 chronic kidney disease, or unspecified chronic kidney disease: Secondary | ICD-10-CM | POA: Diagnosis not present

## 2020-01-28 DIAGNOSIS — E1122 Type 2 diabetes mellitus with diabetic chronic kidney disease: Secondary | ICD-10-CM | POA: Diagnosis not present

## 2020-01-28 DIAGNOSIS — Z7984 Long term (current) use of oral hypoglycemic drugs: Secondary | ICD-10-CM | POA: Diagnosis not present

## 2020-02-16 ENCOUNTER — Telehealth: Payer: Self-pay | Admitting: Adult Health

## 2020-02-16 NOTE — Telephone Encounter (Signed)

## 2020-02-17 ENCOUNTER — Encounter: Payer: Self-pay | Admitting: Adult Health

## 2020-02-17 ENCOUNTER — Ambulatory Visit (INDEPENDENT_AMBULATORY_CARE_PROVIDER_SITE_OTHER): Payer: Medicare HMO | Admitting: Adult Health

## 2020-02-17 ENCOUNTER — Other Ambulatory Visit: Payer: Self-pay

## 2020-02-17 ENCOUNTER — Other Ambulatory Visit (HOSPITAL_COMMUNITY)
Admission: RE | Admit: 2020-02-17 | Discharge: 2020-02-17 | Disposition: A | Discharge status: Home / Self Care | Payer: Medicare HMO | Source: Ambulatory Visit | Attending: Adult Health | Admitting: Adult Health

## 2020-02-17 VITALS — BP 133/86 | HR 78 | Ht 65.0 in | Wt 186.5 lb

## 2020-02-17 DIAGNOSIS — N898 Other specified noninflammatory disorders of vagina: Secondary | ICD-10-CM | POA: Insufficient documentation

## 2020-02-17 DIAGNOSIS — B379 Candidiasis, unspecified: Secondary | ICD-10-CM | POA: Insufficient documentation

## 2020-02-17 DIAGNOSIS — B373 Candidiasis of vulva and vagina: Secondary | ICD-10-CM | POA: Diagnosis not present

## 2020-02-17 DIAGNOSIS — L900 Lichen sclerosus et atrophicus: Secondary | ICD-10-CM | POA: Diagnosis not present

## 2020-02-17 DIAGNOSIS — L80 Vitiligo: Secondary | ICD-10-CM | POA: Diagnosis not present

## 2020-02-17 LAB — POCT WET PREP (WET MOUNT): WBC, Wet Prep HPF POC: POSITIVE

## 2020-02-17 MED ORDER — FLUCONAZOLE 100 MG PO TABS: ORAL_TABLET | ORAL | 0 refills | Status: DC

## 2020-02-17 NOTE — Progress Notes (Signed)
Patient ID: Tiffany Hanson, female   DOB: Mar 22, 1946, 74 y.o.   MRN: [id number] History of Present Illness: Tiffany Hanson is a 74 year old black female,married, PM in complaining of vaginal discharge and itching for 2 months esp at night.  PCP is Delman Cheadle PA.  Current Medications, Allergies, Past Medical History, Past Surgical History, Family History and Social History were reviewed in [employer] record.     Review of Systems: Vaginal discharge and itching for 2 months, esp at night.  Has diabetes and sugars are up Has not tried any OTC meds Nor currently sexually active    Physical Exam:BP 133/86 (BP Location: Left Arm, Patient Position: Sitting, Cuff Size: Normal)   Pulse 78   Ht 5\' 5"  (1.651 m)   Wt 186 lb 8 oz (84.6 kg)   BMI 31.04 kg/m  General:  Well developed, well nourished, no acute distress Skin:  Warm and dry Lungs; Clear to auscultation bilaterally Cardiovascular: Regular rate and rhythm Pelvic:  External genitalia has vitiligo and near clitoris there is thickened skin that is white.  The vagina has red side walls and clumpy white vaginal discharge . Urethra has no lesions or masses. The cervix is smooth.  Uterus is felt to be normal size, shape, and contour.  No adnexal masses or tenderness noted.Bladder is non tender, no masses felt. Wet prep:+WBCs and yeast, CV swab obtained. Painted inner vulva and vagina with gentian violet.  Extremities/musculoskeletal:  No swelling or varicosities noted, no clubbing or cyanosis Psych:  No mood changes, alert and cooperative,seems happy AA 2 Fall risk is low PHQ 9 score is 2 Examination chaperoned by Levy Pupa LPN  Impression and Plan:  1. Vaginal discharge CV swab sent for trich, BV and yeast  2. Vaginal itching Painted with gentian violet  3. Yeast infection Painted with gentian violet Will rx diflucan 100 mg daily for 10 days, do not take zocor these days  Meds ordered this encounter   Medications  . fluconazole (DIFLUCAN) 100 MG tablet    Sig: Take 1 daily for 10 days    Dispense:  10 tablet    Refill:  0    Order Specific Question:   Supervising Provider    Answer:   Florian Buff [[id number]]  will recheck in 10 days   4. Lichen sclerosus Will add temovate at 10 day follow up   5. Vitiligo

## 2020-02-19 ENCOUNTER — Telehealth: Payer: Self-pay | Admitting: *Deleted

## 2020-02-19 LAB — CERVICOVAGINAL ANCILLARY ONLY
Bacterial Vaginitis (gardnerella): NEGATIVE
Candida Glabrata: NEGATIVE
Candida Vaginitis: POSITIVE — AB
Comment: NEGATIVE
Comment: NEGATIVE
Comment: NEGATIVE
Comment: NEGATIVE
Trichomonas: NEGATIVE

## 2020-02-19 NOTE — Telephone Encounter (Signed)
-----   Message from Estill Dooms, NP sent at 02/19/2020  1:36 PM EDT ----- Let Judeth Porch know CV swab showed just yeast and we treated her for that, see her 4/30 in follow up

## 2020-02-19 NOTE — Telephone Encounter (Signed)
Pt aware swab showed yeast and she was treated for that. Will see her at follow up on 4/30. Pt voiced understanding. [place]

## 2020-02-26 ENCOUNTER — Telehealth: Payer: Self-pay | Admitting: Adult Health

## 2020-02-26 NOTE — Telephone Encounter (Signed)

## 2020-02-27 ENCOUNTER — Ambulatory Visit (INDEPENDENT_AMBULATORY_CARE_PROVIDER_SITE_OTHER): Payer: Medicare HMO | Admitting: Adult Health

## 2020-02-27 ENCOUNTER — Other Ambulatory Visit: Payer: Self-pay

## 2020-02-27 ENCOUNTER — Encounter: Payer: Self-pay | Admitting: Adult Health

## 2020-02-27 VITALS — BP 122/82 | HR 79 | Ht 65.0 in | Wt 184.0 lb

## 2020-02-27 DIAGNOSIS — Z7984 Long term (current) use of oral hypoglycemic drugs: Secondary | ICD-10-CM | POA: Diagnosis not present

## 2020-02-27 DIAGNOSIS — E1165 Type 2 diabetes mellitus with hyperglycemia: Secondary | ICD-10-CM | POA: Diagnosis not present

## 2020-02-27 DIAGNOSIS — I129 Hypertensive chronic kidney disease with stage 1 through stage 4 chronic kidney disease, or unspecified chronic kidney disease: Secondary | ICD-10-CM | POA: Diagnosis not present

## 2020-02-27 DIAGNOSIS — E1122 Type 2 diabetes mellitus with diabetic chronic kidney disease: Secondary | ICD-10-CM | POA: Diagnosis not present

## 2020-02-27 DIAGNOSIS — L900 Lichen sclerosus et atrophicus: Secondary | ICD-10-CM | POA: Diagnosis not present

## 2020-02-27 DIAGNOSIS — L80 Vitiligo: Secondary | ICD-10-CM

## 2020-02-27 MED ORDER — CLOBETASOL PROPIONATE 0.05 % EX OINT: TOPICAL_OINTMENT | CUTANEOUS | 3 refills | Status: DC

## 2020-02-27 NOTE — Progress Notes (Signed)
  Subjective:     Patient ID: Tiffany Hanson, female   DOB: [birth year], 74 y.o.   MRN: [id number]  HPI Thai is a 74 year old black female,married, PM in for recheck on vaginal discharge and itching and she is 95 % better she says. PCP is [place].  Review of Systems Itching is so much better, at least 95% better    Reviewed past medical,surgical, social and family history. Reviewed medications and allergies.  Objective:   Physical Exam BP 122/82 (BP Location: Left Arm, Patient Position: Sitting, Cuff Size: Normal)   Pulse 79   Ht 5\' 5"  (1.651 m)   Wt 184 lb (83.5 kg)   BMI 30.62 kg/m skin warm and dry, external genitalia has vitiligo, and there is still thick white skin near clitoris but the redness has resolved in vagina  no discharge.  Examination chaperoned by Celene Squibb LPN  Assessment:     1. Lichen sclerosus Will Rx temovate Meds ordered this encounter  Medications  . clobetasol ointment (TEMOVATE) 0.05 %    Sig: Use bid for 2 weeks then 2-3 x wekly    Dispense:  45 g    Refill:  3    Order Specific Question:   Supervising Provider    Answer:   Elonda Husky, LUTHER H [[id number]]    2. Vitiligo     Plan:     Follow up in 8 weeks

## 2020-03-30 DIAGNOSIS — E118 Type 2 diabetes mellitus with unspecified complications: Secondary | ICD-10-CM | POA: Diagnosis not present

## 2020-04-23 ENCOUNTER — Telehealth: Payer: Self-pay | Admitting: Adult Health

## 2020-04-23 NOTE — Telephone Encounter (Signed)

## 2020-04-26 ENCOUNTER — Ambulatory Visit: Payer: Medicare HMO | Admitting: Adult Health

## 2020-04-28 ENCOUNTER — Telehealth: Payer: Self-pay | Admitting: Adult Health

## 2020-04-28 DIAGNOSIS — E1165 Type 2 diabetes mellitus with hyperglycemia: Secondary | ICD-10-CM | POA: Diagnosis not present

## 2020-04-28 DIAGNOSIS — Z7984 Long term (current) use of oral hypoglycemic drugs: Secondary | ICD-10-CM | POA: Diagnosis not present

## 2020-04-28 DIAGNOSIS — E1122 Type 2 diabetes mellitus with diabetic chronic kidney disease: Secondary | ICD-10-CM | POA: Diagnosis not present

## 2020-04-28 DIAGNOSIS — I129 Hypertensive chronic kidney disease with stage 1 through stage 4 chronic kidney disease, or unspecified chronic kidney disease: Secondary | ICD-10-CM | POA: Diagnosis not present

## 2020-04-28 NOTE — Telephone Encounter (Signed)

## 2020-04-29 ENCOUNTER — Ambulatory Visit (INDEPENDENT_AMBULATORY_CARE_PROVIDER_SITE_OTHER): Payer: Medicare HMO | Admitting: Adult Health

## 2020-04-29 ENCOUNTER — Encounter: Payer: Self-pay | Admitting: Adult Health

## 2020-04-29 VITALS — BP 132/77 | HR 66 | Ht 65.0 in | Wt 186.0 lb

## 2020-04-29 DIAGNOSIS — L80 Vitiligo: Secondary | ICD-10-CM | POA: Diagnosis not present

## 2020-04-29 DIAGNOSIS — L900 Lichen sclerosus et atrophicus: Secondary | ICD-10-CM

## 2020-04-29 DIAGNOSIS — E118 Type 2 diabetes mellitus with unspecified complications: Secondary | ICD-10-CM | POA: Diagnosis not present

## 2020-04-29 NOTE — Progress Notes (Signed)
  Subjective:     Patient ID: Tiffany Hanson, female   DOB: 1946/06/28, 74 y.o.   MRN: [id number]  HPI Tiffany Hanson is a 74 year old black female, married, PM back in follow up on using temovate for itching with lichen sclerosus, and is much better. PCP is Delman Cheadle PA.  Review of Systems Feels much better, may itch once in a while Reviewed past medical,surgical, social and family history. Reviewed medications and allergies.     Objective:   Physical Exam    BP 132/77 (BP Location: Left Arm, Patient Position: Sitting, Cuff Size: Normal)   Pulse 66   Ht 5\' 5"  (1.651 m)   Wt 186 lb (84.4 kg)   BMI 30.95 kg/m   Skin is warm and dry, has vitiligo on external genitalia, skin near clitoris is still white but not as thick Examination chaperoned by Levy Pupa LPN  Assessment:     1. Lichen sclerosus Continue temovate 2-3 x weekly, has refills   2. Vitiligo    Plan:     Follow up prn

## 2020-05-28 DIAGNOSIS — Z7984 Long term (current) use of oral hypoglycemic drugs: Secondary | ICD-10-CM | POA: Diagnosis not present

## 2020-05-28 DIAGNOSIS — I129 Hypertensive chronic kidney disease with stage 1 through stage 4 chronic kidney disease, or unspecified chronic kidney disease: Secondary | ICD-10-CM | POA: Diagnosis not present

## 2020-05-28 DIAGNOSIS — E1165 Type 2 diabetes mellitus with hyperglycemia: Secondary | ICD-10-CM | POA: Diagnosis not present

## 2020-05-28 DIAGNOSIS — E1122 Type 2 diabetes mellitus with diabetic chronic kidney disease: Secondary | ICD-10-CM | POA: Diagnosis not present

## 2020-05-30 DIAGNOSIS — E118 Type 2 diabetes mellitus with unspecified complications: Secondary | ICD-10-CM | POA: Diagnosis not present

## 2020-06-30 DIAGNOSIS — E118 Type 2 diabetes mellitus with unspecified complications: Secondary | ICD-10-CM | POA: Diagnosis not present

## 2020-07-07 ENCOUNTER — Ambulatory Visit (INDEPENDENT_AMBULATORY_CARE_PROVIDER_SITE_OTHER): Payer: Medicare HMO | Admitting: Dermatology

## 2020-07-07 ENCOUNTER — Other Ambulatory Visit: Payer: Self-pay

## 2020-07-07 DIAGNOSIS — L820 Inflamed seborrheic keratosis: Secondary | ICD-10-CM

## 2020-07-07 DIAGNOSIS — D1801 Hemangioma of skin and subcutaneous tissue: Secondary | ICD-10-CM

## 2020-07-07 NOTE — Progress Notes (Signed)
   New Patient Visit  Subjective  Tiffany Hanson is a 74 y.o. female who presents for the following: Skin Problem (Pt c/o irritated brown moles around her eyes growing x several years ).  The following portions of the chart were reviewed this encounter and updated as appropriate:  Tobacco  Allergies  Meds  Problems  Med Hx  Surg Hx  Fam Hx     Review of Systems:  No other skin or systemic complaints except as noted in HPI or Assessment and Plan.  Objective  Well appearing patient in no apparent distress; mood and affect are within normal limits.  A focused examination was performed including face. Relevant physical exam findings are noted in the Assessment and Plan.  Objective  bilateral upper eyelids x6, L temple x 1 (7): Erythematous keratotic or waxy stuck-on papule or plaque.   Objective  Head - Anterior (Face): Red papules.    Assessment & Plan  Inflamed seborrheic keratosis (7) bilateral upper eyelids x6, L temple x 1  Destruction of lesion - bilateral upper eyelids x6, L temple x 1  Destruction method: electrodesiccation   Destruction method comment:  Electrodessication Informed consent: discussed and consent obtained   Outcome: patient tolerated procedure well with no complications   Post-procedure details: wound care instructions given   Additional details:  Prior to procedure, discussed risks of blister formation, small wound, skin dyspigmentation, or rare scar following cryotherapy.  Hemangioma of skin Head - Anterior (Face) Benign-appearing.  Observation.  Call clinic for new or changing moles.  Recommend daily use of broad spectrum spf 30+ sunscreen to sun-exposed areas.    Return in about 2 months (around 09/06/2020).  IMarye Round, CMA, am acting as scribe for Sarina Ser, MD .  Documentation: I have reviewed the above documentation for accuracy and completeness, and I agree with the above.  Sarina Ser, MD

## 2020-07-08 ENCOUNTER — Encounter: Payer: Self-pay | Admitting: Dermatology

## 2020-07-29 DIAGNOSIS — Z7984 Long term (current) use of oral hypoglycemic drugs: Secondary | ICD-10-CM | POA: Diagnosis not present

## 2020-07-29 DIAGNOSIS — E1122 Type 2 diabetes mellitus with diabetic chronic kidney disease: Secondary | ICD-10-CM | POA: Diagnosis not present

## 2020-07-29 DIAGNOSIS — I129 Hypertensive chronic kidney disease with stage 1 through stage 4 chronic kidney disease, or unspecified chronic kidney disease: Secondary | ICD-10-CM | POA: Diagnosis not present

## 2020-07-29 DIAGNOSIS — E1165 Type 2 diabetes mellitus with hyperglycemia: Secondary | ICD-10-CM | POA: Diagnosis not present

## 2020-08-02 DIAGNOSIS — E119 Type 2 diabetes mellitus without complications: Secondary | ICD-10-CM | POA: Diagnosis not present

## 2020-09-01 DIAGNOSIS — E1129 Type 2 diabetes mellitus with other diabetic kidney complication: Secondary | ICD-10-CM | POA: Diagnosis not present

## 2020-09-08 ENCOUNTER — Other Ambulatory Visit: Payer: Self-pay

## 2020-09-08 ENCOUNTER — Ambulatory Visit (INDEPENDENT_AMBULATORY_CARE_PROVIDER_SITE_OTHER): Payer: Medicare HMO | Admitting: Dermatology

## 2020-09-08 DIAGNOSIS — L708 Other acne: Secondary | ICD-10-CM

## 2020-09-08 DIAGNOSIS — L820 Inflamed seborrheic keratosis: Secondary | ICD-10-CM

## 2020-09-08 NOTE — Progress Notes (Addendum)
   Follow-Up Visit   Subjective  Tiffany Hanson is a 74 y.o. female who presents for the following: Skin Problem (Pt presents for irritated spots around her eyes and nose, ).  The following portions of the chart were reviewed this encounter and updated as appropriate:  Tobacco  Allergies  Meds  Problems  Med Hx  Surg Hx  Fam Hx     Review of Systems:  No other skin or systemic complaints except as noted in HPI or Assessment and Plan.  Objective  Well appearing patient in no apparent distress; mood and affect are within normal limits.  A focused examination was performed including face. Relevant physical exam findings are noted in the Assessment and Plan.  Objective  Nose (16): Erythematous keratotic or waxy stuck-on papule or plaque.   Objective  face, nose: Comedones on the nose and temples    Assessment & Plan  Inflamed seborrheic keratosis (16) Nose Destruction of lesion - Nose Complexity: simple   Destruction method: electrodesiccation    Timeout:  patient name, date of birth, surgical site, and procedure verified; verbal consent verified. Outcome: patient tolerated procedure well with no complications    Acne - comedonal - chronic face, nose Open comedones Start otc Differin 0.1% cream apply to aa face qhs   Return in about 1 year (around 09/08/2021).  IMarye Round, CMA, am acting as scribe for Sarina Ser, MD .  Documentation: I have reviewed the above documentation for accuracy and completeness, and I agree with the above.  Sarina Ser, MD

## 2020-09-09 ENCOUNTER — Encounter: Payer: Self-pay | Admitting: Dermatology

## 2020-09-28 DIAGNOSIS — I129 Hypertensive chronic kidney disease with stage 1 through stage 4 chronic kidney disease, or unspecified chronic kidney disease: Secondary | ICD-10-CM | POA: Diagnosis not present

## 2020-09-28 DIAGNOSIS — Z7984 Long term (current) use of oral hypoglycemic drugs: Secondary | ICD-10-CM | POA: Diagnosis not present

## 2020-09-28 DIAGNOSIS — E1122 Type 2 diabetes mellitus with diabetic chronic kidney disease: Secondary | ICD-10-CM | POA: Diagnosis not present

## 2020-09-28 DIAGNOSIS — E1165 Type 2 diabetes mellitus with hyperglycemia: Secondary | ICD-10-CM | POA: Diagnosis not present

## 2020-10-01 DIAGNOSIS — E119 Type 2 diabetes mellitus without complications: Secondary | ICD-10-CM | POA: Diagnosis not present

## 2020-10-31 DIAGNOSIS — E119 Type 2 diabetes mellitus without complications: Secondary | ICD-10-CM | POA: Diagnosis not present

## 2020-11-01 DIAGNOSIS — J209 Acute bronchitis, unspecified: Secondary | ICD-10-CM | POA: Diagnosis not present

## 2020-11-01 DIAGNOSIS — Z681 Body mass index (BMI) 19 or less, adult: Secondary | ICD-10-CM | POA: Diagnosis not present

## 2020-11-30 DIAGNOSIS — E119 Type 2 diabetes mellitus without complications: Secondary | ICD-10-CM | POA: Diagnosis not present

## 2020-12-07 IMAGING — MG DIGITAL SCREENING BILAT W/ TOMO W/ CAD
6 of 10 series · 6 of 30 positions shown · non-contrast
Comparison: Previous exam(s).

CLINICAL DATA: Screening.

EXAM:
DIGITAL SCREENING BILATERAL MAMMOGRAM WITH TOMO AND CAD

[L MLO synth-2D]
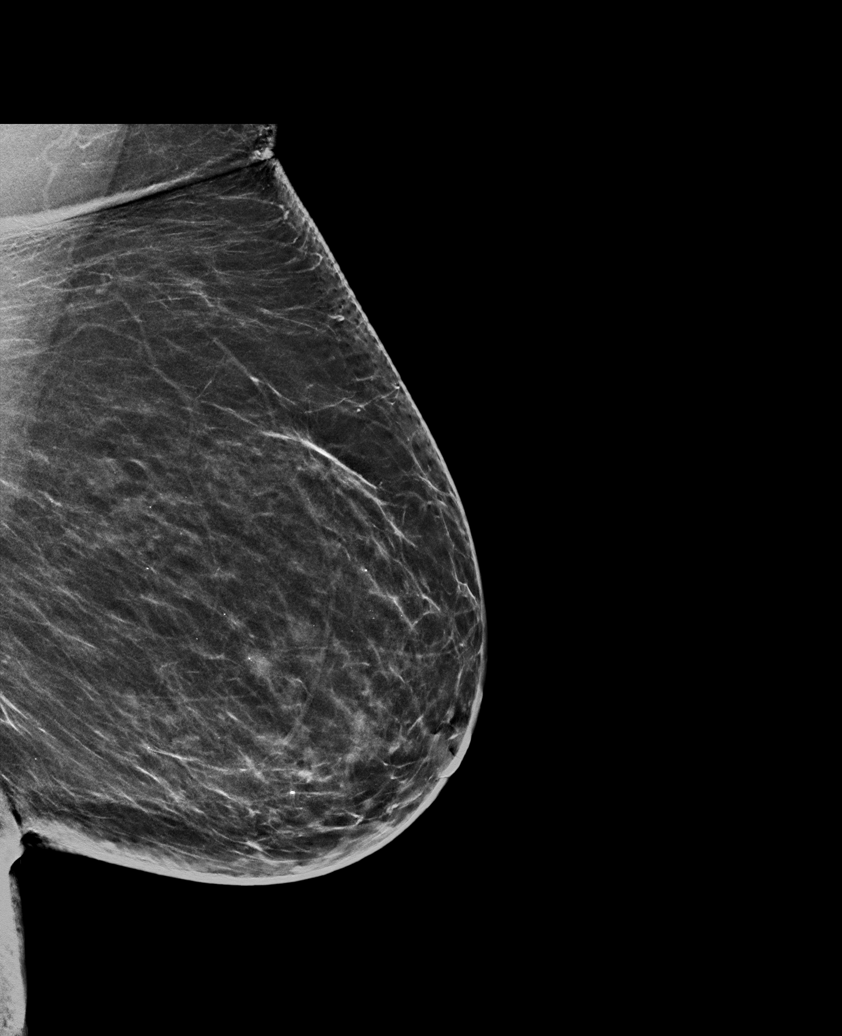

[R CC synth-2D]
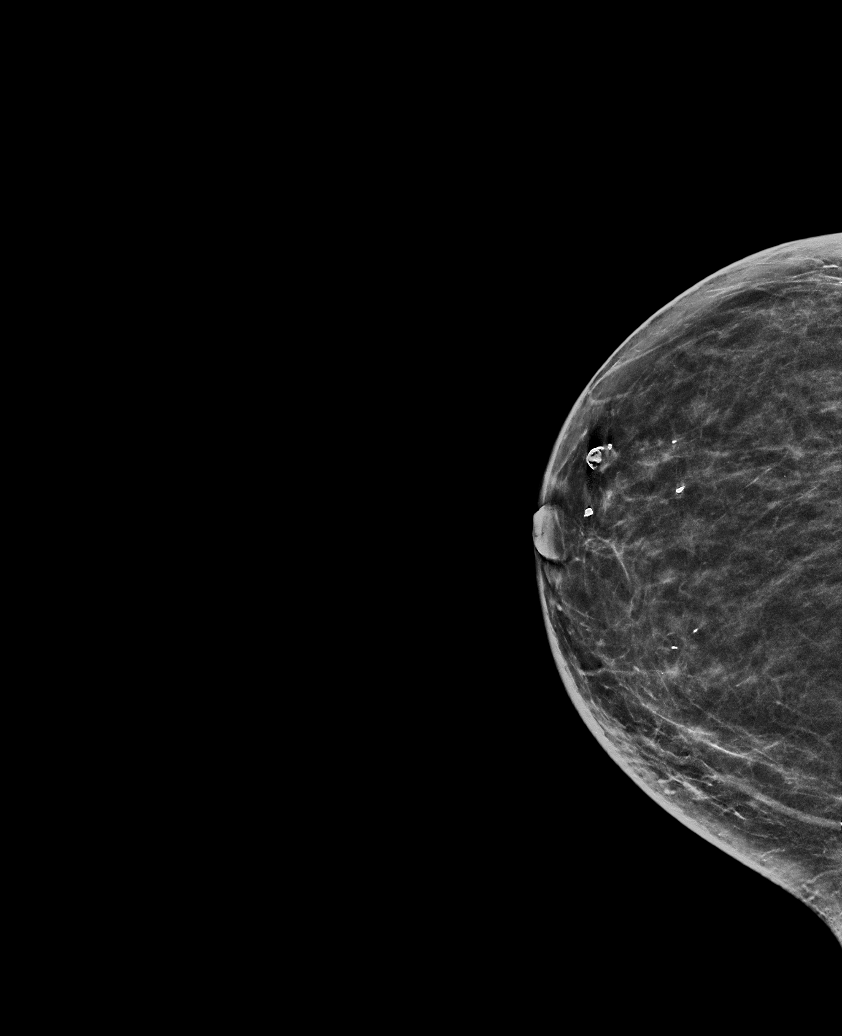

[R MLO synth-2D (1 of 2)]
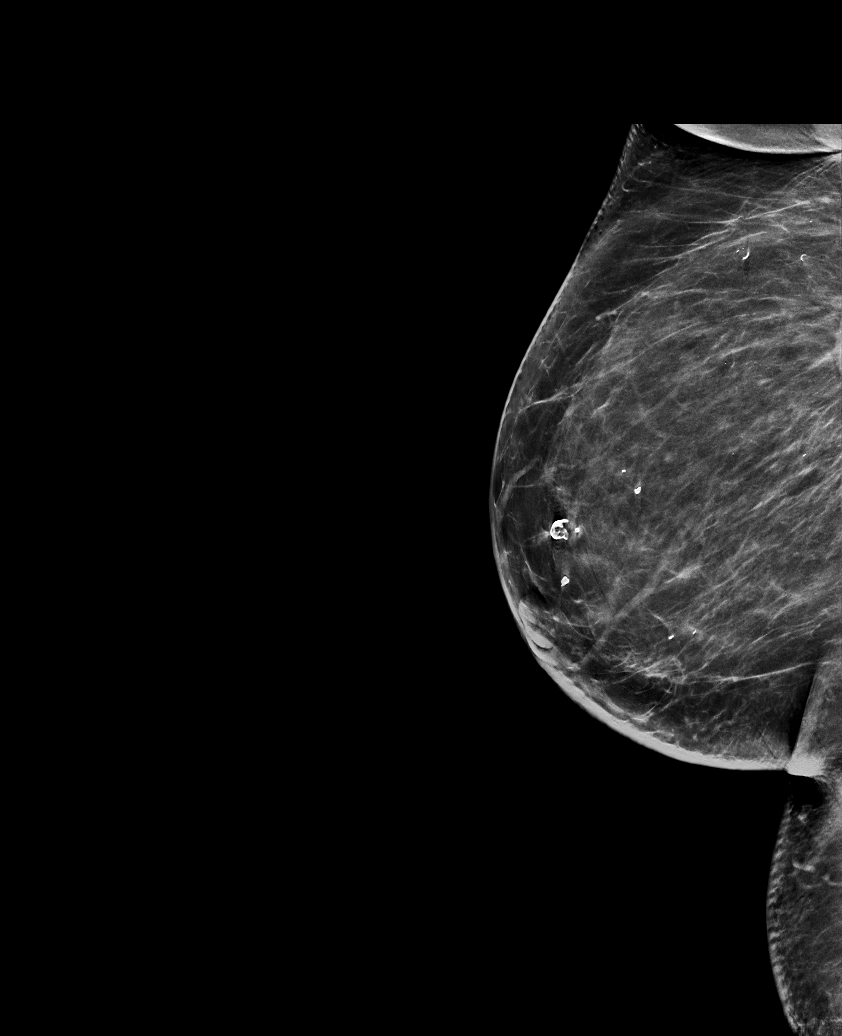

[L CC synth-2D]
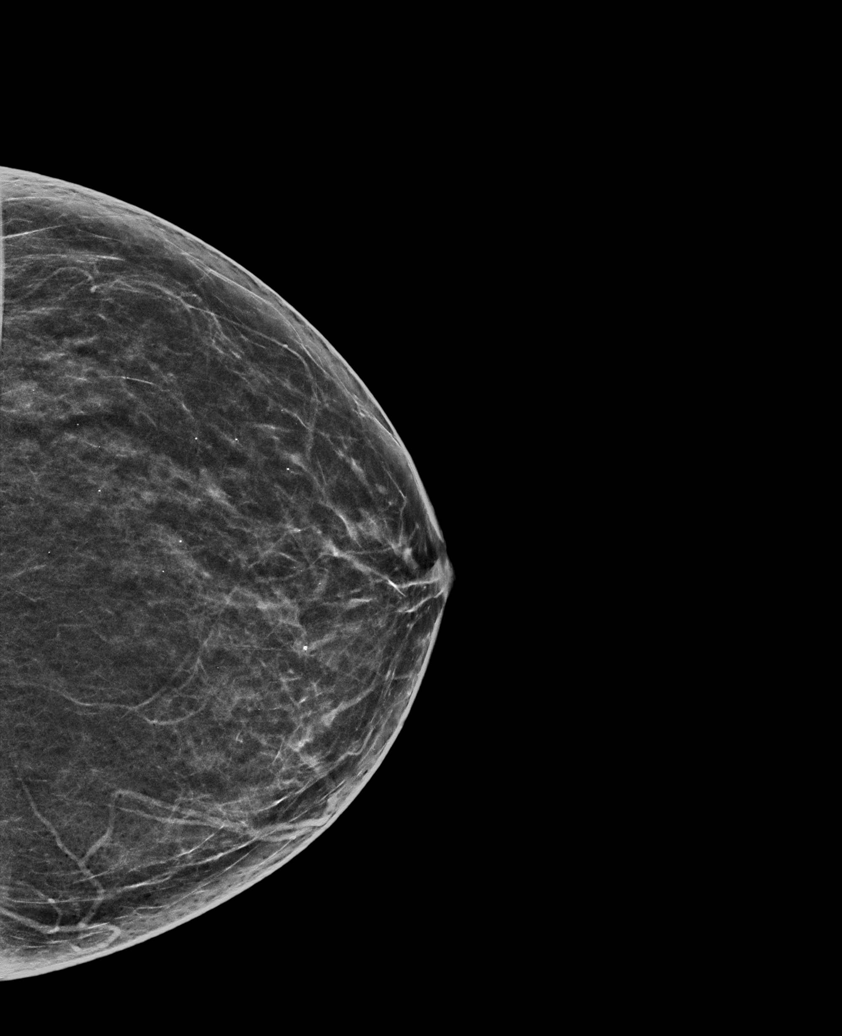

[R MLO synth-2D (2 of 2)]
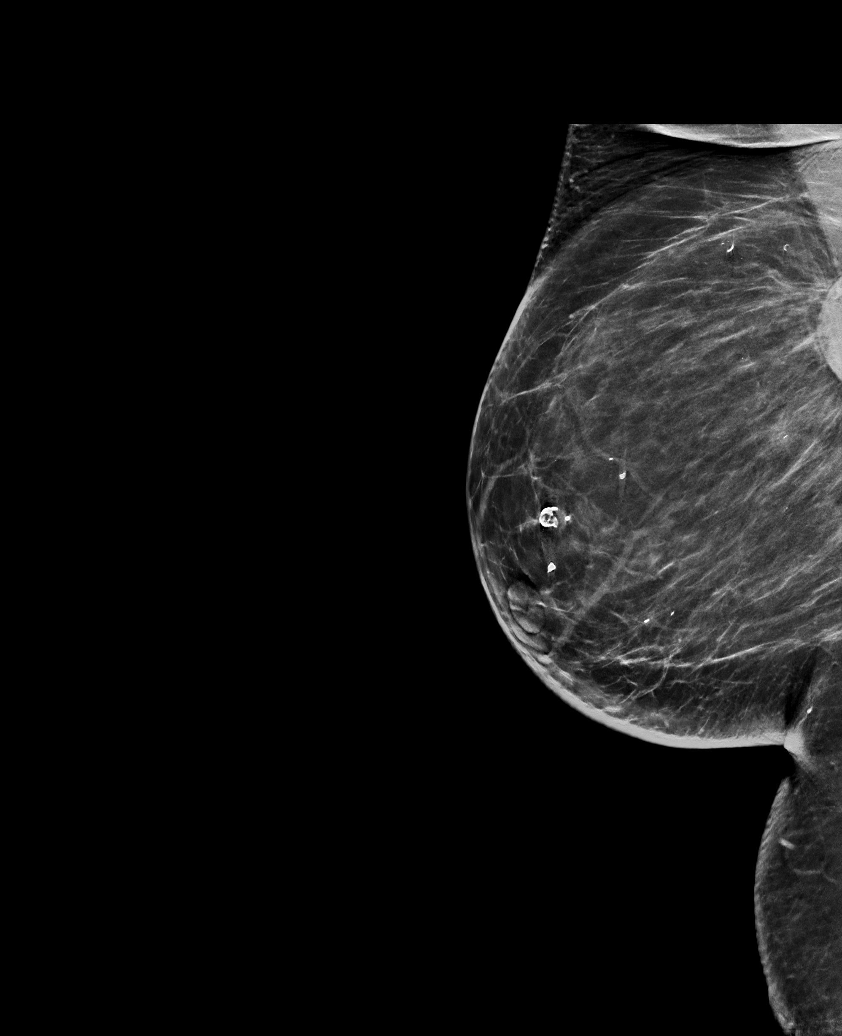

[R CC tomo · tomo slice 33/66.0]
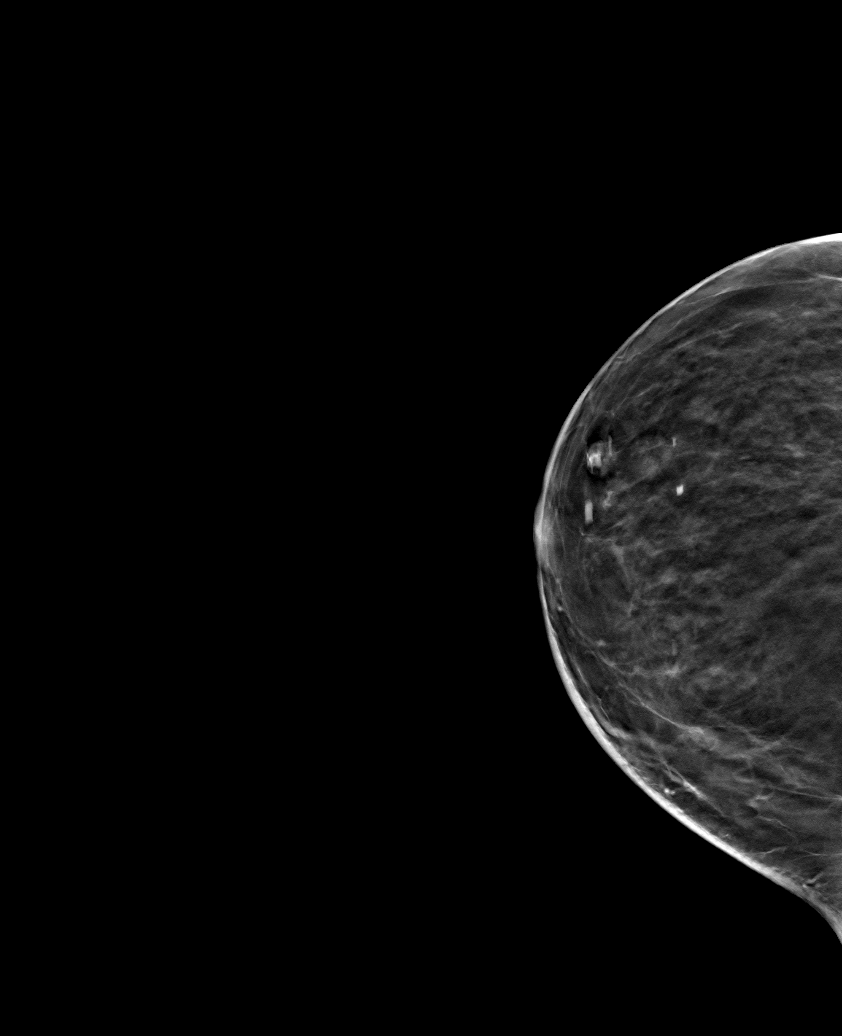

[6 of 30 positions shown; findings below may reference images not displayed]

ACR Breast Density Category b: There are scattered areas of
fibroglandular density.
FINDINGS: There are no findings suspicious for malignancy. Images were
processed with CAD.
IMPRESSION: No mammographic evidence of malignancy. A result letter of this
screening mammogram will be mailed directly to the patient.

RECOMMENDATION:
Screening mammogram in one year. (Code:[id number])

BI-RADS CATEGORY  1: Negative.

## 2020-12-21 DIAGNOSIS — Z0000 Encounter for general adult medical examination without abnormal findings: Secondary | ICD-10-CM | POA: Diagnosis not present

## 2020-12-21 DIAGNOSIS — Z6835 Body mass index (BMI) 35.0-35.9, adult: Secondary | ICD-10-CM | POA: Diagnosis not present

## 2020-12-21 DIAGNOSIS — Z1389 Encounter for screening for other disorder: Secondary | ICD-10-CM | POA: Diagnosis not present

## 2020-12-21 DIAGNOSIS — E1165 Type 2 diabetes mellitus with hyperglycemia: Secondary | ICD-10-CM | POA: Diagnosis not present

## 2020-12-21 DIAGNOSIS — E119 Type 2 diabetes mellitus without complications: Secondary | ICD-10-CM | POA: Diagnosis not present

## 2020-12-21 DIAGNOSIS — E6609 Other obesity due to excess calories: Secondary | ICD-10-CM | POA: Diagnosis not present

## 2020-12-28 ENCOUNTER — Other Ambulatory Visit (HOSPITAL_COMMUNITY): Payer: Self-pay | Admitting: Adult Health

## 2020-12-28 DIAGNOSIS — Z1231 Encounter for screening mammogram for malignant neoplasm of breast: Secondary | ICD-10-CM

## 2020-12-29 DIAGNOSIS — Z1211 Encounter for screening for malignant neoplasm of colon: Secondary | ICD-10-CM | POA: Diagnosis not present

## 2020-12-29 DIAGNOSIS — Z1212 Encounter for screening for malignant neoplasm of rectum: Secondary | ICD-10-CM | POA: Diagnosis not present

## 2020-12-30 DIAGNOSIS — E119 Type 2 diabetes mellitus without complications: Secondary | ICD-10-CM | POA: Diagnosis not present

## 2021-01-07 LAB — COLOGUARD: COLOGUARD: NEGATIVE

## 2021-01-12 ENCOUNTER — Ambulatory Visit (HOSPITAL_COMMUNITY)
Admission: RE | Admit: 2021-01-12 | Discharge: 2021-01-12 | Disposition: A | Discharge status: Home / Self Care | Payer: Medicare HMO | Source: Ambulatory Visit | Attending: Adult Health | Admitting: Adult Health

## 2021-01-12 ENCOUNTER — Other Ambulatory Visit: Payer: Self-pay

## 2021-01-12 DIAGNOSIS — Z1231 Encounter for screening mammogram for malignant neoplasm of breast: Secondary | ICD-10-CM | POA: Insufficient documentation

## 2021-01-26 DIAGNOSIS — N183 Chronic kidney disease, stage 3 unspecified: Secondary | ICD-10-CM | POA: Diagnosis not present

## 2021-01-26 DIAGNOSIS — I129 Hypertensive chronic kidney disease with stage 1 through stage 4 chronic kidney disease, or unspecified chronic kidney disease: Secondary | ICD-10-CM | POA: Diagnosis not present

## 2021-01-26 DIAGNOSIS — E1122 Type 2 diabetes mellitus with diabetic chronic kidney disease: Secondary | ICD-10-CM | POA: Diagnosis not present

## 2021-01-26 DIAGNOSIS — E1165 Type 2 diabetes mellitus with hyperglycemia: Secondary | ICD-10-CM | POA: Diagnosis not present

## 2021-01-29 DIAGNOSIS — E119 Type 2 diabetes mellitus without complications: Secondary | ICD-10-CM | POA: Diagnosis not present

## 2021-02-28 DIAGNOSIS — E119 Type 2 diabetes mellitus without complications: Secondary | ICD-10-CM | POA: Diagnosis not present

## 2021-03-11 ENCOUNTER — Other Ambulatory Visit: Payer: Self-pay | Admitting: Adult Health

## 2021-03-29 DIAGNOSIS — E1122 Type 2 diabetes mellitus with diabetic chronic kidney disease: Secondary | ICD-10-CM | POA: Diagnosis not present

## 2021-03-29 DIAGNOSIS — E1165 Type 2 diabetes mellitus with hyperglycemia: Secondary | ICD-10-CM | POA: Diagnosis not present

## 2021-03-29 DIAGNOSIS — N183 Chronic kidney disease, stage 3 unspecified: Secondary | ICD-10-CM | POA: Diagnosis not present

## 2021-03-29 DIAGNOSIS — I129 Hypertensive chronic kidney disease with stage 1 through stage 4 chronic kidney disease, or unspecified chronic kidney disease: Secondary | ICD-10-CM | POA: Diagnosis not present

## 2021-03-30 DIAGNOSIS — E119 Type 2 diabetes mellitus without complications: Secondary | ICD-10-CM | POA: Diagnosis not present

## 2021-04-28 DIAGNOSIS — E1165 Type 2 diabetes mellitus with hyperglycemia: Secondary | ICD-10-CM | POA: Diagnosis not present

## 2021-04-28 DIAGNOSIS — N183 Chronic kidney disease, stage 3 unspecified: Secondary | ICD-10-CM | POA: Diagnosis not present

## 2021-04-28 DIAGNOSIS — E1122 Type 2 diabetes mellitus with diabetic chronic kidney disease: Secondary | ICD-10-CM | POA: Diagnosis not present

## 2021-04-28 DIAGNOSIS — I129 Hypertensive chronic kidney disease with stage 1 through stage 4 chronic kidney disease, or unspecified chronic kidney disease: Secondary | ICD-10-CM | POA: Diagnosis not present

## 2021-04-29 DIAGNOSIS — E119 Type 2 diabetes mellitus without complications: Secondary | ICD-10-CM | POA: Diagnosis not present

## 2021-05-29 DIAGNOSIS — N183 Chronic kidney disease, stage 3 unspecified: Secondary | ICD-10-CM | POA: Diagnosis not present

## 2021-05-29 DIAGNOSIS — E1165 Type 2 diabetes mellitus with hyperglycemia: Secondary | ICD-10-CM | POA: Diagnosis not present

## 2021-05-29 DIAGNOSIS — E1122 Type 2 diabetes mellitus with diabetic chronic kidney disease: Secondary | ICD-10-CM | POA: Diagnosis not present

## 2021-05-29 DIAGNOSIS — I129 Hypertensive chronic kidney disease with stage 1 through stage 4 chronic kidney disease, or unspecified chronic kidney disease: Secondary | ICD-10-CM | POA: Diagnosis not present

## 2021-05-30 DIAGNOSIS — E119 Type 2 diabetes mellitus without complications: Secondary | ICD-10-CM | POA: Diagnosis not present

## 2021-06-30 DIAGNOSIS — E119 Type 2 diabetes mellitus without complications: Secondary | ICD-10-CM | POA: Diagnosis not present

## 2021-07-30 DIAGNOSIS — E119 Type 2 diabetes mellitus without complications: Secondary | ICD-10-CM | POA: Diagnosis not present

## 2021-08-02 ENCOUNTER — Other Ambulatory Visit: Payer: Self-pay

## 2021-08-02 ENCOUNTER — Ambulatory Visit
Admission: EM | Admit: 2021-08-02 | Discharge: 2021-08-02 | Disposition: A | Discharge status: Home / Self Care | Payer: Medicare HMO | Attending: Emergency Medicine | Admitting: Emergency Medicine

## 2021-08-02 DIAGNOSIS — R21 Rash and other nonspecific skin eruption: Secondary | ICD-10-CM | POA: Diagnosis not present

## 2021-08-02 MED ORDER — TRIAMCINOLONE ACETONIDE 0.1 % EX CREA: 1.0000 "application " | TOPICAL_CREAM | Freq: Two times a day (BID) | CUTANEOUS | 0 refills | Status: DC

## 2021-08-02 NOTE — Discharge Instructions (Addendum)
Discontinue Claritin-D and Benadryl.  They are both antihistamines and too much can give you a lot of side effects.  Try Zyrtec instead.  Try the triamcinolone cream for itching.  Follow-up with your doctor or here if it spreads.

## 2021-08-02 NOTE — ED Triage Notes (Signed)
Patient presents to Urgent Care with complaints of rash located on right arm and right leg. Pt states she took benadryl for rash.   Denies fever, changes in diet, meds, or products.

## 2021-08-02 NOTE — ED Provider Notes (Signed)
HPI  SUBJECTIVE:  Tiffany Hanson is a 75 y.o. female who presents with pruritic, erythematous papules on her right lower and upper extremity starting 2 days ago while [place] the beach.  It has not changed or spread since it started.  Her husband also has an identical rash over the past 2 days.  He was evaluated and thought to have allergies.  She states the itching is worse at night.  No pain, burning, no sensation being bitten at night, blood on the bedclothes in the morning.  She has checked for bedbugs and has not found any.  No pets in the home.  No new lotions, soaps, detergents, foods, medications, fevers, body aches, flulike symptoms.  She has not been working outside.  She has been taking Benadryl in addition to Claritin-D which she takes on an ongoing basis.  No alleviating factors.  She is a borderline diabetic, takes Shanda Bumps and has a history of hypertension.  Of note, she states she had a similar rash several weeks ago.  It lasted a week, but resolved with Benadryl.   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   Lichen sclerosus 05/24/3663   Osteopenia    Personal history of chemotherapy    Right breast   Personal history of radiation therapy    Right breast   S/P radiation therapy    completed in April 2010   Superficial fungus infection of skin 04/27/2015   Vaginal Pap smear, abnormal    Vitiligo     Past Surgical History:  Procedure Laterality Date   BLADDER SUSPENSION     17 to 18 years ago   BREAST LUMPECTOMY  08/20/08   HYSTEROSCOPY     TUBAL LIGATION  1993    Family History  Problem Relation Age of Onset   Heart attack Mother    Diabetes Mother    Cancer Maternal Aunt        breast   Cancer Paternal Aunt        breast   Stroke Father     Heart disease Father    Lupus Sister    Stroke Paternal Grandmother    Diabetes Brother    Other Brother        spinal meningitis   Stroke Brother    Sleep apnea Son    Hearing loss Son    Cancer Son        on kidney   Other Son        hip surgery    Social History   Tobacco Use   Smoking status: Never   Smokeless tobacco: Never  Vaping Use   Vaping Use: Never used  Substance Use Topics   Alcohol use: No   Drug use: No    No current facility-administered medications for this encounter.  Current Outpatient Medications:    triamcinolone cream (KENALOG) 0.1 %, Apply 1 application topically 2 (two) times daily. Apply for 2 weeks. May use on face, Disp: 30 g, Rfl: 0   ACCU-CHEK FASTCLIX LANCETS MISC, , Disp: , Rfl:    amLODipine (NORVASC) 5 MG tablet, Take 5 mg by mouth daily., Disp: , Rfl:    Cholecalciferol (VITAMIN D-3) 5000 UNITS TABS, Take 1 tablet by mouth daily., Disp: ,  Rfl:    fish oil-omega-3 fatty acids 1000 MG capsule, Take 1,200 mg by mouth daily., Disp: , Rfl:    glimepiride (AMARYL) 1 MG tablet, Take 1 mg by mouth daily before breakfast. , Disp: , Rfl:    loratadine (CLARITIN) 10 MG tablet, Take 10 mg by mouth daily., Disp: , Rfl:    nystatin (MYCOSTATIN/NYSTOP) 100000 UNIT/GM POWD, Use 2-3 x daily to affected area prn, Disp: 30 g, Rfl: 1   ONE TOUCH ULTRA TEST test strip, , Disp: , Rfl:    pantoprazole (PROTONIX) 40 MG tablet, Take 40 mg by mouth daily., Disp: , Rfl:    simvastatin (ZOCOR) 20 MG tablet, Take 20 mg by mouth every evening., Disp: , Rfl:   No Known Allergies   ROS  As noted in HPI.   Physical Exam  BP 124/77 (BP Location: Right Arm)   Pulse 60   Temp (!) 97.5 F (36.4 C) (Oral)   Resp 16   SpO2 95%   Constitutional: Well developed, well nourished, no acute distress Eyes:  EOMI, conjunctiva normal bilaterally HENT: Normocephalic, atraumatic,mucus membranes moist Respiratory: Normal inspiratory effort Cardiovascular: Normal  rate GI: nondistended skin: 2 erythematous, nontender papules right lower extremity   Single nontender papule right upper extremity   No burrows between fingers  Musculoskeletal: no deformities Neurologic: Alert & oriented x 3, no focal neuro deficits Psychiatric: Speech and behavior appropriate   ED Course   Medications - No data to display  No orders of the defined types were placed in this encounter.   No results found for this or any previous visit (from the past 24 hour(s)). No results found.  ED Clinical Impression  1. Rash and nonspecific skin eruption      ED Assessment/Plan  Suspect insect bites of some sort, especially since husband has identical symptoms,, but patient states that she has checked for bedbugs, they have no known exposure to fleas.  It does not appear to be scabies.  Will send home with triamcinolone cream, discontinue Benadryl and Claritin-D and start Zyrtec instead.  Monitor rash.  Follow-up with PMD or here if it spreads.  Discussed MDM, treatment plan, and plan for follow-up with patient. patient agrees with plan.   Meds ordered this encounter  Medications   triamcinolone cream (KENALOG) 0.1 %    Sig: Apply 1 application topically 2 (two) times daily. Apply for 2 weeks. May use on face    Dispense:  30 g    Refill:  0      *This clinic note was created using Lobby[employer]. Therefore, there may be occasional mistakes despite careful proofreading.  ?    Melynda Ripple, MD 08/03/21 [phone]

## 2021-08-30 DIAGNOSIS — E119 Type 2 diabetes mellitus without complications: Secondary | ICD-10-CM | POA: Diagnosis not present

## 2021-09-15 ENCOUNTER — Ambulatory Visit (INDEPENDENT_AMBULATORY_CARE_PROVIDER_SITE_OTHER): Payer: Medicare HMO | Admitting: Dermatology

## 2021-09-15 ENCOUNTER — Other Ambulatory Visit: Payer: Self-pay

## 2021-09-15 DIAGNOSIS — L709 Acne, unspecified: Secondary | ICD-10-CM | POA: Diagnosis not present

## 2021-09-15 DIAGNOSIS — L821 Other seborrheic keratosis: Secondary | ICD-10-CM

## 2021-09-15 DIAGNOSIS — Z872 Personal history of diseases of the skin and subcutaneous tissue: Secondary | ICD-10-CM | POA: Diagnosis not present

## 2021-09-15 DIAGNOSIS — Z1283 Encounter for screening for malignant neoplasm of skin: Secondary | ICD-10-CM

## 2021-09-15 MED ORDER — TRETINOIN 0.025 % EX CREA: TOPICAL_CREAM | Freq: Every day | CUTANEOUS | 11 refills | Status: AC

## 2021-09-15 NOTE — Progress Notes (Signed)
   Follow-Up Visit   Subjective  Tiffany Hanson is a 75 y.o. female who presents for the following: Annual Exam (Patient is here today for 1 year acne follow up. She has other spots to be evaluated today. She has history of isks treated in the past.  The following portions of the chart were reviewed this encounter and updated as appropriate:  Tobacco  Allergies  Meds  Problems  Med Hx  Surg Hx  Fam Hx     Review of Systems: No other skin or systemic complaints except as noted in HPI or Assessment and Plan.  Objective  Well appearing patient in no apparent distress; mood and affect are within normal limits.  A focused examination was performed including face. Relevant physical exam findings are noted in the Assessment and Plan.  Nose Comodal acne of nasal bridge   Assessment & Plan  Acne Nose Comedonal  acne  Chronic condition with duration or expected duration over one year. Condition is bothersome to patient. Currently flared. Tretinion 0.025 apply a peasized amount to nose at bedtime around nose if insurance doesn't cover   2 arazlo samples given to patient in office  12/2021 [id number]  Topical retinoid medications like tretinoin/Retin-A, adapalene/Differin, tazarotene/Fabior, and Epiduo/Epiduo Forte can cause dryness and irritation when first started. Only apply a pea-sized amount to the entire affected area. Avoid applying it around the eyes, edges of mouth and creases at the nose. If you experience irritation, use a good moisturizer first and/or apply the medicine less often. If you are doing well with the medicine, you can increase how often you use it until you are applying every night. Be careful with sun protection while using this medication as it can make you sensitive to the sun. This medicine should not be used by pregnant women.   tretinoin (RETIN-A) 0.025 % cream - Nose Apply topically at bedtime. Use pea sized amount to affected areas of nose  Seborrheic  Keratoses - Stuck-on, waxy, tan-brown papules and/or plaques  - Benign-appearing - Discussed benign etiology and prognosis. - Observe - Call for any changes  Return for 1 year acne follow up. IRuthell Rummage, CMA, am acting as scribe for Sarina Ser, MD. Documentation: I have reviewed the above documentation for accuracy and completeness, and I agree with the above.  Sarina Ser, MD

## 2021-09-15 NOTE — Patient Instructions (Addendum)
Topical retinoid medications like tretinoin/Retin-A, adapalene/Differin, tazarotene/Fabior, and Epiduo/Epiduo Forte can cause dryness and irritation when first started. Only apply a pea-sized amount to the entire affected area. Avoid applying it around the eyes, edges of mouth and creases at the nose. If you experience irritation, use a good moisturizer first and/or apply the medicine less often. If you are doing well with the medicine, you can increase how often you use it until you are applying every night. Be careful with sun protection while using this medication as it can make you sensitive to the sun. This medicine should not be used by pregnant women.         If You Need Anything After Your Visit  If you have any questions or concerns for your doctor, please call our main line at [phone] and press option 4 to reach your doctor's medical assistant. If no one answers, please leave a voicemail as directed and we will return your call as soon as possible. Messages left after 4 pm will be answered the following business day.   You may also send [place] a message via [place]. We typically respond to MyChart messages within 1-2 business days.  For prescription refills, please ask your pharmacy to contact our office. Our fax number is [phone].  If you have an urgent issue when the clinic is closed that cannot wait until the next business day, you can page your doctor at the number below.    Please note that while we do our best to be available for urgent issues outside of office hours, we are not available 24/7.   If you have an urgent issue and are unable to reach [place], you may choose to seek medical care at your doctor's office, retail clinic, urgent care center, or emergency room.  If you have a medical emergency, please immediately call 911 or go to the emergency department.  Pager Numbers  - Dr. Nehemiah Massed: [phone]  - Dr. Laurence Ferrari: [phone]  - Dr. Nicole Kindred: [phone]  In  the event of inclement weather, please call our main line at [phone] for an update on the status of any delays or closures.  Dermatology Medication Tips: Please keep the boxes that topical medications come in in order to help keep track of the instructions about where and how to use these. Pharmacies typically print the medication instructions only on the boxes and not directly on the medication tubes.   If your medication is too expensive, please contact our office at [phone] option 4 or send [place] a message through [place].   We are unable to tell what your co-pay for medications will be in advance as this is different depending on your insurance coverage. However, we may be able to find a substitute medication at lower cost or fill out paperwork to get insurance to cover a needed medication.   If a prior authorization is required to get your medication covered by your insurance company, please allow [place] 1-2 business days to complete this process.  Drug prices often vary depending on where the prescription is filled and some pharmacies may offer cheaper prices.  The website www.goodrx.com contains coupons for medications through different pharmacies. The prices here do not account for what the cost may be with help from insurance (it may be cheaper with your insurance), but the website can give you the price if you did not use any insurance.  - You can print the associated coupon and take it with your prescription to the pharmacy.  -  You may also stop by our office during regular business hours and pick up a GoodRx coupon card.  - If you need your prescription sent electronically to a different pharmacy, notify our office through [facility] or by phone at [phone] option 4.     Si Usted Necesita Algo Despus de Su Visita  Tambin puede enviarnos un mensaje a travs de Pharmacist, community. Por lo general respondemos a los mensajes de MyChart en el transcurso de 1 a 2 das  hbiles.  Para renovar recetas, por favor pida a su farmacia que se ponga en contacto con nuestra oficina. Harland Dingwall de fax es [place] [phone].  Si tiene un asunto urgente cuando la clnica est cerrada y que no puede esperar hasta el siguiente da hbil, puede llamar/localizar a su doctor(a) al nmero que aparece a continuacin.   Por favor, tenga en cuenta que aunque hacemos todo lo posible para estar disponibles para asuntos urgentes fuera del horario de [place], no estamos disponibles las 24 horas del da, los 7 das de la [place].   Si tiene un problema urgente y no puede comunicarse con nosotros, puede optar por buscar atencin mdica  en el consultorio de su doctor(a), en una clnica privada, en un centro de atencin urgente o en una sala de emergencias.  Si tiene Engineering geologist, por favor llame inmediatamente al 911 o vaya a la sala de emergencias.  Nmeros de bper  - Dr. Nehemiah Massed: [phone]  - Dra. Moye: [phone]  - Dra. Nicole Kindred: [phone]  En caso de inclemencias del [place], por favor llame a Johnsie Kindred principal al [phone] para una actualizacin sobre el [place] de cualquier retraso o cierre.  Consejos para la medicacin en dermatologa: Por favor, guarde las cajas en las que vienen los medicamentos de uso tpico para ayudarle a seguir las instrucciones sobre dnde y cmo usarlos. Las farmacias generalmente imprimen las instrucciones del medicamento slo en las cajas y no directamente en los tubos del [place].   Si su medicamento es muy caro, por favor, pngase en contacto con Zigmund Daniel llamando al [phone] y presione la opcin 4 o envenos un mensaje a travs de Pharmacist, community.   No podemos decirle cul ser su copago por los medicamentos por adelantado ya que esto es diferente dependiendo de la cobertura de su seguro. Sin embargo, es posible que podamos encontrar un medicamento sustituto a Electrical engineer un formulario para que el  seguro cubra el medicamento que se considera necesario.   Si se requiere una autorizacin previa para que su compaa de seguros [place] su medicamento, por favor permtanos de 1 a 2 das hbiles para completar este proceso.  Los precios de los medicamentos varan con frecuencia dependiendo del Environmental consultant de dnde se surte la receta y alguna farmacias pueden ofrecer precios ms baratos.  El sitio web www.goodrx.com tiene cupones para medicamentos de Airline pilot. Los precios aqu no tienen en cuenta lo que podra costar con la ayuda del seguro (puede ser ms barato con su seguro), pero el sitio web puede darle el precio si no utiliz Research scientist (physical sciences).  - Puede imprimir el cupn correspondiente y llevarlo con su receta a la farmacia.  - Tambin puede pasar por nuestra oficina durante el horario de atencin regular y Charity fundraiser una tarjeta de cupones de GoodRx.  - Si necesita que su receta se enve electrnicamente a Chiropodist, informe a nuestra oficina a travs de MyChart de [place] o por telfono llamando al [phone] y  presione la opcin 4.

## 2021-09-27 ENCOUNTER — Encounter: Payer: Self-pay | Admitting: Dermatology

## 2021-09-29 DIAGNOSIS — E119 Type 2 diabetes mellitus without complications: Secondary | ICD-10-CM | POA: Diagnosis not present

## 2021-10-11 DIAGNOSIS — E119 Type 2 diabetes mellitus without complications: Secondary | ICD-10-CM | POA: Diagnosis not present

## 2021-10-28 DIAGNOSIS — N183 Chronic kidney disease, stage 3 unspecified: Secondary | ICD-10-CM | POA: Diagnosis not present

## 2021-10-28 DIAGNOSIS — E1122 Type 2 diabetes mellitus with diabetic chronic kidney disease: Secondary | ICD-10-CM | POA: Diagnosis not present

## 2021-10-28 DIAGNOSIS — I129 Hypertensive chronic kidney disease with stage 1 through stage 4 chronic kidney disease, or unspecified chronic kidney disease: Secondary | ICD-10-CM | POA: Diagnosis not present

## 2021-10-30 DIAGNOSIS — E119 Type 2 diabetes mellitus without complications: Secondary | ICD-10-CM | POA: Diagnosis not present

## 2021-11-29 DIAGNOSIS — I129 Hypertensive chronic kidney disease with stage 1 through stage 4 chronic kidney disease, or unspecified chronic kidney disease: Secondary | ICD-10-CM | POA: Diagnosis not present

## 2021-11-29 DIAGNOSIS — E1122 Type 2 diabetes mellitus with diabetic chronic kidney disease: Secondary | ICD-10-CM | POA: Diagnosis not present

## 2021-11-29 DIAGNOSIS — N183 Chronic kidney disease, stage 3 unspecified: Secondary | ICD-10-CM | POA: Diagnosis not present

## 2021-11-30 DIAGNOSIS — E119 Type 2 diabetes mellitus without complications: Secondary | ICD-10-CM | POA: Diagnosis not present

## 2021-12-30 DIAGNOSIS — E119 Type 2 diabetes mellitus without complications: Secondary | ICD-10-CM | POA: Diagnosis not present

## 2022-01-04 ENCOUNTER — Other Ambulatory Visit (HOSPITAL_COMMUNITY): Payer: Self-pay | Admitting: Physician Assistant

## 2022-01-04 DIAGNOSIS — Z1231 Encounter for screening mammogram for malignant neoplasm of breast: Secondary | ICD-10-CM

## 2022-01-10 DIAGNOSIS — E6609 Other obesity due to excess calories: Secondary | ICD-10-CM | POA: Diagnosis not present

## 2022-01-10 DIAGNOSIS — E1122 Type 2 diabetes mellitus with diabetic chronic kidney disease: Secondary | ICD-10-CM | POA: Diagnosis not present

## 2022-01-10 DIAGNOSIS — K219 Gastro-esophageal reflux disease without esophagitis: Secondary | ICD-10-CM | POA: Diagnosis not present

## 2022-01-10 DIAGNOSIS — E782 Mixed hyperlipidemia: Secondary | ICD-10-CM | POA: Diagnosis not present

## 2022-01-10 DIAGNOSIS — Z1331 Encounter for screening for depression: Secondary | ICD-10-CM | POA: Diagnosis not present

## 2022-01-10 DIAGNOSIS — Z0001 Encounter for general adult medical examination with abnormal findings: Secondary | ICD-10-CM | POA: Diagnosis not present

## 2022-01-10 DIAGNOSIS — Z6835 Body mass index (BMI) 35.0-35.9, adult: Secondary | ICD-10-CM | POA: Diagnosis not present

## 2022-01-10 DIAGNOSIS — I10 Essential (primary) hypertension: Secondary | ICD-10-CM | POA: Diagnosis not present

## 2022-01-16 ENCOUNTER — Other Ambulatory Visit: Payer: Self-pay

## 2022-01-16 ENCOUNTER — Ambulatory Visit (HOSPITAL_COMMUNITY)
Admission: RE | Admit: 2022-01-16 | Discharge: 2022-01-16 | Disposition: A | Discharge status: Home / Self Care | Payer: Medicare PPO | Source: Ambulatory Visit | Attending: Physician Assistant | Admitting: Physician Assistant

## 2022-01-16 DIAGNOSIS — Z1231 Encounter for screening mammogram for malignant neoplasm of breast: Secondary | ICD-10-CM | POA: Diagnosis not present

## 2022-05-12 DIAGNOSIS — E1165 Type 2 diabetes mellitus with hyperglycemia: Secondary | ICD-10-CM | POA: Diagnosis not present

## 2022-05-12 DIAGNOSIS — E6609 Other obesity due to excess calories: Secondary | ICD-10-CM | POA: Diagnosis not present

## 2022-05-12 DIAGNOSIS — Z6834 Body mass index (BMI) 34.0-34.9, adult: Secondary | ICD-10-CM | POA: Diagnosis not present

## 2022-05-12 DIAGNOSIS — I10 Essential (primary) hypertension: Secondary | ICD-10-CM | POA: Diagnosis not present

## 2022-05-12 DIAGNOSIS — J329 Chronic sinusitis, unspecified: Secondary | ICD-10-CM | POA: Diagnosis not present

## 2022-05-12 DIAGNOSIS — N183 Chronic kidney disease, stage 3 unspecified: Secondary | ICD-10-CM | POA: Diagnosis not present

## 2022-05-15 ENCOUNTER — Other Ambulatory Visit: Payer: Self-pay | Admitting: Adult Health

## 2022-06-08 ENCOUNTER — Ambulatory Visit
Admission: EM | Admit: 2022-06-08 | Discharge: 2022-06-08 | Disposition: A | Discharge status: Home / Self Care | Payer: Medicare PPO | Attending: Urgent Care | Admitting: Urgent Care

## 2022-06-08 DIAGNOSIS — R7303 Prediabetes: Secondary | ICD-10-CM

## 2022-06-08 DIAGNOSIS — R0981 Nasal congestion: Secondary | ICD-10-CM | POA: Diagnosis not present

## 2022-06-08 DIAGNOSIS — R053 Chronic cough: Secondary | ICD-10-CM

## 2022-06-08 DIAGNOSIS — J019 Acute sinusitis, unspecified: Secondary | ICD-10-CM | POA: Diagnosis not present

## 2022-06-08 MED ORDER — DOXYCYCLINE HYCLATE 100 MG PO CAPS: 100.0000 mg | ORAL_CAPSULE | Freq: Two times a day (BID) | ORAL | 0 refills | Status: DC

## 2022-06-08 MED ORDER — PREDNISONE 20 MG PO TABS: 40.0000 mg | ORAL_TABLET | Freq: Every day | ORAL | 0 refills | Status: DC

## 2022-06-08 MED ORDER — PROMETHAZINE-DM 6.25-15 MG/5ML PO SYRP: 2.5000 mL | ORAL_SOLUTION | Freq: Three times a day (TID) | ORAL | 0 refills | Status: DC | PRN

## 2022-06-08 NOTE — Discharge Instructions (Signed)
Please use doxycycline to address a sinus infection. Use prednisone to help with your allergic rhinitis. Use the cough medicine as needed. Continue taking Zyrtec daily.

## 2022-06-08 NOTE — ED Triage Notes (Signed)
Pt reports cough x 7-14 days. She was prescribed Amoxicillin and Tessalon by her previous provider. She reports the Amoxicillin  cause a rash so she stop taking it. She reports cough is worse at night.

## 2022-06-08 NOTE — ED Provider Notes (Signed)
[place]   MRN: [id number] DOB: 1946/09/30  Subjective:   Tiffany Hanson is a 76 y.o. female presenting for 2 week history of persistent sinus congestion, postnasal drainage, coughing.  Patient was prescribed Augmentin and given a steroid injection at a different clinic.  She states that the steroid injection made her feel good for a bit but the Augmentin made her feel worse.  She stopped taking it because she saw a dark spot over her right inner thigh.  She was worried that this was an allergic reaction.  No facial swelling, itching, hives.  She does take Zyrtec daily for perennial allergic rhinitis.  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  Has been on Zyrtec for the past 2 months.  No smoking, history of asthma.  No chest pain, shortness of breath or wheezing.  No current facility-administered medications for this encounter.  Current Outpatient Medications:    ACCU-CHEK FASTCLIX LANCETS MISC, , Disp: , Rfl:    amLODipine (NORVASC) 5 MG tablet, Take 5 mg by mouth daily., Disp: , Rfl:    Cholecalciferol (VITAMIN D-3) 5000 UNITS TABS, Take 1 tablet by mouth daily., Disp: , Rfl:    clobetasol ointment (TEMOVATE) 0.05 %, APPLY TOPICALLY 2 TO 3 TIMES WEEKLY, Disp: 45 g, Rfl: 1   fish oil-omega-3 fatty acids 1000 MG capsule, Take 1,200 mg by mouth daily., Disp: , Rfl:    glimepiride (AMARYL) 1 MG tablet, Take 1 mg by mouth daily before breakfast. , Disp: , Rfl:    loratadine (CLARITIN) 10 MG tablet, Take 10 mg by mouth daily., Disp: , Rfl:    nystatin (MYCOSTATIN/NYSTOP) 100000 UNIT/GM POWD, Use 2-3 x daily to affected area prn, Disp: 30 g, Rfl: 1   ONE TOUCH ULTRA TEST test strip, , Disp: , Rfl:    pantoprazole (PROTONIX) 40 MG tablet, Take 40 mg by mouth daily., Disp: , Rfl:    simvastatin (ZOCOR) 20 MG tablet, Take 20 mg by mouth every evening., Disp: , Rfl:    tretinoin (RETIN-A) 0.025 % cream, Apply topically at bedtime. Use pea sized amount to affected areas of nose,  Disp: 45 g, Rfl: 11   triamcinolone cream (KENALOG) 0.1 %, Apply 1 application topically 2 (two) times daily. Apply for 2 weeks. May use on face, Disp: 30 g, Rfl: 0   No Known Allergies  Past Medical History:  Diagnosis Date   Abnormal Pap smear    Breast cancer ([place]) 12/16/2012   Right breast   Diabetes mellitus without complication (HCC)    GERD (gastroesophageal reflux disease)    Hypercholesterolemia    Hypertension    Leukoplakia, vulva 2/99/2426   Lichen sclerosus 8/34/1962   Osteopenia    Personal history of chemotherapy    Right breast   Personal history of radiation therapy    Right breast   S/P radiation therapy    completed in April 2010   Superficial fungus infection of skin 04/27/2015   Vaginal Pap smear, abnormal    Vitiligo      Past Surgical History:  Procedure Laterality Date   BLADDER SUSPENSION     17 to 18 years ago   BREAST LUMPECTOMY  08/20/08   HYSTEROSCOPY     TUBAL LIGATION  1993    Family History  Problem Relation Age of Onset   Heart attack Mother    Diabetes Mother    Cancer Maternal Aunt        breast   Cancer Paternal Aunt  breast   Stroke Father    Heart disease Father    Lupus Sister    Stroke Paternal Grandmother    Diabetes Brother    Other Brother        spinal meningitis   Stroke Brother    Sleep apnea Son    Hearing loss Son    Cancer Son        on kidney   Other Son        hip surgery    Social History   Tobacco Use   Smoking status: Never   Smokeless tobacco: Never  Vaping Use   Vaping Use: Never used  Substance Use Topics   Alcohol use: No   Drug use: No    ROS   Objective:   Vitals: BP 119/75 (BP Location: Left Arm)   Pulse 84   Temp 98 F (36.7 C) (Oral)   Resp 18   SpO2 95%   Physical Exam Constitutional:      General: She is not in acute distress.    Appearance: Normal appearance. She is well-developed and normal weight. She is not ill-appearing, toxic-appearing or diaphoretic.   HENT:     Head: Normocephalic and atraumatic.     Right Ear: Tympanic membrane, ear canal and external ear normal. No drainage or tenderness. No middle ear effusion. There is no impacted cerumen. Tympanic membrane is not erythematous.     Left Ear: Tympanic membrane, ear canal and external ear normal. No drainage or tenderness.  No middle ear effusion. There is no impacted cerumen. Tympanic membrane is not erythematous.     Nose: Congestion present. No rhinorrhea.     Mouth/Throat:     Mouth: Mucous membranes are moist. No oral lesions.     Pharynx: No pharyngeal swelling, oropharyngeal exudate, posterior oropharyngeal erythema or uvula swelling.     Tonsils: No tonsillar exudate or tonsillar abscesses.  Eyes:     General: No scleral icterus.       Right eye: No discharge.        Left eye: No discharge.     Extraocular Movements: Extraocular movements intact.     Right eye: Normal extraocular motion.     Left eye: Normal extraocular motion.     Conjunctiva/sclera: Conjunctivae normal.  Cardiovascular:     Rate and Rhythm: Normal rate and regular rhythm.     Heart sounds: Normal heart sounds. No murmur heard.    No friction rub. No gallop.  Pulmonary:     Effort: Pulmonary effort is normal. No respiratory distress.     Breath sounds: No stridor. No wheezing, rhonchi or rales.  Chest:     Chest wall: No tenderness.  Musculoskeletal:     Cervical back: Normal range of motion and neck supple.  Lymphadenopathy:     Cervical: No cervical adenopathy.  Skin:    General: Skin is warm and dry.  Neurological:     General: No focal deficit present.     Mental Status: She is alert and oriented to person, place, and time.  Psychiatric:        Mood and Affect: Mood normal.        Behavior: Behavior normal.     Assessment and Plan :   PDMP not reviewed this encounter.  1. Acute non-recurrent sinusitis, unspecified location   2. Sinus congestion   3. Persistent cough   4.  Pre-diabetes    Deferred imaging given clear cardiopulmonary exam, hemodynamically stable vital signs.  Will start empiric treatment for sinusitis with doxycycline given that she cannot tolerate Augmentin.  I did not see any signs of a medication reaction and therefore advised that I would not add it to her medication allergy list.  In the context of her significant allergic rhinitis and persistent coughing, offered an oral prednisone course which the patient does well with.  Recommended supportive care otherwise including the use of oral antihistamine. Counseled patient on potential for adverse effects with medications prescribed/recommended today, ER and return-to-clinic precautions discussed, patient verbalized understanding.    Jaynee Eagles, PA-C 06/08/22 1152

## 2022-07-31 ENCOUNTER — Ambulatory Visit
Admission: EM | Admit: 2022-07-31 | Discharge: 2022-07-31 | Disposition: A | Discharge status: Home / Self Care | Payer: Medicare PPO | Attending: Family Medicine | Admitting: Family Medicine

## 2022-07-31 DIAGNOSIS — J069 Acute upper respiratory infection, unspecified: Secondary | ICD-10-CM | POA: Diagnosis not present

## 2022-07-31 DIAGNOSIS — R509 Fever, unspecified: Secondary | ICD-10-CM

## 2022-07-31 DIAGNOSIS — Z20822 Contact with and (suspected) exposure to covid-19: Secondary | ICD-10-CM | POA: Diagnosis not present

## 2022-07-31 DIAGNOSIS — R052 Subacute cough: Secondary | ICD-10-CM | POA: Diagnosis not present

## 2022-07-31 MED ORDER — AZITHROMYCIN 250 MG PO TABS: ORAL_TABLET | ORAL | 0 refills | Status: DC

## 2022-07-31 MED ORDER — ACETAMINOPHEN 325 MG PO TABS: 650.0000 mg | ORAL_TABLET | Freq: Once | ORAL | Status: DC

## 2022-07-31 MED ORDER — PROMETHAZINE-DM 6.25-15 MG/5ML PO SYRP: 5.0000 mL | ORAL_SOLUTION | Freq: Four times a day (QID) | ORAL | 0 refills | Status: DC | PRN

## 2022-07-31 NOTE — Discharge Instructions (Signed)
We have tested you for COVID and flu today and should have these results back tomorrow.  I suspect that your fever, body aches, scratchy throat are related to a new viral illness unrelated to your ongoing cough for 3 weeks.  I have sent over an antibiotic and cough syrup to help with the underlying cough and we will let you know if an antiviral is indicated based on your test results tomorrow.  Fever control with ibuprofen and Tylenol, fluids, over-the-counter cold and congestion medications recommended for symptomatic benefit.  Return for any worsening symptoms.

## 2022-07-31 NOTE — ED Triage Notes (Signed)
Pt presents with c/o cough for past 3 weeks ago and now states she is having loose stools for past 2 days

## 2022-07-31 NOTE — ED Provider Notes (Signed)
[facility]    CSN: [id number] Arrival date & time: 07/31/22  1833      History   Chief Complaint No chief complaint on file.   HPI Tiffany Hanson is a 76 y.o. female.   Patient presenting today with 3-week history of ongoing hacking cough.  Now 2 days of chills, body aches, fever, scratchy throat, runny nose in addition to the cough.  Denies chest pain, shortness of breath, wheezing, abdominal pain, nausea, vomiting.  Took Tylenol last night, otherwise not trying anything for symptoms.  No past history of chronic pulmonary conditions.  No known sick contacts recently.    Past Medical History:  Diagnosis Date   Abnormal Pap smear    Breast cancer ([place]) 12/16/2012   Right breast   Diabetes mellitus without complication (HCC)    GERD (gastroesophageal reflux disease)    Hypercholesterolemia    Hypertension    Leukoplakia, vulva 4/85/4627   Lichen sclerosus 0/35/0093   Osteopenia    Personal history of chemotherapy    Right breast   Personal history of radiation therapy    Right breast   S/P radiation therapy    completed in April 2010   Superficial fungus infection of skin 04/27/2015   Vaginal Pap smear, abnormal    Vitiligo     Patient Active Problem List   Diagnosis Date Noted   Yeast infection 02/17/2020   Vaginal itching 02/17/2020   Vaginal discharge 02/17/2020   Superficial fungus infection of skin 04/27/2015   Obesity (BMI 81-82.9) 93/71/6967   Lichen sclerosus 89/38/1017   Vitiligo 04/23/2013   Diabetes ([place]) 04/23/2013   Hypertension 04/23/2013   Leukoplakia, 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   Breast cancer ([place]) 12/16/2012    Past Surgical History:  Procedure Laterality Date   BLADDER SUSPENSION     16 to 17 years ago   BREAST LUMPECTOMY  08/20/08   HYSTEROSCOPY     TUBAL LIGATION  1993    OB History     Gravida  3   Para  3   Term      Preterm      AB      Living  3      SAB      IAB      Ectopic      Multiple      Live  Births               Home Medications    Prior to Admission medications   Medication Sig Start Date End Date Taking? Authorizing Provider  azithromycin (ZITHROMAX) 250 MG tablet Take first 2 tablets together, then 1 every day until finished. 07/31/22  Yes Volney American, PA-C  promethazine-dextromethorphan (PROMETHAZINE-DM) 6.25-15 MG/5ML syrup Take 5 mLs by mouth 4 (four) times daily as needed. 07/31/22  Yes Volney American, PA-C  ACCU-CHEK FASTCLIX LANCETS MISC  04/21/14   [provider]  amLODipine (NORVASC) 5 MG tablet Take 5 mg by mouth daily.    [provider]  Cholecalciferol (VITAMIN D-3) 5000 UNITS TABS Take 1 tablet by mouth daily.    [provider]  clobetasol ointment (TEMOVATE) 0.05 % APPLY TOPICALLY 2 TO 3 TIMES WEEKLY 05/15/22   Derrek Monaco A, NP  doxycycline (VIBRAMYCIN) 100 MG capsule Take 1 capsule (100 mg total) by mouth 2 (two) times daily. 06/08/22   Jaynee Eagles, PA-C  fish oil-omega-3 fatty acids 1000 MG capsule Take 1,200 mg by mouth daily.    [provider]  glimepiride (AMARYL) 1 MG tablet Take 1 mg by mouth daily before breakfast.  12/05/12   [provider]  loratadine (CLARITIN) 10 MG tablet Take 10 mg by mouth daily.    [provider]  nystatin (MYCOSTATIN/NYSTOP) 100000 UNIT/GM POWD Use 2-3 x daily to affected area prn 04/27/15   Estill Dooms, NP  ONE TOUCH ULTRA TEST test strip  04/21/14   [provider]  pantoprazole (PROTONIX) 40 MG tablet Take 40 mg by mouth daily.    [provider]  predniSONE (DELTASONE) 20 MG tablet Take 2 tablets (40 mg total) by mouth daily with breakfast. 06/08/22   Jaynee Eagles, PA-C  promethazine-dextromethorphan (PROMETHAZINE-DM) 6.25-15 MG/5ML syrup Take 2.5 mLs by mouth 3 (three) times daily as needed for cough. 06/08/22   Jaynee Eagles, PA-C  simvastatin (ZOCOR) 20 MG tablet Take 20 mg by mouth every evening.    [provider]   tretinoin (RETIN-A) 0.025 % cream Apply topically at bedtime. Use pea sized amount to affected areas of nose 09/15/21 09/15/22  Ralene Bathe, MD  triamcinolone cream (KENALOG) 0.1 % Apply 1 application topically 2 (two) times daily. Apply for 2 weeks. May use on face 08/02/21   Melynda Ripple, MD    Family History Family History  Problem Relation Age of Onset   Heart attack Mother    Diabetes Mother    Cancer Maternal Aunt        breast   Cancer Paternal Aunt        breast   Stroke Father    Heart disease Father    Lupus Sister    Stroke Paternal Grandmother    Diabetes Brother    Other Brother        spinal meningitis   Stroke Brother    Sleep apnea Son    Hearing loss Son    Cancer Son        on kidney   Other Son        hip surgery    Social History Social History   Tobacco Use   Smoking status: Never   Smokeless tobacco: Never  Vaping Use   Vaping Use: Never used  Substance Use Topics   Alcohol use: No   Drug use: No     Allergies   Patient has no known allergies.   Review of Systems Review of Systems Per HPI  Physical Exam Triage Vital Signs ED Triage Vitals  Enc Vitals Group     BP 07/31/22 1932 113/75     Pulse Rate 07/31/22 1932 (!) 102     Resp 07/31/22 1932 20     Temp 07/31/22 1932 (!) 102.7 F (39.3 C)     Temp src --      SpO2 07/31/22 1932 96 %     Weight --      Height --      Head Circumference --      Peak Flow --      Pain Score 07/31/22 1931 0     Pain Loc --      Pain Edu? --      Excl. in [place]? --    No data found.  Updated Vital Signs BP 113/75   Pulse (!) 102   Temp (!) 102.7 F (39.3 C)   Resp 20   SpO2 96%   Visual Acuity Right Eye Distance:   Left Eye Distance:   Bilateral Distance:    Right Eye Near:  Left Eye Near:    Bilateral Near:     Physical Exam Vitals and nursing note reviewed.  Constitutional:      General: She is not in acute distress.    Appearance: Normal appearance. She is not  ill-appearing.  HENT:     Head: Atraumatic.     Right Ear: Tympanic membrane and external ear normal.     Left Ear: Tympanic membrane and external ear normal.     Nose: Rhinorrhea present.     Mouth/Throat:     Mouth: Mucous membranes are moist.     Pharynx: Posterior oropharyngeal erythema present.  Eyes:     Extraocular Movements: Extraocular movements intact.     Conjunctiva/sclera: Conjunctivae normal.  Cardiovascular:     Rate and Rhythm: Normal rate and regular rhythm.     Heart sounds: Normal heart sounds.  Pulmonary:     Effort: Pulmonary effort is normal. No respiratory distress.     Breath sounds: Normal breath sounds. No wheezing or rales.  Musculoskeletal:        General: Normal range of motion.     Cervical back: Normal range of motion and neck supple.  Skin:    General: Skin is warm and dry.  Neurological:     Mental Status: She is alert and oriented to person, place, and time.  Psychiatric:        Mood and Affect: Mood normal.        Thought Content: Thought content normal.      UC Treatments / Results  Labs (all labs ordered are listed, but only abnormal results are displayed) Labs Reviewed  RESP PANEL BY RT-PCR (FLU A&B, COVID) [id number]    EKG   Radiology No results found.  Procedures Procedures (including critical care time)  Medications Ordered in UC Medications  acetaminophen (TYLENOL) tablet 650 mg (has no administration in time range)    Initial Impression / Assessment and Plan / UC Course  I have reviewed the triage vital signs and the nursing notes.  Pertinent labs & imaging results that were available during my care of the patient were reviewed by me and considered in my medical decision making (see chart for details).     Febrile and tachycardic in triage, Tylenol given at this time.  She is overall very well-appearing, in good spirits and high energy during exam.  Her lungs are clear to auscultation bilaterally, oxygen saturation  96% on room air and her exam is mostly consistent with a viral upper respiratory infection.  Suspect a new viral infection the past 2 days since the fever started, respiratory panel pending for further evaluation of this, discussed fever control, Phenergan DM, supportive over-the-counter medications and home care additionally.  Regarding her 3-week history of cough, will start azithromycin to protect against pneumonia given duration and course but do not suspect this to be related to her fever at this time.  Return for worsening symptoms.  Final Clinical Impressions(s) / UC Diagnoses   Final diagnoses:  Viral URI with cough  Fever, unspecified  Subacute cough     Discharge Instructions      We have tested you for COVID and flu today and should have these results back tomorrow.  I suspect that your fever, body aches, scratchy throat are related to a new viral illness unrelated to your ongoing cough for 3 weeks.  I have sent over an antibiotic and cough syrup to help with the underlying cough and we will let you know  if an antiviral is indicated based on your test results tomorrow.  Fever control with ibuprofen and Tylenol, fluids, over-the-counter cold and congestion medications recommended for symptomatic benefit.  Return for any worsening symptoms.    ED Prescriptions     Medication Sig Dispense Auth. Provider   azithromycin (ZITHROMAX) 250 MG tablet Take first 2 tablets together, then 1 every day until finished. 6 tablet Volney American, [place]   promethazine-dextromethorphan (PROMETHAZINE-DM) 6.25-15 MG/5ML syrup Take 5 mLs by mouth 4 (four) times daily as needed. 100 mL Volney American, [place]      PDMP not reviewed this encounter.   Volney American, [place] 07/31/22 1947

## 2022-08-01 LAB — RESP PANEL BY RT-PCR (FLU A&B, COVID) ARPGX2
Influenza A by PCR: NEGATIVE
Influenza B by PCR: NEGATIVE
SARS Coronavirus 2 by RT PCR: NEGATIVE

## 2022-08-11 DIAGNOSIS — J329 Chronic sinusitis, unspecified: Secondary | ICD-10-CM | POA: Diagnosis not present

## 2022-08-11 DIAGNOSIS — I129 Hypertensive chronic kidney disease with stage 1 through stage 4 chronic kidney disease, or unspecified chronic kidney disease: Secondary | ICD-10-CM | POA: Diagnosis not present

## 2022-08-11 DIAGNOSIS — E1165 Type 2 diabetes mellitus with hyperglycemia: Secondary | ICD-10-CM | POA: Diagnosis not present

## 2022-08-11 DIAGNOSIS — E6609 Other obesity due to excess calories: Secondary | ICD-10-CM | POA: Diagnosis not present

## 2022-08-11 DIAGNOSIS — Z6833 Body mass index (BMI) 33.0-33.9, adult: Secondary | ICD-10-CM | POA: Diagnosis not present

## 2022-08-11 DIAGNOSIS — I10 Essential (primary) hypertension: Secondary | ICD-10-CM | POA: Diagnosis not present

## 2022-08-18 DIAGNOSIS — E6609 Other obesity due to excess calories: Secondary | ICD-10-CM | POA: Diagnosis not present

## 2022-08-18 DIAGNOSIS — E1165 Type 2 diabetes mellitus with hyperglycemia: Secondary | ICD-10-CM | POA: Diagnosis not present

## 2022-08-18 DIAGNOSIS — I10 Essential (primary) hypertension: Secondary | ICD-10-CM | POA: Diagnosis not present

## 2022-08-18 DIAGNOSIS — I129 Hypertensive chronic kidney disease with stage 1 through stage 4 chronic kidney disease, or unspecified chronic kidney disease: Secondary | ICD-10-CM | POA: Diagnosis not present

## 2022-08-18 DIAGNOSIS — Z6833 Body mass index (BMI) 33.0-33.9, adult: Secondary | ICD-10-CM | POA: Diagnosis not present

## 2022-09-18 ENCOUNTER — Ambulatory Visit: Payer: Medicare HMO | Admitting: Dermatology

## 2022-10-02 ENCOUNTER — Ambulatory Visit (INDEPENDENT_AMBULATORY_CARE_PROVIDER_SITE_OTHER): Payer: Medicare PPO | Admitting: Dermatology

## 2022-10-02 DIAGNOSIS — L700 Acne vulgaris: Secondary | ICD-10-CM | POA: Diagnosis not present

## 2022-10-02 DIAGNOSIS — L720 Epidermal cyst: Secondary | ICD-10-CM | POA: Diagnosis not present

## 2022-10-02 DIAGNOSIS — Z79899 Other long term (current) drug therapy: Secondary | ICD-10-CM | POA: Diagnosis not present

## 2022-10-02 DIAGNOSIS — L209 Atopic dermatitis, unspecified: Secondary | ICD-10-CM

## 2022-10-02 MED ORDER — MOMETASONE FUROATE 0.1 % EX CREA: TOPICAL_CREAM | CUTANEOUS | 1 refills | Status: DC

## 2022-10-02 MED ORDER — TRETINOIN 0.025 % EX CREA: TOPICAL_CREAM | CUTANEOUS | 11 refills | Status: DC

## 2022-10-02 NOTE — Progress Notes (Signed)
Follow-Up Visit   Subjective  Tiffany Hanson is a 76 y.o. female who presents for the following: Acne (Patient currently using Tretinoin 0.025% cream QHS and tolerating well. She needs a year refill on prescription). The patient has spots, moles and lesions to be evaluated, some may be new or changing.  The following portions of the chart were reviewed this encounter and updated as appropriate:   Tobacco  Allergies  Meds  Problems  Med Hx  Surg Hx  Fam Hx     Review of Systems:  No other skin or systemic complaints except as noted in HPI or Assessment and Plan.  Objective  Well appearing patient in no apparent distress; mood and affect are within normal limits.  A focused examination was performed including the face. Relevant physical exam findings are noted in the Assessment and Plan.  Face Minimal comedones.  R forehead, R neck 0.3 cm firm SQ nodules.   Assessment & Plan  Acne vulgaris Face Chronic and persistent condition with duration or expected duration over one year. Condition is symptomatic / bothersome to patient. Not to goal.  Continue Tretinoin 0.025% cream QHS. Topical retinoid medications like tretinoin/Retin-A, adapalene/Differin, tazarotene/Fabior, and Epiduo/Epiduo Forte can cause dryness and irritation when first started. Only apply a pea-sized amount to the entire affected area. Avoid applying it around the eyes, edges of mouth and creases at the nose. If you experience irritation, use a good moisturizer first and/or apply the medicine less often. If you are doing well with the medicine, you can increase how often you use it until you are applying every night. Be careful with sun protection while using this medication as it can make you sensitive to the sun. This medicine should not be used by pregnant women.   tretinoin (RETIN-A) 0.025 % cream - Face Apply a pea sized amount to the entire face QHS.  Atopic dermatitis,  Mid back  with hyperkeratosis -   Atopic dermatitis (eczema) is a chronic, relapsing, pruritic condition that can significantly affect quality of life. It is often associated with allergic rhinitis and/or asthma and can require treatment with topical medications, phototherapy, or in severe cases biologic injectable medication (Dupixent; Adbry) or Oral JAK inhibitors.  Start Mometasone 0.1% cream QD on M, W, F, Sat. Topical steroids (such as triamcinolone, fluocinolone, fluocinonide, mometasone, clobetasol, halobetasol, betamethasone, hydrocortisone) can cause thinning and lightening of the skin if they are used for too long in the same area. Your physician has selected the right strength medicine for your problem and area affected on the body. Please use your medication only as directed by your physician to prevent side effects.   Use CeraVe cream daily and if thick scaly areas remain recommend  Urea 40% cream PRN.   mometasone (ELOCON) 0.1 % cream - Mid Back Apply to rash on back QD on Monday, Wednesday, Friday, and Saturday PRN.  Epidermal inclusion cyst R forehead, R neck Benign-appearing. Exam most consistent with an epidermal inclusion cyst. Discussed that a cyst is a benign growth that can grow over time and sometimes get irritated or inflamed. Recommend observation if it is not bothersome. Discussed option of surgical excision to remove it if it is growing, symptomatic, or other changes noted. Please call for new or changing lesions so they can be evaluated.  Do not recommend excision at this time because it will leave a more noticeable scar.   Return in about 1 year (around 10/03/2023) for medication refill.  Luther Redo, CMA,  am acting as scribe for Sarina Ser, MD . Documentation: I have reviewed the above documentation for accuracy and completeness, and I agree with the above.  Sarina Ser, MD

## 2022-10-02 NOTE — Patient Instructions (Signed)
Due to recent changes in healthcare laws, you may see results of your pathology and/or laboratory studies on MyChart before the doctors have had a chance to review them. We understand that in some cases there may be results that are confusing or concerning to you. Please understand that not all results are received at the same time and often the doctors may need to interpret multiple results in order to provide you with the best plan of care or course of treatment. Therefore, we ask that you please give us 2 business days to thoroughly review all your results before contacting the office for clarification. Should we see a critical lab result, you will be contacted sooner.   If You Need Anything After Your Visit  If you have any questions or concerns for your doctor, please call our main line at [phone] and press option 4 to reach your doctor's medical assistant. If no one answers, please leave a voicemail as directed and we will return your call as soon as possible. Messages left after 4 pm will be answered the following business day.   You may also send us a message via MyChart. We typically respond to MyChart messages within 1-2 business days.  For prescription refills, please ask your pharmacy to contact our office. Our fax number is [phone].  If you have an urgent issue when the clinic is closed that cannot wait until the next business day, you can page your doctor at the number below.    Please note that while we do our best to be available for urgent issues outside of office hours, we are not available 24/7.   If you have an urgent issue and are unable to reach us, you may choose to seek medical care at your doctor's office, retail clinic, urgent care center, or emergency room.  If you have a medical emergency, please immediately call 911 or go to the emergency department.  Pager Numbers  - Dr. Kowalski: [phone]  - Dr. Moye: [phone]  - Dr. Stewart:  [phone]  In the event of inclement weather, please call our main line at [phone] for an update on the status of any delays or closures.  Dermatology Medication Tips: Please keep the boxes that topical medications come in in order to help keep track of the instructions about where and how to use these. Pharmacies typically print the medication instructions only on the boxes and not directly on the medication tubes.   If your medication is too expensive, please contact our office at [phone] option 4 or send us a message through MyChart.   We are unable to tell what your co-pay for medications will be in advance as this is different depending on your insurance coverage. However, we may be able to find a substitute medication at lower cost or fill out paperwork to get insurance to cover a needed medication.   If a prior authorization is required to get your medication covered by your insurance company, please allow us 1-2 business days to complete this process.  Drug prices often vary depending on where the prescription is filled and some pharmacies may offer cheaper prices.  The website www.goodrx.com contains coupons for medications through different pharmacies. The prices here do not account for what the cost may be with help from insurance (it may be cheaper with your insurance), but the website can give you the price if you did not use any insurance.  - You can print the associated coupon and take it with   your prescription to the pharmacy.  - You may also stop by our office during regular business hours and pick up a GoodRx coupon card.  - If you need your prescription sent electronically to a different pharmacy, notify our office through [facility] MyChart or by phone at [phone] option 4.     Si Usted Necesita Algo Despus de Su Visita  Tambin puede enviarnos un mensaje a travs de MyChart. Por lo general respondemos a los mensajes de MyChart en el transcurso de 1 a 2  das hbiles.  Para renovar recetas, por favor pida a su farmacia que se ponga en contacto con nuestra oficina. Nuestro nmero de fax es el [phone].  Si tiene un asunto urgente cuando la clnica est cerrada y que no puede esperar hasta el siguiente da hbil, puede llamar/localizar a su doctor(a) al nmero que aparece a continuacin.   Por favor, tenga en cuenta que aunque hacemos todo lo posible para estar disponibles para asuntos urgentes fuera del horario de oficina, no estamos disponibles las 24 horas del da, los 7 das de la semana.   Si tiene un problema urgente y no puede comunicarse con nosotros, puede optar por buscar atencin mdica  en el consultorio de su doctor(a), en una clnica privada, en un centro de atencin urgente o en una sala de emergencias.  Si tiene una emergencia mdica, por favor llame inmediatamente al 911 o vaya a la sala de emergencias.  Nmeros de bper  - Dr. Kowalski: [phone]  - Dra. Moye: [phone]  - Dra. Stewart: [phone]  En caso de inclemencias del tiempo, por favor llame a nuestra lnea principal al [phone] para una actualizacin sobre el estado de cualquier retraso o cierre.  Consejos para la medicacin en dermatologa: Por favor, guarde las cajas en las que vienen los medicamentos de uso tpico para ayudarle a seguir las instrucciones sobre dnde y cmo usarlos. Las farmacias generalmente imprimen las instrucciones del medicamento slo en las cajas y no directamente en los tubos del medicamento.   Si su medicamento es muy caro, por favor, pngase en contacto con nuestra oficina llamando al [phone] y presione la opcin 4 o envenos un mensaje a travs de MyChart.   No podemos decirle cul ser su copago por los medicamentos por adelantado ya que esto es diferente dependiendo de la cobertura de su seguro. Sin embargo, es posible que podamos encontrar un medicamento sustituto a menor costo o llenar un formulario para que el  seguro cubra el medicamento que se considera necesario.   Si se requiere una autorizacin previa para que su compaa de seguros cubra su medicamento, por favor permtanos de 1 a 2 das hbiles para completar este proceso.  Los precios de los medicamentos varan con frecuencia dependiendo del lugar de dnde se surte la receta y alguna farmacias pueden ofrecer precios ms baratos.  El sitio web www.goodrx.com tiene cupones para medicamentos de diferentes farmacias. Los precios aqu no tienen en cuenta lo que podra costar con la ayuda del seguro (puede ser ms barato con su seguro), pero el sitio web puede darle el precio si no utiliz ningn seguro.  - Puede imprimir el cupn correspondiente y llevarlo con su receta a la farmacia.  - Tambin puede pasar por nuestra oficina durante el horario de atencin regular y recoger una tarjeta de cupones de GoodRx.  - Si necesita que su receta se enve electrnicamente a una farmacia diferente, informe a nuestra oficina a travs de MyChart de [facility]   o por telfono llamando al [phone] y presione la opcin 4.  

## 2022-10-14 ENCOUNTER — Encounter: Payer: Self-pay | Admitting: Dermatology

## 2022-12-15 DIAGNOSIS — E7849 Other hyperlipidemia: Secondary | ICD-10-CM | POA: Diagnosis not present

## 2022-12-15 DIAGNOSIS — E1165 Type 2 diabetes mellitus with hyperglycemia: Secondary | ICD-10-CM | POA: Diagnosis not present

## 2022-12-15 DIAGNOSIS — I129 Hypertensive chronic kidney disease with stage 1 through stage 4 chronic kidney disease, or unspecified chronic kidney disease: Secondary | ICD-10-CM | POA: Diagnosis not present

## 2022-12-15 DIAGNOSIS — N183 Chronic kidney disease, stage 3 unspecified: Secondary | ICD-10-CM | POA: Diagnosis not present

## 2022-12-15 DIAGNOSIS — E6609 Other obesity due to excess calories: Secondary | ICD-10-CM | POA: Diagnosis not present

## 2022-12-15 DIAGNOSIS — Z1331 Encounter for screening for depression: Secondary | ICD-10-CM | POA: Diagnosis not present

## 2022-12-15 DIAGNOSIS — Z6832 Body mass index (BMI) 32.0-32.9, adult: Secondary | ICD-10-CM | POA: Diagnosis not present

## 2022-12-15 DIAGNOSIS — I10 Essential (primary) hypertension: Secondary | ICD-10-CM | POA: Diagnosis not present

## 2022-12-15 DIAGNOSIS — E1122 Type 2 diabetes mellitus with diabetic chronic kidney disease: Secondary | ICD-10-CM | POA: Diagnosis not present

## 2022-12-15 DIAGNOSIS — E782 Mixed hyperlipidemia: Secondary | ICD-10-CM | POA: Diagnosis not present

## 2022-12-15 DIAGNOSIS — Z0001 Encounter for general adult medical examination with abnormal findings: Secondary | ICD-10-CM | POA: Diagnosis not present

## 2022-12-19 ENCOUNTER — Other Ambulatory Visit (HOSPITAL_COMMUNITY): Payer: Self-pay | Admitting: Family Medicine

## 2022-12-19 DIAGNOSIS — Z1231 Encounter for screening mammogram for malignant neoplasm of breast: Secondary | ICD-10-CM

## 2022-12-22 DIAGNOSIS — H6503 Acute serous otitis media, bilateral: Secondary | ICD-10-CM | POA: Diagnosis not present

## 2022-12-22 DIAGNOSIS — R42 Dizziness and giddiness: Secondary | ICD-10-CM | POA: Diagnosis not present

## 2022-12-22 DIAGNOSIS — R030 Elevated blood-pressure reading, without diagnosis of hypertension: Secondary | ICD-10-CM | POA: Diagnosis not present

## 2023-01-04 DIAGNOSIS — H6981 Other specified disorders of Eustachian tube, right ear: Secondary | ICD-10-CM | POA: Diagnosis not present

## 2023-01-04 DIAGNOSIS — H8110 Benign paroxysmal vertigo, unspecified ear: Secondary | ICD-10-CM | POA: Diagnosis not present

## 2023-01-04 DIAGNOSIS — E6609 Other obesity due to excess calories: Secondary | ICD-10-CM | POA: Diagnosis not present

## 2023-01-04 DIAGNOSIS — Z6834 Body mass index (BMI) 34.0-34.9, adult: Secondary | ICD-10-CM | POA: Diagnosis not present

## 2023-01-19 ENCOUNTER — Ambulatory Visit (HOSPITAL_COMMUNITY): Payer: Medicare PPO

## 2023-02-07 ENCOUNTER — Encounter (HOSPITAL_COMMUNITY): Payer: Self-pay

## 2023-02-07 ENCOUNTER — Ambulatory Visit (HOSPITAL_COMMUNITY)
Admission: RE | Admit: 2023-02-07 | Discharge: 2023-02-07 | Disposition: A | Discharge status: Home / Self Care | Payer: PPO | Source: Ambulatory Visit | Attending: Family Medicine | Admitting: Family Medicine

## 2023-02-07 DIAGNOSIS — Z1231 Encounter for screening mammogram for malignant neoplasm of breast: Secondary | ICD-10-CM | POA: Diagnosis not present

## 2023-02-28 DIAGNOSIS — I10 Essential (primary) hypertension: Secondary | ICD-10-CM | POA: Diagnosis not present

## 2023-03-09 DIAGNOSIS — Z6833 Body mass index (BMI) 33.0-33.9, adult: Secondary | ICD-10-CM | POA: Diagnosis not present

## 2023-03-09 DIAGNOSIS — I10 Essential (primary) hypertension: Secondary | ICD-10-CM | POA: Diagnosis not present

## 2023-03-09 DIAGNOSIS — E6609 Other obesity due to excess calories: Secondary | ICD-10-CM | POA: Diagnosis not present

## 2023-03-09 DIAGNOSIS — N183 Chronic kidney disease, stage 3 unspecified: Secondary | ICD-10-CM | POA: Diagnosis not present

## 2023-03-09 DIAGNOSIS — E1165 Type 2 diabetes mellitus with hyperglycemia: Secondary | ICD-10-CM | POA: Diagnosis not present

## 2023-03-09 DIAGNOSIS — J302 Other seasonal allergic rhinitis: Secondary | ICD-10-CM | POA: Diagnosis not present

## 2023-04-07 ENCOUNTER — Ambulatory Visit (INDEPENDENT_AMBULATORY_CARE_PROVIDER_SITE_OTHER): Payer: 59

## 2023-04-07 ENCOUNTER — Encounter: Payer: Self-pay | Admitting: Emergency Medicine

## 2023-04-07 ENCOUNTER — Ambulatory Visit
Admission: EM | Admit: 2023-04-07 | Discharge: 2023-04-07 | Disposition: A | Discharge status: Home / Self Care | Payer: 59

## 2023-04-07 DIAGNOSIS — R059 Cough, unspecified: Secondary | ICD-10-CM | POA: Diagnosis not present

## 2023-04-07 MED ORDER — MONTELUKAST SODIUM 10 MG PO TABS: 10.0000 mg | ORAL_TABLET | Freq: Every day | ORAL | 0 refills | Status: AC

## 2023-04-07 MED ORDER — GUAIFENESIN 100 MG/5ML PO LIQD: 5.0000 mL | Freq: Four times a day (QID) | ORAL | 0 refills | Status: DC | PRN

## 2023-04-07 NOTE — ED Provider Notes (Signed)
[facility]    CSN: [id number] Arrival date & time: 04/07/23  4098      History   Chief Complaint No chief complaint on file.   HPI Tiffany Hanson is a 77 y.o. female.   The history is provided by the patient.   The patient presents for complaints of cough that been present for the past month.  Patient denies fever, chills, headache, sore throat, wheezing, shortness of breath, difficulty breathing, abdominal pain, nausea, vomiting, or diarrhea.  Patient reports a constant "tickle" in the back of her throat.  Patient states that her doctor recently had her on antibiotics approximately 2 weeks ago, but she did not notice any change in her symptoms.  She also states that recently, some of her medications were changed including lisinopril.  Patient states her cough started when she started the lisinopril.  Patient states her doctor took her off of the lisinopril for about 5 days, but there is no change in her cough.  She does have a history of seasonal allergies.  Reports she takes Zyrtec and uses Flonase daily.  Patient states that one of her family members took omeprazole, and she is interested in trying that to see if that helps her symptoms.  Reviewed patient's medications, and she is currently already taking pantoprazole.  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   Lichen sclerosus 05/09/2013   Osteopenia    Personal history of chemotherapy    Right breast   Personal history of radiation therapy    Right breast   S/P radiation therapy    completed in April 2010   Superficial fungus infection of skin 04/27/2015   Vaginal Pap smear, abnormal    Vitiligo     Patient Active Problem List   Diagnosis Date Noted   Yeast infection 02/17/2020    Vaginal itching 02/17/2020   Vaginal discharge 02/17/2020   Superficial fungus infection of skin 04/27/2015   Obesity (BMI 30-39.9) 11/24/2013   Lichen sclerosus 05/09/2013   Vitiligo 04/23/2013   Diabetes (HCC) 04/23/2013   Hypertension 04/23/2013   Leukoplakia, 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 Authorizing Provider  empagliflozin (JARDIANCE) 10 MG TABS tablet Take 10 mg by mouth daily.   Yes [provider]  guaiFENesin (ROBITUSSIN) 100 MG/5ML liquid Take 5 mLs by mouth every 6 (six) hours as needed for cough or to loosen phlegm. 04/07/23  Yes  Creedence Kunesh-Warren, Sadie Haber, NP  lisinopril (ZESTRIL) 10 MG tablet Take 10 mg by mouth daily.   Yes [provider]  montelukast (SINGULAIR) 10 MG tablet Take 1 tablet (10 mg total) by mouth at bedtime. 04/07/23  Yes Susi Goslin-Warren, Sadie Haber, NP  ACCU-CHEK FASTCLIX LANCETS MISC  04/21/14   [provider]  Cholecalciferol (VITAMIN D-3) 5000 UNITS TABS Take 1 tablet by mouth daily.    [provider]  mometasone (ELOCON) 0.1 % cream Apply to rash on back QD on Monday, Wednesday, Friday, and Saturday PRN. 10/02/22   Deirdre Evener, MD  ONE TOUCH ULTRA TEST test strip  04/21/14   [provider]  pantoprazole (PROTONIX) 40 MG tablet Take 40 mg by mouth daily.    [provider]  simvastatin (ZOCOR) 20 MG tablet Take 20 mg by mouth every evening.    [provider]  tretinoin (RETIN-A) 0.025 % cream Apply a pea sized amount to  the entire face QHS. 10/02/22   Deirdre Evener, MD    Family History Family History  Problem Relation Age of Onset   Heart attack Mother    Diabetes Mother    Cancer Maternal Aunt        breast   Cancer Paternal Aunt        breast   Stroke Father    Heart disease Father    Lupus Sister    Stroke Paternal Grandmother    Diabetes Brother    Other Brother        spinal meningitis   Stroke Brother    Sleep apnea Son    Hearing loss Son    Cancer Son        on kidney   Other Son        hip surgery    Social History Social History   Tobacco Use   Smoking status: Never   Smokeless tobacco: Never  Vaping Use   Vaping Use: Never used  Substance Use Topics   Alcohol use: No   Drug use: No     Allergies   Patient has no known allergies.   Review of Systems Review of Systems Per HPI  Physical Exam Triage Vital Signs ED Triage Vitals  Enc Vitals Group     BP 04/07/23 0903 130/84     Pulse Rate 04/07/23 0903 77     Resp 04/07/23 0903 18     Temp 04/07/23 0903 97.8 F (36.6 C)     Temp Source 04/07/23 0903 Oral     SpO2 04/07/23 0903 94 %     Weight --      Height --      Head Circumference --      Peak Flow --      Pain Score 04/07/23 0904 0     Pain Loc --      Pain Edu? --      Excl. in GC? --    No data found.  Updated Vital Signs BP 130/84 (BP Location: Right Arm)   Pulse 77   Temp 97.8 F (36.6 C) (Oral)   Resp 18   SpO2 94%   Visual Acuity Right Eye Distance:   Left Eye Distance:   Bilateral Distance:    Right Eye Near:   Left Eye Near:    Bilateral Near:     Physical Exam Vitals and nursing note reviewed.  Constitutional:      General: She is not in acute distress.  Appearance: Normal appearance.  HENT:     Head: Normocephalic.     Right Ear: Tympanic membrane, ear canal and external ear normal.     Left Ear: Tympanic membrane, ear canal and external ear normal.     Nose: Nose normal.     Mouth/Throat:     Mouth: Mucous  membranes are moist.     Pharynx: Posterior oropharyngeal erythema present.     Comments: Cobblestoning present to posterior oropharynx Eyes:     Extraocular Movements: Extraocular movements intact.     Conjunctiva/sclera: Conjunctivae normal.     Pupils: Pupils are equal, round, and reactive to light.  Cardiovascular:     Rate and Rhythm: Normal rate and regular rhythm.     Pulses: Normal pulses.     Heart sounds: Normal heart sounds.  Pulmonary:     Effort: Pulmonary effort is normal. No respiratory distress.     Breath sounds: Normal breath sounds. No stridor. No wheezing, rhonchi or rales.  Abdominal:     General: Bowel sounds are normal.     Palpations: Abdomen is soft.     Tenderness: There is no abdominal tenderness.  Musculoskeletal:     Cervical back: Normal range of motion.  Lymphadenopathy:     Cervical: No cervical adenopathy.  Skin:    General: Skin is warm and dry.  Neurological:     General: No focal deficit present.     Mental Status: She is alert and oriented to person, place, and time.  Psychiatric:        Mood and Affect: Mood normal.        Behavior: Behavior normal.      UC Treatments / Results  Labs (all labs ordered are listed, but only abnormal results are displayed) Labs Reviewed - No data to display  EKG   Radiology DG Chest 2 View  Result Date: 04/07/2023 CLINICAL DATA:  Cough for 1 month EXAM: CHEST - 2 VIEW COMPARISON:  12/24/2018 chest radiograph. FINDINGS: Stable cardiomediastinal silhouette with normal heart size. No pneumothorax. No pleural effusion. Lungs appear clear, with no acute consolidative airspace disease and no pulmonary edema. IMPRESSION: No active cardiopulmonary disease. Electronically Signed   By: Delbert Phenix M.D.   On: 04/07/2023 10:52    Procedures Procedures (including critical care time)  Medications Ordered in UC Medications - No data to display  Initial Impression / Assessment and Plan / UC Course  I have  reviewed the triage vital signs and the nursing notes.  Pertinent labs & imaging results that were available during my care of the patient were reviewed by me and considered in my medical decision making (see chart for details).  The patient is well-appearing, she is in no acute distress, vital signs are stable.  Chest x-ray was negative for acute cardiopulmonary disease.  On exam, patient's lung sounds are clear throughout, room air sats at 94%.  Patient is in no acute distress.  Difficult to ascertain the cause of the patient's cough, contributing factors include lisinopril she is currently taking, postnasal drip, and her history of reflux.  She is currently on medications for her allergies as well as pantoprazole for her reflux.  Symptoms did start when she started the lisinopril.  Patient was given prescriptions for guaifenesin 100 mg to help with her cough, along with Singulair 10 mg to help with her allergy symptoms to see if this will minimize her postnasal drainage.  Supportive care recommendations were provided and discussed with the patient  to include increasing her fluid intake, and use of a humidifier in her bedroom at nighttime during sleep.  Patient was advised to follow-up with her PCP within the next 7 to 10 days for reevaluation if symptoms do not improve.  Patient is in agreement with this plan of care and verbalizes understanding.  All questions were answered.  Patient stable for discharge.   Final Clinical Impressions(s) / UC Diagnoses   Final diagnoses:  Cough, unspecified type     Discharge Instructions      The chest x-ray was negative. Take medication as prescribed. Continue your current medications at this time. Recommend increasing your fluid intake.  Try to drink at least 6-8 8 ounce glasses of water daily. Recommend using a humidifier in your bedroom at nighttime during sleep and sleeping elevated on pillows while cough symptoms persist. I would like for you to  follow-up with your primary care physician within the next 7 to 10 days for reevaluation. Follow-up as needed.     ED Prescriptions     Medication Sig Dispense Auth. Provider   guaiFENesin (ROBITUSSIN) 100 MG/5ML liquid Take 5 mLs by mouth every 6 (six) hours as needed for cough or to loosen phlegm. 200 mL Tong Pieczynski-Warren, Sadie Haber, NP   montelukast (SINGULAIR) 10 MG tablet Take 1 tablet (10 mg total) by mouth at bedtime. 30 tablet Violet Cart-Warren, Sadie Haber, NP      PDMP not reviewed this encounter.   Abran Cantor, NP 04/07/23 1112

## 2023-04-07 NOTE — Discharge Instructions (Addendum)
The chest x-ray was negative. Take medication as prescribed. Continue your current medications at this time. Recommend increasing your fluid intake.  Try to drink at least 6-8 8 ounce glasses of water daily. Recommend using a humidifier in your bedroom at nighttime during sleep and sleeping elevated on pillows while cough symptoms persist. I would like for you to follow-up with your primary care physician within the next 7 to 10 days for reevaluation. Follow-up as needed.

## 2023-04-07 NOTE — ED Triage Notes (Signed)
Productive cough x 1 month.  Has seen PCP without relief.  States niece had same thing and was given a reflux pill which helped her.  States she wants the same thing.

## 2023-04-12 DIAGNOSIS — E6609 Other obesity due to excess calories: Secondary | ICD-10-CM | POA: Diagnosis not present

## 2023-04-12 DIAGNOSIS — E1122 Type 2 diabetes mellitus with diabetic chronic kidney disease: Secondary | ICD-10-CM | POA: Diagnosis not present

## 2023-04-12 DIAGNOSIS — K219 Gastro-esophageal reflux disease without esophagitis: Secondary | ICD-10-CM | POA: Diagnosis not present

## 2023-04-12 DIAGNOSIS — R053 Chronic cough: Secondary | ICD-10-CM | POA: Diagnosis not present

## 2023-04-12 DIAGNOSIS — J3089 Other allergic rhinitis: Secondary | ICD-10-CM | POA: Diagnosis not present

## 2023-04-12 DIAGNOSIS — Z6832 Body mass index (BMI) 32.0-32.9, adult: Secondary | ICD-10-CM | POA: Diagnosis not present

## 2023-06-20 ENCOUNTER — Ambulatory Visit: Payer: 59 | Admitting: Allergy & Immunology

## 2023-06-21 ENCOUNTER — Encounter (HOSPITAL_COMMUNITY): Payer: Self-pay | Admitting: Family Medicine

## 2023-06-21 DIAGNOSIS — N183 Chronic kidney disease, stage 3 unspecified: Secondary | ICD-10-CM | POA: Diagnosis not present

## 2023-06-21 DIAGNOSIS — R053 Chronic cough: Secondary | ICD-10-CM | POA: Diagnosis not present

## 2023-06-21 DIAGNOSIS — E6609 Other obesity due to excess calories: Secondary | ICD-10-CM | POA: Diagnosis not present

## 2023-06-21 DIAGNOSIS — K219 Gastro-esophageal reflux disease without esophagitis: Secondary | ICD-10-CM | POA: Diagnosis not present

## 2023-06-21 DIAGNOSIS — Z6831 Body mass index (BMI) 31.0-31.9, adult: Secondary | ICD-10-CM | POA: Diagnosis not present

## 2023-06-21 DIAGNOSIS — E1122 Type 2 diabetes mellitus with diabetic chronic kidney disease: Secondary | ICD-10-CM | POA: Diagnosis not present

## 2023-06-21 DIAGNOSIS — J3089 Other allergic rhinitis: Secondary | ICD-10-CM | POA: Diagnosis not present

## 2023-06-21 DIAGNOSIS — R634 Abnormal weight loss: Secondary | ICD-10-CM | POA: Diagnosis not present

## 2023-06-22 ENCOUNTER — Other Ambulatory Visit (HOSPITAL_COMMUNITY): Payer: Self-pay | Admitting: Family Medicine

## 2023-06-22 DIAGNOSIS — R634 Abnormal weight loss: Secondary | ICD-10-CM

## 2023-06-22 DIAGNOSIS — R053 Chronic cough: Secondary | ICD-10-CM

## 2023-09-21 DIAGNOSIS — I10 Essential (primary) hypertension: Secondary | ICD-10-CM | POA: Diagnosis not present

## 2023-09-21 DIAGNOSIS — K219 Gastro-esophageal reflux disease without esophagitis: Secondary | ICD-10-CM | POA: Diagnosis not present

## 2023-09-21 DIAGNOSIS — E1122 Type 2 diabetes mellitus with diabetic chronic kidney disease: Secondary | ICD-10-CM | POA: Diagnosis not present

## 2023-09-21 DIAGNOSIS — E6609 Other obesity due to excess calories: Secondary | ICD-10-CM | POA: Diagnosis not present

## 2023-09-21 DIAGNOSIS — E782 Mixed hyperlipidemia: Secondary | ICD-10-CM | POA: Diagnosis not present

## 2023-09-21 DIAGNOSIS — Z6832 Body mass index (BMI) 32.0-32.9, adult: Secondary | ICD-10-CM | POA: Diagnosis not present

## 2023-09-21 DIAGNOSIS — N183 Chronic kidney disease, stage 3 unspecified: Secondary | ICD-10-CM | POA: Diagnosis not present

## 2023-10-04 ENCOUNTER — Ambulatory Visit: Payer: 59 | Admitting: Dermatology

## 2023-10-11 DIAGNOSIS — N183 Chronic kidney disease, stage 3 unspecified: Secondary | ICD-10-CM | POA: Diagnosis not present

## 2023-10-11 DIAGNOSIS — E1122 Type 2 diabetes mellitus with diabetic chronic kidney disease: Secondary | ICD-10-CM | POA: Diagnosis not present

## 2023-11-12 ENCOUNTER — Ambulatory Visit
Admission: EM | Admit: 2023-11-12 | Discharge: 2023-11-12 | Disposition: A | Discharge status: Home / Self Care | Payer: PPO | Attending: Nurse Practitioner | Admitting: Nurse Practitioner

## 2023-11-12 DIAGNOSIS — J069 Acute upper respiratory infection, unspecified: Secondary | ICD-10-CM | POA: Diagnosis not present

## 2023-11-12 LAB — POC COVID19/FLU A&B COMBO
Covid Antigen, POC: NEGATIVE
Influenza A Antigen, POC: NEGATIVE
Influenza B Antigen, POC: NEGATIVE

## 2023-11-12 MED ORDER — FLUTICASONE PROPIONATE 50 MCG/ACT NA SUSP: 2.0000 | Freq: Every day | NASAL | 0 refills | Status: DC

## 2023-11-12 MED ORDER — PROMETHAZINE-DM 6.25-15 MG/5ML PO SYRP: 5.0000 mL | ORAL_SOLUTION | Freq: Four times a day (QID) | ORAL | 0 refills | Status: DC | PRN

## 2023-11-12 NOTE — ED Provider Notes (Signed)
 [facility]    CSN: [id number] Arrival date & time: 11/12/23  1015      History   Chief Complaint Chief Complaint  Patient presents with   Cough    HPI Tiffany Hanson is a 78 y.o. female.   The history is provided by the patient.   Patient presents with 3-day history of cough, nasal congestion, runny nose, sneezing, and headache.  Patient denies fever, chills, sore throat, ear pain, wheezing, difficulty breathing, chest pain, abdominal pain, nausea, vomiting, diarrhea, or rash.  Patient reports that her granddaughter was recently sick with the same or similar symptoms.  Patient reports she has been taking over-the-counter Tylenol  and Claritin for symptoms.  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   Lichen sclerosus 05/09/2013   Osteopenia    Personal history of chemotherapy    Right breast   Personal history of radiation therapy    Right breast   S/P radiation therapy    completed in April 2010   Superficial fungus infection of skin 04/27/2015   Vaginal Pap smear, abnormal    Vitiligo     Patient Active Problem List   Diagnosis Date Noted   Yeast infection 02/17/2020   Vaginal itching 02/17/2020   Vaginal discharge 02/17/2020   Superficial fungus infection of skin 04/27/2015   Obesity (BMI 30-39.9) 11/24/2013   Lichen sclerosus 05/09/2013   Vitiligo 04/23/2013   Diabetes (HCC) 04/23/2013   Hypertension 04/23/2013   Leukoplakia, 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   Breast cancer (HCC) 12/16/2012    Past Surgical History:  Procedure Laterality Date   BLADDER SUSPENSION     16 to 17 years ago   BREAST LUMPECTOMY  08/20/08   HYSTEROSCOPY     TUBAL LIGATION  1993    OB History     Gravida  3   Para  3   Term      Preterm       AB      Living  3      SAB      IAB      Ectopic      Multiple      Live Births               Home Medications    Prior to Admission medications   Medication Sig Start Date End Date Taking? Authorizing Provider  Cholecalciferol (VITAMIN D -3) 5000 UNITS TABS Take 1 tablet by mouth daily.   Yes [provider]  empagliflozin  (JARDIANCE ) 10 MG TABS tablet Take 10 mg by mouth daily.   Yes [provider]  fluticasone  (FLONASE ) 50 MCG/ACT nasal spray Place 2 sprays into both nostrils daily. 11/12/23  Yes Leath-Warren, Etta PARAS, NP  lisinopril  (ZESTRIL ) 10 MG tablet Take 10 mg by mouth daily.   Yes [provider]  montelukast  (SINGULAIR ) 10 MG tablet Take 1 tablet (10 mg total) by mouth at bedtime. 04/07/23  Yes Leath-Warren, Etta PARAS, NP  pantoprazole  (PROTONIX ) 40 MG tablet Take 40 mg by mouth daily.   Yes [provider]  promethazine -dextromethorphan (PROMETHAZINE -DM) 6.25-15 MG/5ML syrup Take 5 mLs by mouth 4 (four) times daily as needed. 11/12/23  Yes Leath-Warren, Etta PARAS, NP  simvastatin  (ZOCOR ) 20 MG tablet Take 20  mg by mouth every evening.   Yes [provider]  tretinoin  (RETIN-A ) 0.025 % cream Apply a pea sized amount to the entire face QHS. 10/02/22  Yes Hester Alm BROCKS, MD  ACCU-CHEK FASTCLIX LANCETS MISC  04/21/14   [provider]  guaiFENesin  (ROBITUSSIN) 100 MG/5ML liquid Take 5 mLs by mouth every 6 (six) hours as needed for cough or to loosen phlegm. 04/07/23   Leath-Warren, Etta PARAS, NP  mometasone  (ELOCON ) 0.1 % cream Apply to rash on back QD on Monday, Wednesday, Friday, and Saturday PRN. 10/02/22   Hester Alm BROCKS, MD  ONE TOUCH ULTRA TEST test strip  04/21/14   [provider]    Family History Family History  Problem Relation Age of Onset   Heart attack Mother    Diabetes Mother    Cancer Maternal Aunt        breast   Cancer Paternal Aunt        breast   Stroke Father    Heart  disease Father    Lupus Sister    Stroke Paternal Grandmother    Diabetes Brother    Other Brother        spinal meningitis   Stroke Brother    Sleep apnea Son    Hearing loss Son    Cancer Son        on kidney   Other Son        hip surgery    Social History Social History   Tobacco Use   Smoking status: Never   Smokeless tobacco: Never  Vaping Use   Vaping status: Never Used  Substance Use Topics   Alcohol use: No   Drug use: No     Allergies   Patient has no known allergies.   Review of Systems Review of Systems Per HPI  Physical Exam Triage Vital Signs ED Triage Vitals  Encounter Vitals Group     BP 11/12/23 1028 104/71     Systolic BP Percentile --      Diastolic BP Percentile --      Pulse Rate 11/12/23 1028 90     Resp 11/12/23 1028 18     Temp 11/12/23 1028 98.5 F (36.9 C)     Temp Source 11/12/23 1028 Oral     SpO2 11/12/23 1028 98 %     Weight --      Height --      Head Circumference --      Peak Flow --      Pain Score 11/12/23 1031 0     Pain Loc --      Pain Education --      Exclude from Growth Chart --    No data found.  Updated Vital Signs BP 104/71 (BP Location: Left Arm)   Pulse 90   Temp 98.5 F (36.9 C) (Oral)   Resp 18   SpO2 98%   Visual Acuity Right Eye Distance:   Left Eye Distance:   Bilateral Distance:    Right Eye Near:   Left Eye Near:    Bilateral Near:     Physical Exam Vitals and nursing note reviewed.  Constitutional:      General: She is not in acute distress.    Appearance: Normal appearance.  HENT:     Head: Normocephalic.     Right Ear: Tympanic membrane, ear canal and external ear normal.     Left Ear: Tympanic membrane, ear canal and external ear normal.  Nose: Congestion present.     Right Turbinates: Enlarged and swollen.     Left Turbinates: Enlarged and swollen.     Right Sinus: No maxillary sinus tenderness or frontal sinus tenderness.     Left Sinus: No maxillary sinus  tenderness or frontal sinus tenderness.     Mouth/Throat:     Lips: Pink.     Mouth: Mucous membranes are moist.     Pharynx: Uvula midline. Postnasal drip present. No pharyngeal swelling, oropharyngeal exudate, posterior oropharyngeal erythema or uvula swelling.     Comments: Cobblestoning present to posterior oropharynx  Eyes:     Extraocular Movements: Extraocular movements intact.     Conjunctiva/sclera: Conjunctivae normal.     Pupils: Pupils are equal, round, and reactive to light.  Cardiovascular:     Rate and Rhythm: Normal rate and regular rhythm.     Pulses: Normal pulses.     Heart sounds: Normal heart sounds.  Pulmonary:     Effort: Pulmonary effort is normal. No respiratory distress.     Breath sounds: Normal breath sounds. No stridor. No wheezing, rhonchi or rales.  Abdominal:     General: Bowel sounds are normal.     Palpations: Abdomen is soft.     Tenderness: There is no abdominal tenderness.  Musculoskeletal:     Cervical back: Normal range of motion.  Lymphadenopathy:     Cervical: No cervical adenopathy.  Skin:    General: Skin is warm and dry.  Neurological:     General: No focal deficit present.     Mental Status: She is alert and oriented to person, place, and time.  Psychiatric:        Mood and Affect: Mood normal.        Behavior: Behavior normal.      UC Treatments / Results  Labs (all labs ordered are listed, but only abnormal results are displayed) Labs Reviewed  POC COVID19/FLU A&B COMBO - Normal    EKG   Radiology No results found.  Procedures Procedures (including critical care time)  Medications Ordered in UC Medications - No data to display  Initial Impression / Assessment and Plan / UC Course  I have reviewed the triage vital signs and the nursing notes.  Pertinent labs & imaging results that were available during my care of the patient were reviewed by me and considered in my medical decision making (see chart for  details).  COVID/flu test was negative.  Suspect symptoms are most likely caused by viral URI with cough.  Will provide symptomatic treatment with Promethazine  DM for cough, and fluticasone  50 mcg nasal spray for the nasal congestion.  Supportive care recommendations were provided and discussed with the patient to include fluids, rest, over-the-counter Tylenol  for pain or discomfort, and use of a humidifier at nighttime during sleep.  Discussed indications with the patient regarding follow-up.  Patient was in agreement with this plan of care and verbalizes understanding.  All questions were answered.  Patient stable for discharge.  Final Clinical Impressions(s) / UC Diagnoses   Final diagnoses:  Viral URI with cough     Discharge Instructions      The COVID and flu test were negative. Take medication as prescribed.  Continue Claritin that you are currently taking.  If the cough medicine makes you drowsy, during the day, you may take over-the-counter Robitussin or Mucinex  as needed for the cough. Increase fluids and allow for plenty of rest. Recommend using humidifier in your bedroom at nighttime  during sleep and sleeping elevated on pillows while cough symptoms persist. Symptoms should improve over the next several days.  If you notice new symptoms such as fever, wheezing, difficulty breathing, or other concerns, you may follow-up in this clinic or with your primary care physician for further evaluation. Follow-up as needed.      ED Prescriptions     Medication Sig Dispense Auth. Provider   promethazine -dextromethorphan (PROMETHAZINE -DM) 6.25-15 MG/5ML syrup Take 5 mLs by mouth 4 (four) times daily as needed. 118 mL Leath-Warren, Etta PARAS, NP   fluticasone  (FLONASE ) 50 MCG/ACT nasal spray Place 2 sprays into both nostrils daily. 16 g Leath-Warren, Etta PARAS, NP      PDMP not reviewed this encounter.   Gilmer Etta PARAS, NP 11/12/23 1111

## 2023-11-12 NOTE — ED Triage Notes (Signed)
 Cough, runny nose, headache x 3 days. Taking tylenol and OTC allergy medication.

## 2023-11-12 NOTE — Discharge Instructions (Addendum)
 The COVID and flu test were negative. Take medication as prescribed.  Continue Claritin that you are currently taking.  If the cough medicine makes you drowsy, during the day, you may take over-the-counter Robitussin or Mucinex  as needed for the cough. Increase fluids and allow for plenty of rest. Recommend using humidifier in your bedroom at nighttime during sleep and sleeping elevated on pillows while cough symptoms persist. Symptoms should improve over the next several days.  If you notice new symptoms such as fever, wheezing, difficulty breathing, or other concerns, you may follow-up in this clinic or with your primary care physician for further evaluation. Follow-up as needed.

## 2023-12-27 DIAGNOSIS — K219 Gastro-esophageal reflux disease without esophagitis: Secondary | ICD-10-CM | POA: Diagnosis not present

## 2023-12-27 DIAGNOSIS — E782 Mixed hyperlipidemia: Secondary | ICD-10-CM | POA: Diagnosis not present

## 2023-12-27 DIAGNOSIS — E6609 Other obesity due to excess calories: Secondary | ICD-10-CM | POA: Diagnosis not present

## 2023-12-27 DIAGNOSIS — J3089 Other allergic rhinitis: Secondary | ICD-10-CM | POA: Diagnosis not present

## 2023-12-27 DIAGNOSIS — Z6832 Body mass index (BMI) 32.0-32.9, adult: Secondary | ICD-10-CM | POA: Diagnosis not present

## 2023-12-27 DIAGNOSIS — E1122 Type 2 diabetes mellitus with diabetic chronic kidney disease: Secondary | ICD-10-CM | POA: Diagnosis not present

## 2023-12-27 DIAGNOSIS — N183 Chronic kidney disease, stage 3 unspecified: Secondary | ICD-10-CM | POA: Diagnosis not present

## 2023-12-27 DIAGNOSIS — I10 Essential (primary) hypertension: Secondary | ICD-10-CM | POA: Diagnosis not present

## 2024-02-04 ENCOUNTER — Other Ambulatory Visit (HOSPITAL_COMMUNITY): Payer: Self-pay | Admitting: Family Medicine

## 2024-02-04 DIAGNOSIS — Z1231 Encounter for screening mammogram for malignant neoplasm of breast: Secondary | ICD-10-CM

## 2024-02-18 ENCOUNTER — Ambulatory Visit (HOSPITAL_COMMUNITY)

## 2024-02-20 ENCOUNTER — Inpatient Hospital Stay (HOSPITAL_COMMUNITY)
Admission: EM | Admit: 2024-02-20 | Discharge: 2024-02-24 | DRG: 522 | Disposition: A | Discharge status: Home Health | Attending: Family Medicine | Admitting: Family Medicine

## 2024-02-20 ENCOUNTER — Telehealth: Payer: Self-pay | Admitting: Orthopedic Surgery

## 2024-02-20 ENCOUNTER — Emergency Department (HOSPITAL_COMMUNITY)

## 2024-02-20 ENCOUNTER — Other Ambulatory Visit: Payer: Self-pay

## 2024-02-20 ENCOUNTER — Encounter (HOSPITAL_COMMUNITY): Payer: Self-pay

## 2024-02-20 DIAGNOSIS — Z803 Family history of malignant neoplasm of breast: Secondary | ICD-10-CM | POA: Diagnosis not present

## 2024-02-20 DIAGNOSIS — E669 Obesity, unspecified: Secondary | ICD-10-CM | POA: Diagnosis not present

## 2024-02-20 DIAGNOSIS — E119 Type 2 diabetes mellitus without complications: Secondary | ICD-10-CM | POA: Diagnosis not present

## 2024-02-20 DIAGNOSIS — L700 Acne vulgaris: Secondary | ICD-10-CM

## 2024-02-20 DIAGNOSIS — Z79899 Other long term (current) drug therapy: Secondary | ICD-10-CM | POA: Diagnosis not present

## 2024-02-20 DIAGNOSIS — Z833 Family history of diabetes mellitus: Secondary | ICD-10-CM

## 2024-02-20 DIAGNOSIS — S72002D Fracture of unspecified part of neck of left femur, subsequent encounter for closed fracture with routine healing: Secondary | ICD-10-CM | POA: Diagnosis not present

## 2024-02-20 DIAGNOSIS — Z923 Personal history of irradiation: Secondary | ICD-10-CM

## 2024-02-20 DIAGNOSIS — K219 Gastro-esophageal reflux disease without esophagitis: Secondary | ICD-10-CM | POA: Diagnosis present

## 2024-02-20 DIAGNOSIS — W19XXXA Unspecified fall, initial encounter: Secondary | ICD-10-CM | POA: Diagnosis present

## 2024-02-20 DIAGNOSIS — Z8249 Family history of ischemic heart disease and other diseases of the circulatory system: Secondary | ICD-10-CM | POA: Diagnosis not present

## 2024-02-20 DIAGNOSIS — Z7984 Long term (current) use of oral hypoglycemic drugs: Secondary | ICD-10-CM | POA: Diagnosis not present

## 2024-02-20 DIAGNOSIS — Z9221 Personal history of antineoplastic chemotherapy: Secondary | ICD-10-CM

## 2024-02-20 DIAGNOSIS — I10 Essential (primary) hypertension: Secondary | ICD-10-CM | POA: Diagnosis present

## 2024-02-20 DIAGNOSIS — M79605 Pain in left leg: Secondary | ICD-10-CM | POA: Diagnosis not present

## 2024-02-20 DIAGNOSIS — R531 Weakness: Secondary | ICD-10-CM | POA: Diagnosis not present

## 2024-02-20 DIAGNOSIS — E876 Hypokalemia: Secondary | ICD-10-CM | POA: Diagnosis present

## 2024-02-20 DIAGNOSIS — S79912D Unspecified injury of left hip, subsequent encounter: Secondary | ICD-10-CM | POA: Diagnosis not present

## 2024-02-20 DIAGNOSIS — Z794 Long term (current) use of insulin: Secondary | ICD-10-CM

## 2024-02-20 DIAGNOSIS — E782 Mixed hyperlipidemia: Secondary | ICD-10-CM | POA: Diagnosis present

## 2024-02-20 DIAGNOSIS — Z853 Personal history of malignant neoplasm of breast: Secondary | ICD-10-CM | POA: Diagnosis not present

## 2024-02-20 DIAGNOSIS — S72002A Fracture of unspecified part of neck of left femur, initial encounter for closed fracture: Principal | ICD-10-CM | POA: Diagnosis present

## 2024-02-20 DIAGNOSIS — Z471 Aftercare following joint replacement surgery: Secondary | ICD-10-CM | POA: Diagnosis not present

## 2024-02-20 DIAGNOSIS — S72012A Unspecified intracapsular fracture of left femur, initial encounter for closed fracture: Secondary | ICD-10-CM | POA: Diagnosis not present

## 2024-02-20 DIAGNOSIS — Z8269 Family history of other diseases of the musculoskeletal system and connective tissue: Secondary | ICD-10-CM

## 2024-02-20 DIAGNOSIS — M25452 Effusion, left hip: Secondary | ICD-10-CM | POA: Diagnosis not present

## 2024-02-20 DIAGNOSIS — Z96642 Presence of left artificial hip joint: Secondary | ICD-10-CM | POA: Diagnosis not present

## 2024-02-20 DIAGNOSIS — S299XXD Unspecified injury of thorax, subsequent encounter: Secondary | ICD-10-CM | POA: Diagnosis not present

## 2024-02-20 DIAGNOSIS — Z823 Family history of stroke: Secondary | ICD-10-CM | POA: Diagnosis not present

## 2024-02-20 DIAGNOSIS — M47816 Spondylosis without myelopathy or radiculopathy, lumbar region: Secondary | ICD-10-CM | POA: Diagnosis not present

## 2024-02-20 DIAGNOSIS — R609 Edema, unspecified: Secondary | ICD-10-CM | POA: Diagnosis not present

## 2024-02-20 LAB — BASIC METABOLIC PANEL WITH GFR
Anion gap: 12 (ref 5–15)
BUN: 12 mg/dL (ref 8–23)
CO2: 25 mmol/L (ref 22–32)
Calcium: 9.3 mg/dL (ref 8.9–10.3)
Chloride: 102 mmol/L (ref 98–111)
Creatinine, Ser: 0.87 mg/dL (ref 0.44–1.00)
GFR, Estimated: >60 mL/min (ref >60)
Glucose, Bld: 117 mg/dL — ABNORMAL HIGH (ref 70–99)
Potassium: 3.4 mmol/L — ABNORMAL LOW (ref 3.5–5.1)
Sodium: 139 mmol/L (ref 135–145)

## 2024-02-20 LAB — TYPE AND SCREEN
ABO/RH(D): AB POS
Antibody Screen: NEGATIVE

## 2024-02-20 LAB — CBC
HCT: 43.4 % (ref 36.0–46.0)
Hemoglobin: 14.3 g/dL (ref 12.0–15.0)
MCH: 29.2 pg (ref 26.0–34.0)
MCHC: 32.9 g/dL (ref 30.0–36.0)
MCV: 88.8 fL (ref 80.0–100.0)
Platelets: 413 10*3/uL — ABNORMAL HIGH (ref 150–400)
RBC: 4.89 MIL/uL (ref 3.87–5.11)
RDW: 13.3 % (ref 11.5–15.5)
WBC: 9 10*3/uL (ref 4.0–10.5)
nRBC: 0 % (ref 0.0–0.2)

## 2024-02-20 LAB — GLUCOSE, CAPILLARY: Glucose-Capillary: 164 mg/dL — ABNORMAL HIGH (ref 70–99)

## 2024-02-20 LAB — SEDIMENTATION RATE: Sed Rate: 14 mm/h (ref 0–22)

## 2024-02-20 LAB — BRAIN NATRIURETIC PEPTIDE: B Natriuretic Peptide: 18 pg/mL (ref 0.0–100.0)

## 2024-02-20 MED ORDER — EMPAGLIFLOZIN 10 MG PO TABS
10.0000 mg | ORAL_TABLET | Freq: Every day | ORAL | Status: DC
  Administered 2024-02-21 – 2024-02-24 (×4): 10 mg via ORAL
  Filled 2024-02-20 (×4): qty 1

## 2024-02-20 MED ORDER — SIMVASTATIN 20 MG PO TABS
20.0000 mg | ORAL_TABLET | Freq: Every evening | ORAL | Status: DC
  Administered 2024-02-21 – 2024-02-23 (×3): 20 mg via ORAL
  Filled 2024-02-20 (×3): qty 1

## 2024-02-20 MED ORDER — MORPHINE SULFATE (PF) 4 MG/ML IV SOLN: 3.0000 mg | Freq: Once | INTRAVENOUS | Status: DC

## 2024-02-20 MED ORDER — DOCUSATE SODIUM 100 MG PO CAPS
100.0000 mg | ORAL_CAPSULE | Freq: Two times a day (BID) | ORAL | Status: DC
  Administered 2024-02-20 – 2024-02-24 (×8): 100 mg via ORAL
  Filled 2024-02-20 (×8): qty 1

## 2024-02-20 MED ORDER — BISACODYL 10 MG RE SUPP: 10.0000 mg | Freq: Every day | RECTAL | Status: DC | PRN

## 2024-02-20 MED ORDER — INSULIN ASPART 100 UNIT/ML IJ SOLN
0.0000 [IU] | Freq: Three times a day (TID) | INTRAMUSCULAR | Status: DC
  Administered 2024-02-23 – 2024-02-24 (×2): 1 [IU] via SUBCUTANEOUS

## 2024-02-20 MED ORDER — ACETAMINOPHEN 325 MG PO TABS
650.0000 mg | ORAL_TABLET | Freq: Four times a day (QID) | ORAL | Status: DC | PRN
  Administered 2024-02-22: 650 mg via ORAL
  Filled 2024-02-20: qty 2

## 2024-02-20 MED ORDER — ONDANSETRON HCL 4 MG/2ML IJ SOLN: 4.0000 mg | Freq: Once | INTRAMUSCULAR | Status: DC

## 2024-02-20 MED ORDER — OXYCODONE HCL 5 MG PO TABS
5.0000 mg | ORAL_TABLET | ORAL | Status: DC | PRN
  Administered 2024-02-20: 5 mg via ORAL
  Administered 2024-02-21: 10 mg via ORAL
  Administered 2024-02-21: 5 mg via ORAL
  Administered 2024-02-21 – 2024-02-22 (×3): 10 mg via ORAL
  Filled 2024-02-20 (×2): qty 2
  Filled 2024-02-20: qty 1
  Filled 2024-02-20 (×3): qty 2

## 2024-02-20 MED ORDER — PANTOPRAZOLE SODIUM 40 MG PO TBEC
40.0000 mg | DELAYED_RELEASE_TABLET | Freq: Every day | ORAL | Status: DC
  Administered 2024-02-21 – 2024-02-24 (×4): 40 mg via ORAL
  Filled 2024-02-20 (×4): qty 1

## 2024-02-20 MED ORDER — HEPARIN SODIUM (PORCINE) 5000 UNIT/ML IJ SOLN: 5000.0000 [IU] | Freq: Three times a day (TID) | INTRAMUSCULAR | Status: DC

## 2024-02-20 MED ORDER — LISINOPRIL 10 MG PO TABS
10.0000 mg | ORAL_TABLET | Freq: Every day | ORAL | Status: DC
  Administered 2024-02-21: 10 mg via ORAL
  Filled 2024-02-20 (×2): qty 1

## 2024-02-20 MED ORDER — INSULIN ASPART 100 UNIT/ML IJ SOLN: 0.0000 [IU] | Freq: Every day | INTRAMUSCULAR | Status: DC

## 2024-02-20 MED ORDER — POLYETHYLENE GLYCOL 3350 17 G PO PACK: 17.0000 g | PACK | Freq: Every day | ORAL | Status: DC | PRN

## 2024-02-20 MED ORDER — HEPARIN SODIUM (PORCINE) 5000 UNIT/ML IJ SOLN
5000.0000 [IU] | Freq: Three times a day (TID) | INTRAMUSCULAR | Status: DC
  Administered 2024-02-20 – 2024-02-22 (×5): 5000 [IU] via SUBCUTANEOUS
  Filled 2024-02-20 (×5): qty 1

## 2024-02-20 MED ORDER — HYDROMORPHONE HCL 1 MG/ML IJ SOLN
0.5000 mg | INTRAMUSCULAR | Status: DC | PRN
  Administered 2024-02-21 – 2024-02-22 (×2): 0.5 mg via INTRAVENOUS
  Filled 2024-02-20 (×2): qty 0.5

## 2024-02-20 NOTE — Telephone Encounter (Signed)
 I called, and LMVM for a return call.  I do see EMS run sheet in chart, so patient is likely in process of being admitted.  She needs to be seen at the ED for this hip fx.

## 2024-02-20 NOTE — Assessment & Plan Note (Signed)
 Continue simvastatin.

## 2024-02-20 NOTE — Assessment & Plan Note (Addendum)
 Significant delay to presentation.  Orthopedics consulted.  It was relayed to me that there may be increased risk of infection or clot in this case.  However, no fever, leukocytosis or pain to suggest infection.  I will defer infectious evaluation until Dr. Phyllis Breeze has been able to evaluate the patient in person.  Also, no swelling or redness of the legs nor any clinical signs of DVT.   - Consult Orthopedics - Analgesics - Obtain US  duplex for DVT (if unable to obtain US  without external rotation of the left hip, do not obtain US )   From the perioperative risk standpoint, she has an RCRI score of 0.  She can exert to equivalents without symtoms.  She has no active cardiac symptoms.  She is average risk for hip repair and may proceed to surgery - Hold lisinopril  day of surgery - Continue statin

## 2024-02-20 NOTE — ED Provider Notes (Signed)
 [place] EMERGENCY DEPARTMENT AT [facility] Provider Note   CSN: [id number] Arrival date & time: 02/20/24  1247     History  Chief Complaint  Patient presents with   Joint Swelling    Tiffany Hanson is a 78 y.o. female.  HPI     Tiffany Hanson is a 78 y.o. female past medical history of type 2 diabetes, hypertension who presents to the Emergency Department complaining of bilateral lower extremity edema and left hip pain.  States that she fell 2 weeks ago injuring her left hip.  She is scheduled to see orthopedics soon regarding her hip pain.  She has been wearing a compression undergarment for her hip pain.  She has been wearing this for the last 2 days, she noticed swelling to her ankles today.  She denies any numbness weakness or skin changes to the extremities.  No chest pain or shortness of breath.   Home Medications Prior to Admission medications   Medication Sig Start Date End Date Taking? Authorizing Provider  ACCU-CHEK FASTCLIX LANCETS MISC  04/21/14   [provider]  Cholecalciferol (VITAMIN D -3) 5000 UNITS TABS Take 1 tablet by mouth daily.    [provider]  empagliflozin  (JARDIANCE ) 10 MG TABS tablet Take 10 mg by mouth daily.    [provider]  fluticasone  (FLONASE ) 50 MCG/ACT nasal spray Place 2 sprays into both nostrils daily. 11/12/23   Leath-Warren, Belen Bowers, NP  guaiFENesin  (ROBITUSSIN) 100 MG/5ML liquid Take 5 mLs by mouth every 6 (six) hours as needed for cough or to loosen phlegm. 04/07/23   Leath-Warren, Belen Bowers, NP  lisinopril  (ZESTRIL ) 10 MG tablet Take 10 mg by mouth daily.    [provider]  mometasone  (ELOCON ) 0.1 % cream Apply to rash on back QD on Monday, Wednesday, Friday, and Saturday PRN. 10/02/22   Elta Halter, MD  montelukast  (SINGULAIR ) 10 MG tablet Take 1 tablet (10 mg total) by mouth at bedtime. 04/07/23   Leath-Warren, Belen Bowers, NP  ONE TOUCH ULTRA TEST test strip  04/21/14    [provider]  pantoprazole  (PROTONIX ) 40 MG tablet Take 40 mg by mouth daily.    [provider]  promethazine -dextromethorphan (PROMETHAZINE -DM) 6.25-15 MG/5ML syrup Take 5 mLs by mouth 4 (four) times daily as needed. 11/12/23   Leath-Warren, Belen Bowers, NP  simvastatin  (ZOCOR ) 20 MG tablet Take 20 mg by mouth every evening.    [provider]  tretinoin  (RETIN-A ) 0.025 % cream Apply a pea sized amount to the entire face QHS. 10/02/22   Elta Halter, MD      Allergies    Patient has no known allergies.    Review of Systems   Review of Systems  Constitutional:  Negative for chills and fever.  Respiratory:  Negative for shortness of breath.   Cardiovascular:  Negative for chest pain.  Gastrointestinal:  Negative for abdominal pain, nausea and vomiting.  Musculoskeletal:  Positive for arthralgias (left hip pain).       Swelling bilateral ankles  Skin:  Negative for color change, rash and wound.  Neurological:  Negative for dizziness, weakness, numbness and headaches.    Physical Exam Updated Vital Signs BP (!) 143/81 (BP Location: Right Arm)   Pulse 83   Temp 98.4 F (36.9 C) (Oral)   Resp 16   Ht 5\' 5"  (1.651 m)   Wt 84.4 kg   SpO2 99%   BMI 30.96 kg/m  Physical Exam Vitals and  nursing note reviewed.  Constitutional:      General: She is not in acute distress.    Appearance: Normal appearance. She is not ill-appearing.  Cardiovascular:     Rate and Rhythm: Normal rate and regular rhythm.     Pulses: Normal pulses.  Pulmonary:     Effort: Pulmonary effort is normal.     Breath sounds: Normal breath sounds.  Abdominal:     Palpations: Abdomen is soft.     Tenderness: There is no abdominal tenderness.  Musculoskeletal:        General: Tenderness and signs of injury present.     Comments: Mild edema to bilateral ankles, no erythema or excessive warmth of the LE's.  No open wounds.  Pain with ROM left hip.    Skin:    General: Skin is  warm.     Capillary Refill: Capillary refill takes less than 2 seconds.     Findings: No bruising, erythema or rash.  Neurological:     General: No focal deficit present.     Mental Status: She is alert.     Sensory: No sensory deficit.     Motor: No weakness.    ED Results / Procedures / Treatments   Labs (all labs ordered are listed, but only abnormal results are displayed) Labs Reviewed  CBC - Abnormal; Notable for the following components:      Result Value   Platelets 413 (*)    All other components within normal limits  BASIC METABOLIC PANEL WITH GFR - Abnormal; Notable for the following components:   Potassium 3.4 (*)    Glucose, Bld 117 (*)    All other components within normal limits  BRAIN NATRIURETIC PEPTIDE  SEDIMENTATION RATE  C-REACTIVE PROTEIN    EKG None  Radiology DG Chest 2 View Result Date: 02/20/2024 CLINICAL DATA:  fall 2 weeks ago EXAM: CHEST - 2 VIEW COMPARISON:  Chest x-ray 04/07/2023. CT chest 09/07/2008 FINDINGS: The heart and mediastinal contours are within normal limits. No focal consolidation. No pulmonary edema. No pleural effusion. No pneumothorax. No acute osseous abnormality. Multilevel degenerative change of the spine. Chronic appearing midthoracic vertebral body height loss. IMPRESSION: No active cardiopulmonary disease. Electronically Signed   By: Morgane  Naveau M.D.   On: 02/20/2024 17:37   DG Hip Unilat W or Wo Pelvis 2-3 Views Left Result Date: 02/20/2024 CLINICAL DATA:  fall 2 weeks ago EXAM: DG HIP (WITH OR WITHOUT PELVIS) 2-3V LEFT COMPARISON:  None Available. FINDINGS: Acute superiorly displaced subcapital left femoral neck fracture. No left hip dislocation. No acute displaced fracture or dislocation of the right hip on frontal view. No acute displaced fracture or diastasis of the bones of the pelvis. There is no evidence of severe arthropathy or aggressive appearing focal bone abnormality of the bones of the pelvis or hips. Anchor suture  devices noted along the pubic symphysis bilaterally. Degenerative changes of the visualized lower lumbar spine. IMPRESSION: Acute superiorly displaced subcapital left femoral neck fracture. Electronically Signed   By: Morgane  Naveau M.D.   On: 02/20/2024 17:36    Procedures Procedures    Medications Ordered in ED Medications - No data to display  ED Course/ Medical Decision Making/ A&P                                 Medical Decision Making Patient having left hip pain status post mechanical fall 2 weeks ago.  She has been wearing a compression undergarment for 2 days the garment extends to the mid calf area.  She noticed swelling of her bilateral ankles today.  No numbness or weakness of the extremities.  No decrease sensation.  Chest pain or shortness of breath no history of CHF  I suspect the peripheral edema is secondary to the current compression undergarment that she is wearing as her reported edema is just distal to where the compression garment stops.  Extremities are neurovascularly intact.  There is no calf tenderness or redness of the extremities.  Patient has significant pain to left hip with movement and weightbearing.  Concern for fracture, dislocation musculoskeletal injury all considered.  new onset CHF, DVT also considered but felt less likely.  Amount and/or Complexity of Data Reviewed Labs: ordered.    Details: Labs unremarkable.  Sed rate and CRP pending Radiology: ordered.    Details: Chest x-ray without acute cardiopulmonary process  X-ray of the left hip shows displaced subcapital femoral neck fracture ECG/medicine tests: ordered.    Details: EKG shows sinus rhythm Discussion of management or test interpretation with external provider(s):  Discussed findings with orthopedics, Dr. Phyllis Breeze. Recommends hospital admission, US  in am and plan for surgery on Friday and start heparin  tonight and pt sent to [facility] tomorrow for hip aspiration  Discussed with [facility]  hospitalist, Dr. Darlyn Eke who agrees to admit   Risk Prescription drug management. Decision regarding hospitalization.           Final Clinical Impression(s) / ED Diagnoses Final diagnoses:  Closed fracture of neck of left femur, initial encounter [facility])    Rx / DC Orders ED Discharge Orders     None         Mitzie Anda 02/20/24 2303    Cheyenne Cotta, MD 02/22/24 1230

## 2024-02-20 NOTE — H&P (Signed)
 History and Physical    Patient: Tiffany Hanson MVH:[id number] DOB: Jun 16, 1946 DOA: 02/20/2024 DOS: the patient was seen and examined on 02/20/2024 PCP: Zella Hidalgo, PA-C  Patient coming from: Home  Chief Complaint:  Chief Complaint  Patient presents with   Joint Swelling       HPI:  78 yo F with obesity, DM, HTN who presented with inability to walk and left hip discomfort.  [name] 2 weeks ago, at the airport boarding the shuttle from the longterm parking lot.  Proceeded on her vacation to [place], but was limited by leg weakness and confined to her room at the resort (only using a wheelchair to come down for meals).  On returning, she went to a chiropractor 1 week PTA, had an x-ray.  2 days ago, the x-ray was reportedly read, and she believes they told her to call an orthopedist for an appointment.  Today, because of the delay for the appointment, she came to the ER.    In the ER, radiograph showed displaced subcapital hip fracture. Case discussed with Dr. Phyllis Breeze, Orthopedics.      Review of Systems  Constitutional:  Negative for chills, fever and malaise/fatigue.  Musculoskeletal:  Positive for falls and joint pain.  Neurological:  Positive for focal weakness.  All other systems reviewed and are negative.    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 She has never used smokeless tobacco. She reports that she does not drink alcohol and does not use drugs.  No Known Allergies  Family History  Problem Relation Age of Onset   Heart attack Mother    Diabetes Mother    Cancer Maternal Aunt        breast   Cancer Paternal Aunt        breast   Stroke Father    Heart disease Father    Lupus Sister    Stroke Paternal Grandmother    Diabetes Brother    Other Brother        spinal meningitis   Stroke Brother    Sleep apnea Son    Hearing loss Son    Cancer Son        on kidney   Other Son        hip surgery    Prior to Admission medications   Medication Sig Start Date End Date Taking? Authorizing Provider  ACCU-CHEK FASTCLIX LANCETS MISC  04/21/14   [provider]  Cholecalciferol (VITAMIN D -3) 5000 UNITS TABS Take 1 tablet by mouth daily.    [provider]  empagliflozin  (JARDIANCE ) 10  MG TABS tablet Take 10 mg by mouth daily.    [provider]  fluticasone  (FLONASE ) 50 MCG/ACT nasal spray Place 2 sprays into both nostrils daily. 11/12/23   Leath-Warren, Belen [place], NP  guaiFENesin  (ROBITUSSIN) 100 MG/5ML liquid Take 5 mLs by mouth every 6 (six) hours as needed for cough or to loosen phlegm. 04/07/23   Leath-Warren, Belen [place], NP  lisinopril  (ZESTRIL ) 10 MG tablet Take 10 mg by mouth daily.    [provider]  mometasone  (ELOCON ) 0.1 % cream Apply to rash on back QD on Monday, Wednesday, Friday, and Saturday PRN. 10/02/22   Elta Halter, MD  montelukast  (SINGULAIR ) 10 MG tablet Take 1 tablet (10 mg total) by mouth at bedtime. 04/07/23   Leath-Warren, Belen [place], NP  ONE TOUCH ULTRA TEST test strip  04/21/14   [provider]  pantoprazole  (PROTONIX ) 40 MG tablet Take 40 mg by mouth daily.    [provider]  promethazine -dextromethorphan  (PROMETHAZINE -DM) 6.25-15 MG/5ML syrup Take 5 mLs by mouth 4 (four) times daily as needed. 11/12/23   Leath-Warren, Belen Bowers, NP  simvastatin  (ZOCOR ) 20 MG tablet Take 20 mg by mouth every evening.    [provider]  tretinoin  (RETIN-A ) 0.025 % cream Apply a pea sized amount to the entire face QHS. 10/02/22   Elta Halter, MD    Physical Exam: Vitals:   02/20/24 1300 02/20/24 1301 02/20/24 1945 02/20/24 2036  BP: (!) 143/81  (!) 152/77 (!) 161/99  Pulse: 83  66 79  Resp: 16  16 17   Temp: 98.4 F (36.9 C)  98.4 F (36.9 C) 98.6 F (37 C)  TempSrc: Oral  Oral Oral  SpO2: 99%  97% 98%  Weight:  84.4 kg  76.9 kg  Height:  5\' 5"  (1.651 m)  5\' 5"  (1.651 m)   General appearance: Adult female alert and in no distress.  Responds appropriately to questions.  Eye contact, dress and hygiene appropriate. HEENT:  Anicteric, conjunctivae and sclerae normal without injection or icterus, lids and lashes normal.  Visual tracking smooth.  OP moist without lesions, dentition normal, lips normal, normal auditory acuity   Cor:  RRR, without murmurs, rubs.  JVP normal, no LE edema Resp:  Normal respiratory rate and rhythm.  CTAB without rales or wheezes. Abd:  No TTP or rebound all quadrants.  No masses or organomegaly.   No ascites, distension.   MSK: The right hip is in external rotation, but the left hip she is holding straight.  She moves the left ankle and foot and has normal sensation in the left foot.  She has weakness of hip flexion on the left.   Skin:  cap refill normal, Skin intact without significant rashes or lesions. Neuro:  Speech is fluent.  Naming is grossly intact, patient's recall, both recent and remote, seem within normal limits.  Muscle tone normal in the upper extremities, face symmetric  Psych:  Attention span and concentration are within normal limits.  Affect normal.  appropriate thought content and normal rate of speech, thought process linear           Data  Reviewed: CBC unremarkable Basic metabolic panel shows mild hypokalemia only, normal renal function Sed rate and CRP are pending X-ray of the pelvis shows no acute displaced subcapital femoral neck fracture on the left Chest x-ray, personally reviewed, shows no airspace disease or opacity EKG, personally reviewed, shows normal sinus rhythm, no ST changes    Assessment and  Plan: * Closed left hip fracture (HCC) Significant delay to presentation.  Orthopedics consulted.  It was relayed to me that there may be increased risk of infection or clot in this case.  However, no fever, leukocytosis or pain to suggest infection.  I will defer infectious evaluation until Dr. Phyllis Breeze has been able to evaluate the patient in person.  Also, no swelling or redness of the legs nor any clinical signs of DVT.   - Consult Orthopedics - Analgesics - Obtain US  duplex for DVT (if unable to obtain US  without external rotation of the left hip, do not obtain US )   From the perioperative risk standpoint, she has an RCRI score of 0.  She can exert to equivalents without symtoms.  She has no active cardiac symptoms.  She is average risk for hip repair and may proceed to surgery - Hold lisinopril  day of surgery - Continue statin     Mixed hyperlipidemia - Continue simvastatin   Hypokalemia - Check Mag - Supplement K  Hypertension BP normal - Continue lisinpril  Diabetes (HCC) Glucose normal here - Check A1c in AM - SS corrections - Continue home Jardiance          Advance Care Planning: Full code, discussed with patient  Consults: Orthopedics, Dr. Phyllis Breeze  Family Communication: None present  Severity of Illness: The appropriate patient status for this patient is INPATIENT. Inpatient status is judged to be reasonable and necessary in order to provide the required intensity of service to ensure the patient's safety. The patient's presenting symptoms, physical exam findings, and initial  radiographic and laboratory data in the context of their chronic comorbidities is felt to place them at high risk for further clinical deterioration. Furthermore, it is not anticipated that the patient will be medically stable for discharge from the hospital within 2 midnights of admission.   * I certify that at the point of admission it is my clinical judgment that the patient will require inpatient hospital care spanning beyond 2 midnights from the point of admission due to high intensity of service, high risk for further deterioration and high frequency of surveillance required.*  Author: Ephriam Hashimoto, MD 02/20/2024 9:18 PM  For on call review [email or web address].

## 2024-02-20 NOTE — Assessment & Plan Note (Signed)
 Glucose normal here - Check A1c in AM - SS corrections - Continue home Jardiance 

## 2024-02-20 NOTE — Assessment & Plan Note (Signed)
 BP normal - Continue lisinpril

## 2024-02-20 NOTE — ED Triage Notes (Signed)
 Pt arrived via [facility] from home c/o right foot swelling that began this morning. Pt has upcoming appointment with Ortho to iscuss left hip surgery as well. Pt ambulates with a walker at home. Pt denies injury.

## 2024-02-20 NOTE — Assessment & Plan Note (Addendum)
 Supplemented

## 2024-02-20 NOTE — Hospital Course (Addendum)
 78 yo F with obesity, DM, HTN who presented with inability to walk and left hip discomfort.  Pt reportedly fell 2 weeks ago, at the airport boarding the shuttle from the longterm parking lot.  Proceeded on her vacation to [place], but was limited by leg weakness and confined to her room at the resort (only using a wheelchair to come down for meals).  On returning, she went to a chiropractor 1 week PTA, had an x-ray.  2 days ago, the x-ray was reportedly read, and she believes they told her to call an orthopedist for an appointment.  Because of the delay for the appointment, she came to the ER.    In the ER, radiograph showed displaced subcapital hip fracture. Case discussed with Dr. Phyllis Breeze, Orthopedics and was taken to OR for ORIF on 02/22/24.

## 2024-02-20 NOTE — TOC Initial Note (Signed)
 Transition of Care [facility]) - Initial/Assessment Note    Patient Details  Name: Tiffany Hanson MRN: [id number] Date of Birth: Jun 09, 1946  Transition of [facility]) CM/SW Contact:    Geraldina Klinefelter, RN Phone Number: 02/20/2024, 9:54 PM  Clinical Narrative:            Low readmission risk. From home, independent, has a walker. If HHPT is needed, pt has no preferred agency.   Expected Discharge Plan: Home w Home Health Services Barriers to Discharge: Continued Medical Work up   Patient Goals and CMS Choice Patient states their goals for this hospitalization and ongoing recovery are:: To return home. CMS Medicare.gov Compare Post Acute Care list provided to:: Patient Choice offered to / list presented to : Patient  ownership interest in [facility].provided to:: Patient    Expected Discharge Plan and Services In-house Referral: Clinical Social Work Discharge Planning Services: CM Consult   Living arrangements for the past 2 months: Single Family Home             Prior Living Arrangements/Services Living arrangements for the past 2 months: Single Family Home   Patient language and need for interpreter reviewed:: Yes Do you feel safe going back to the place where you live?: Yes      Need for Family Participation in Patient Care: Yes (Comment) Care giver support system in place?: Yes (comment)   Criminal Activity/Legal Involvement Pertinent to Current Situation/Hospitalization: No - Comment as needed  Activities of Daily Living   ADL Screening (condition at time of admission) Independently performs ADLs?: Yes (appropriate for developmental age) Is the patient deaf or have difficulty hearing?: No Does the patient have difficulty seeing, even when wearing glasses/contacts?: No Does the patient have difficulty concentrating, remembering, or making decisions?: No  Permission Sought/Granted   Permission granted to share information with : Yes, Verbal Permission  Granted     Permission granted to share info w AGENCY: Any HH agency who accepts her insurance, if needed.    Emotional Assessment Appearance:: Well-Groomed Attitude/Demeanor/Rapport: Engaged Affect (typically observed): Appropriate Orientation: : Oriented to Self, Oriented to Place, Oriented to  Time, Oriented to Situation Alcohol / Substance Use: Not Applicable Psych Involvement: No (comment)  Admission diagnosis:  Closed left hip fracture (HCC) [S72.002A] Closed fracture of neck of left femur, initial encounter [facility]) [S72.002A] Patient Active Problem List   Diagnosis Date Noted   Hypokalemia 02/20/2024   Closed left hip fracture (HCC) 02/20/2024   Mixed hyperlipidemia 02/20/2024   Yeast infection 02/17/2020   Vaginal itching 02/17/2020   Vaginal discharge 02/17/2020   Superficial fungus infection of skin 04/27/2015   Lichen sclerosus 05/09/2013   Vitiligo 04/23/2013   Diabetes (HCC) 04/23/2013   Hypertension 04/23/2013   Leukoplakia, 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 [place] [address] Phone: [phone] Fax: [phone]  Social Drivers of Health (SDOH) Social History: SDOH Screenings   Food Insecurity: No Food Insecurity (02/20/2024)  Housing: Low Risk  (02/20/2024)  Transportation Needs: No Transportation Needs (02/20/2024)  Utilities: Not At Risk (02/20/2024)  Alcohol Screen: Low Risk  (02/17/2020)  Depression (PHQ2-9): Low Risk  (02/17/2020)  Financial Resource Strain: Low Risk  (02/17/2020)  Physical Activity: Insufficiently Active  (02/17/2020)  Social Connections: Moderately Integrated (02/20/2024)  Stress: No Stress Concern Present (02/17/2020)  Tobacco Use: Low Risk  (02/20/2024)   SDOH Interventions:  Readmission Risk Interventions    02/20/2024    9:31 PM  Readmission Risk Prevention Plan  Post Dischage Appt Complete  Medication Screening Complete  Transportation Screening Complete

## 2024-02-20 NOTE — Telephone Encounter (Signed)
 Tiffany Hanson - Spoke w/I believe the granddaughter, she called upset bc the pt is not being seen until Monday.  She wanted to know if they take her to the ED if they'll put a screw in her hip.  I told her if she goes to the ED that they will evaluate her and if they need to they will consult with the Ortho on call.  They patient also called yesterday requesting pain meds, I advised that our provider will not prescribe anything until she has established care with us .

## 2024-02-21 ENCOUNTER — Inpatient Hospital Stay (HOSPITAL_COMMUNITY)

## 2024-02-21 DIAGNOSIS — S72002A Fracture of unspecified part of neck of left femur, initial encounter for closed fracture: Secondary | ICD-10-CM | POA: Diagnosis not present

## 2024-02-21 LAB — CBC
HCT: 41.8 % (ref 36.0–46.0)
Hemoglobin: 13.3 g/dL (ref 12.0–15.0)
MCH: 28.7 pg (ref 26.0–34.0)
MCHC: 31.8 g/dL (ref 30.0–36.0)
MCV: 90.3 fL (ref 80.0–100.0)
Platelets: 373 10*3/uL (ref 150–400)
RBC: 4.63 MIL/uL (ref 3.87–5.11)
RDW: 13.2 % (ref 11.5–15.5)
WBC: 9.4 10*3/uL (ref 4.0–10.5)
nRBC: 0 % (ref 0.0–0.2)

## 2024-02-21 LAB — MAGNESIUM: Magnesium: 2.2 mg/dL (ref 1.7–2.4)

## 2024-02-21 LAB — GLUCOSE, CAPILLARY
Glucose-Capillary: 103 mg/dL — ABNORMAL HIGH (ref 70–99)
Glucose-Capillary: 117 mg/dL — ABNORMAL HIGH (ref 70–99)
Glucose-Capillary: 100 mg/dL — ABNORMAL HIGH (ref 70–99)
Glucose-Capillary: 120 mg/dL — ABNORMAL HIGH (ref 70–99)

## 2024-02-21 LAB — BASIC METABOLIC PANEL WITH GFR
Anion gap: 11 (ref 5–15)
BUN: 12 mg/dL (ref 8–23)
CO2: 25 mmol/L (ref 22–32)
Calcium: 8.9 mg/dL (ref 8.9–10.3)
Chloride: 104 mmol/L (ref 98–111)
Creatinine, Ser: 0.79 mg/dL (ref 0.44–1.00)
GFR, Estimated: >60 mL/min (ref >60)
Glucose, Bld: 105 mg/dL — ABNORMAL HIGH (ref 70–99)
Potassium: 3.2 mmol/L — ABNORMAL LOW (ref 3.5–5.1)
Sodium: 140 mmol/L (ref 135–145)

## 2024-02-21 LAB — HEMOGLOBIN A1C
Hgb A1c MFr Bld: 6.9 % — ABNORMAL HIGH (ref 4.8–5.6)
Mean Plasma Glucose: 151.33 mg/dL

## 2024-02-21 LAB — C-REACTIVE PROTEIN: CRP: 0.9 mg/dL (ref <1.0)

## 2024-02-21 MED ORDER — CHLORHEXIDINE GLUCONATE 4 % EX SOLN
60.0000 mL | CUTANEOUS | Status: AC
  Administered 2024-02-22: 4 via TOPICAL
  Filled 2024-02-21: qty 60

## 2024-02-21 MED ORDER — POTASSIUM CHLORIDE CRYS ER 20 MEQ PO TBCR
40.0000 meq | EXTENDED_RELEASE_TABLET | Freq: Once | ORAL | Status: AC
  Administered 2024-02-21: 40 meq via ORAL
  Filled 2024-02-21: qty 2

## 2024-02-21 MED ORDER — ADULT MULTIVITAMIN W/MINERALS CH
1.0000 | ORAL_TABLET | Freq: Every day | ORAL | Status: DC
  Administered 2024-02-21 – 2024-02-24 (×4): 1 via ORAL
  Filled 2024-02-21 (×4): qty 1

## 2024-02-21 MED ORDER — CHLORHEXIDINE GLUCONATE 4 % EX SOLN: 60.0000 mL | Freq: Once | CUTANEOUS | Status: DC

## 2024-02-21 MED ORDER — CEFAZOLIN SODIUM-DEXTROSE 2-4 GM/100ML-% IV SOLN
2.0000 g | INTRAVENOUS | Status: AC
  Administered 2024-02-22: 2 g via INTRAVENOUS

## 2024-02-21 MED ORDER — MUPIROCIN 2 % EX OINT
1.0000 | TOPICAL_OINTMENT | Freq: Two times a day (BID) | CUTANEOUS | Status: DC
  Administered 2024-02-22: 1 via NASAL
  Filled 2024-02-21: qty 22

## 2024-02-21 MED ORDER — ENSURE ENLIVE PO LIQD
237.0000 mL | Freq: Two times a day (BID) | ORAL | Status: DC
  Administered 2024-02-23 – 2024-02-24 (×3): 237 mL via ORAL

## 2024-02-21 NOTE — Plan of Care (Signed)
  Problem: Education: Goal: Knowledge of General Education information will improve Description: Including pain rating scale, medication(s)/side effects and non-pharmacologic comfort measures Outcome: Progressing   Problem: Health Behavior/Discharge Planning: Goal: Ability to manage health-related needs will improve Outcome: Progressing   Problem: Clinical Measurements: Goal: Ability to maintain clinical measurements within normal limits will improve Outcome: Progressing Goal: Will remain free from infection Outcome: Progressing Goal: Diagnostic test results will improve Outcome: Progressing Goal: Respiratory complications will improve Outcome: Progressing Goal: Cardiovascular complication will be avoided Outcome: Progressing   Problem: Activity: Goal: Risk for activity intolerance will decrease Outcome: Progressing   Problem: Nutrition: Goal: Adequate nutrition will be maintained Outcome: Progressing   Problem: Coping: Goal: Level of anxiety will decrease Outcome: Progressing   Problem: Elimination: Goal: Will not experience complications related to bowel motility Outcome: Progressing Goal: Will not experience complications related to urinary retention Outcome: Progressing   Problem: Pain Managment: Goal: General experience of comfort will improve and/or be controlled Outcome: Progressing   Problem: Safety: Goal: Ability to remain free from injury will improve Outcome: Progressing   Problem: Skin Integrity: Goal: Risk for impaired skin integrity will decrease Outcome: Progressing   Problem: Education: Goal: Ability to describe self-care measures that may prevent or decrease complications (Diabetes Survival Skills Education) will improve Outcome: Progressing Goal: Individualized Educational Video(s) Outcome: Progressing   Problem: Coping: Goal: Ability to adjust to condition or change in health will improve Outcome: Progressing   Problem: Fluid  Volume: Goal: Ability to maintain a balanced intake and output will improve Outcome: Progressing   Problem: Health Behavior/Discharge Planning: Goal: Ability to identify and utilize available resources and services will improve Outcome: Progressing Goal: Ability to manage health-related needs will improve Outcome: Progressing   Problem: Metabolic: Goal: Ability to maintain appropriate glucose levels will improve Outcome: Progressing   Problem: Nutritional: Goal: Maintenance of adequate nutrition will improve Outcome: Progressing Goal: Progress toward achieving an optimal weight will improve Outcome: Progressing   Problem: Skin Integrity: Goal: Risk for impaired skin integrity will decrease Outcome: Progressing

## 2024-02-21 NOTE — Progress Notes (Signed)
 US  tech in to perform vascular US  as ordered. Pt has been medicated for pain in left hip/knee area. Pt notified that IR hip aspiration has been scheduled and will be performed here per MD Mason Sole.

## 2024-02-21 NOTE — H&P (View-Only) (Signed)
 Reason for Consult: Subacute fracture left hip Referring Physician: Dr. Estela Held  Tiffany Hanson is an 78 y.o. female.  HPI: Tiffany Hanson fell going to the airport on her way to [place].  She was able to get up get into the transportation [place] she walked through security and then down to the gate and started having pain she was therefore placed in a wheelchair and completed her [place] trip in the wheelchair not being able to participate in many activities.  She was complaining of left hip pain so when she got back to the [place]  she went to the chiropractor's office.  He took an x-ray but was unable to get an official reading until several days later.  Once he got the reading he told her to call an orthopedist to get an appointment which she did.  Her appointment was scheduled for April 28.  However in the interim she started having bilateral leg edema mainly in her feet and went to the emergency room to evaluate that.  In the process of evaluating that she was worked up for her left hip pain and was found to have a left hip fracture  78 yo F with obesity, DM, HTN who presented with inability to walk and left hip discomfort.   Tiffany Hanson 2 weeks ago, at the airport boarding the shuttle from the longterm parking lot.  Proceeded on her vacation to [place], but was limited by leg weakness and confined to her room at the resort (only using a wheelchair to come down for meals).   On returning, she went to a chiropractor 1 week PTA, had an x-ray.  2 days ago, the x-ray was reportedly read, and she believes they told her to call an orthopedist for an appointment.  Today, because of the delay for the appointment, she came to the ER.     In the ER, radiograph showed displaced subcapital hip fracture. Case discussed with Dr. Phyllis Breeze, Orthopedics.    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   Lichen sclerosus 05/09/2013   Osteopenia    Personal history of chemotherapy    Right breast   Personal history of radiation therapy    Right breast   S/P radiation therapy    completed in April 2010   Superficial fungus infection of skin 04/27/2015   Vaginal Pap smear, abnormal    Vitiligo     Past Surgical History:  Procedure Laterality Date   BLADDER SUSPENSION     17 to 18 years ago   BREAST LUMPECTOMY  08/20/08   HYSTEROSCOPY     TUBAL LIGATION  1993    Family History  Problem Relation Age of Onset   Heart attack Mother    Diabetes Mother    Cancer Maternal Aunt        breast   Cancer Paternal Aunt        breast   Stroke Father    Heart disease Father    Lupus Sister    Stroke Paternal Grandmother    Diabetes Brother    Other Brother        spinal meningitis   Stroke Brother    Sleep apnea Son    Hearing loss Son  Cancer Son        on kidney   Other Son        hip surgery    Social History:  reports that she has never smoked. She has never used smokeless tobacco. She reports that she does not drink alcohol and does not use drugs.  Allergies: No Known Allergies  Medications: Prior to Admission:  Medications Prior to Admission  Medication Sig Dispense Refill Last Dose/Taking   amLODipine (NORVASC) 5 MG tablet Take 5 mg by mouth daily.   02/20/2024 Morning   Cholecalciferol (VITAMIN D -3) 5000 UNITS TABS Take 1 tablet by mouth daily.   02/20/2024 Morning   empagliflozin  (JARDIANCE ) 10 MG TABS tablet Take 10 mg by mouth daily.   02/20/2024 Morning   lisinopril  (ZESTRIL ) 10 MG tablet Take 10 mg by mouth daily.   02/20/2024 Morning   mometasone  (ELOCON ) 0.1 % cream Apply to rash on back QD on Monday, Wednesday, Friday, and Saturday PRN. 45 g 1 Past Week   montelukast  (SINGULAIR ) 10 MG tablet Take 1 tablet (10 mg total) by mouth at bedtime. 30 tablet  0 02/19/2024 Bedtime   pantoprazole  (PROTONIX ) 40 MG tablet Take 40 mg by mouth 2 (two) times daily before a meal.   02/20/2024 Morning   simvastatin  (ZOCOR ) 20 MG tablet Take 20 mg by mouth every evening.   02/19/2024 Bedtime   tretinoin  (RETIN-A ) 0.025 % cream Apply a pea sized amount to the entire face QHS. (Patient taking differently: Apply 1 Application topically at bedtime. Apply a pea sized amount to the entire face QHS.) 45 g 11 02/19/2024 Bedtime   ACCU-CHEK FASTCLIX LANCETS MISC       ONE TOUCH ULTRA TEST test strip        Results for orders placed or performed during the hospital encounter of 02/20/24 (from the past 48 hours)  Brain natriuretic peptide     Status: None   Collection Time: 02/20/24  1:20 PM  Result Value Ref Range   B Natriuretic Peptide 18.0 0.0 - 100.0 pg/mL    Comment: Performed at [facility], [address] [place], [place] [address]  CBC     Status: Abnormal   Collection Time: 02/20/24  1:20 PM  Result Value Ref Range   WBC 9.0 4.0 - 10.5 K/uL   RBC 4.89 3.87 - 5.11 MIL/uL   Hemoglobin 14.3 12.0 - 15.0 g/dL   HCT 60.4 54.0 - 98.1 %   MCV 88.8 80.0 - 100.0 fL   MCH 29.2 26.0 - 34.0 pg   MCHC 32.9 30.0 - 36.0 g/dL   RDW 19.1 47.8 - 29.5 %   Platelets 413 (H) 150 - 400 K/uL   nRBC 0.0 0.0 - 0.2 %    Comment: Performed at [facility], [address], [place], [place] [address]  Basic metabolic panel     Status: Abnormal   Collection Time: 02/20/24  1:20 PM  Result Value Ref Range   Sodium 139 135 - 145 mmol/L   Potassium 3.4 (L) 3.5 - 5.1 mmol/L   Chloride 102 98 - 111 mmol/L   CO2 25 22 - 32 mmol/L   Glucose, Bld 117 (H) 70 - 99 mg/dL    Comment: Glucose reference range applies only to samples taken after fasting for at least 8 hours.   BUN 12 8 - 23 mg/dL   Creatinine, Ser 8.65 0.44 - 1.00 mg/dL   Calcium 9.3 8.9 - 78.4 mg/dL   GFR, Estimated >69 >62  mL/min    Comment: (NOTE) Calculated using the CKD-EPI Creatinine Equation (2021)    Anion gap 12  5 - 15    Comment: Performed at [facility], [address], [place], [place] [address]  Sedimentation rate     Status: None   Collection Time: 02/20/24  6:30 PM  Result Value Ref Range   Sed Rate 14 0 - 22 mm/hr    Comment: Performed at [facility], [address], [place], [place] [address]  Type and screen [facility]     Status: None   Collection Time: 02/20/24  9:30 PM  Result Value Ref Range   ABO/RH(D) AB POS    Antibody Screen NEG    Sample Expiration      02/23/2024,2359 Performed at [facility], [address], [place], [place] [address]   Glucose, capillary     Status: Abnormal   Collection Time: 02/20/24  9:56 PM  Result Value Ref Range   Glucose-Capillary 164 (H) 70 - 99 mg/dL    Comment: Glucose reference range applies only to samples taken after fasting for at least 8 hours.  CBC     Status: None   Collection Time: 02/21/24  4:15 AM  Result Value Ref Range   WBC 9.4 4.0 - 10.5 K/uL   RBC 4.63 3.87 - 5.11 MIL/uL   Hemoglobin 13.3 12.0 - 15.0 g/dL   HCT 63.8 75.6 - 43.3 %   MCV 90.3 80.0 - 100.0 fL   MCH 28.7 26.0 - 34.0 pg   MCHC 31.8 30.0 - 36.0 g/dL   RDW 29.5 18.8 - 41.6 %   Platelets 373 150 - 400 K/uL   nRBC 0.0 0.0 - 0.2 %    Comment: Performed at [facility], [address] [place], [place] [address]  Basic metabolic panel     Status: Abnormal   Collection Time: 02/21/24  4:15 AM  Result Value Ref Range   Sodium 140 135 - 145 mmol/L   Potassium 3.2 (L) 3.5 - 5.1 mmol/L   Chloride 104 98 - 111 mmol/L   CO2 25 22 - 32 mmol/L   Glucose, Bld 105 (H) 70 - 99 mg/dL    Comment: Glucose reference range applies only to samples taken after fasting for at least 8 hours.   BUN 12 8 - 23 mg/dL   Creatinine, Ser 1.60 0.44 - 1.00 mg/dL   Calcium 8.9 8.9 - 10.9 mg/dL   GFR, Estimated >32 >35 mL/min    Comment: (NOTE) Calculated using the CKD-EPI Creatinine Equation (2021)    Anion gap 11 5 - 15    Comment: Performed at [facility], [address] [place], [place] [address]  Magnesium     Status: None   Collection Time: 02/21/24  4:15 AM  Result Value Ref Range   Magnesium 2.2 1.7 - 2.4 mg/dL    Comment: Performed at [facility], [address], [place], [place] [address]  Glucose, capillary     Status: Abnormal   Collection Time: 02/21/24  7:44 AM  Result Value Ref Range   Glucose-Capillary 103 (H) 70 - 99 mg/dL    Comment: Glucose reference range applies only to samples taken after fasting for at least 8 hours.   Comment 1 Notify RN    Comment 2 Document in Chart     DG Chest 2 View Result Date: 02/20/2024 CLINICAL DATA:  fall 2 weeks ago EXAM: CHEST - 2 VIEW COMPARISON:  Chest x-ray 04/07/2023.  CT chest 09/07/2008 FINDINGS: The heart and mediastinal contours are within normal limits. No focal consolidation. No pulmonary edema. No pleural effusion. No pneumothorax. No acute osseous abnormality. Multilevel degenerative change of the spine. Chronic appearing midthoracic vertebral body height loss. IMPRESSION: No active cardiopulmonary disease. Electronically Signed   By: Morgane  Naveau M.D.   On: 02/20/2024 17:37   DG Hip Unilat W or Wo Pelvis 2-3 Views Left Result Date: 02/20/2024 CLINICAL DATA:  fall 2 weeks ago EXAM: DG HIP (WITH OR WITHOUT PELVIS) 2-3V LEFT COMPARISON:  None Available. FINDINGS: Acute superiorly displaced subcapital left femoral neck fracture. No left hip dislocation. No acute displaced fracture or dislocation of the right hip on frontal view. No acute displaced fracture or diastasis of the bones of the pelvis. There is no evidence of severe arthropathy or aggressive appearing focal bone abnormality of the bones of the pelvis or hips. Anchor suture devices noted along the pubic symphysis bilaterally. Degenerative changes of the visualized lower lumbar spine. IMPRESSION: Acute superiorly displaced subcapital left femoral neck fracture. Electronically Signed   By: Morgane  Naveau M.D.   On: 02/20/2024 17:36    Review  of Systems system review.  Patient denies unexpected weight loss or gain blurred vision difficulty swallowing chest pain shortness of breath constipation or diarrhea urgency frequency joint pain skin changes numbness tingling nervousness anxiety easy bleeding excessive thirst or unusual allergy  Blood pressure 114/68, pulse 71, temperature 97.9 F (36.6 C), temperature source Oral, resp. rate 12, height 5\' 5"  (1.651 m), weight 76.9 kg, SpO2 99%. Physical Exam Vitals and nursing note reviewed.  Constitutional:      General: She is not in acute distress.    Appearance: Normal appearance. She is normal weight. She is not ill-appearing, toxic-appearing or diaphoretic.  HENT:     Head: Normocephalic and atraumatic.     Right Ear: External ear normal.     Left Ear: External ear normal.     Nose: Nose normal. No congestion or rhinorrhea.     Mouth/Throat:     Mouth: Mucous membranes are moist.     Pharynx: No oropharyngeal exudate or posterior oropharyngeal erythema.  Eyes:     General: No scleral icterus.       Right eye: No discharge.        Left eye: No discharge.     Extraocular Movements: Extraocular movements intact.     Conjunctiva/sclera: Conjunctivae normal.     Pupils: Pupils are equal, round, and reactive to light.  Cardiovascular:     Rate and Rhythm: Normal rate and regular rhythm.     Pulses: Normal pulses.     Heart sounds: Normal heart sounds.     Comments: She has mild ankle edema she says it is gone down since yesterday this is noted on both sides of the body Pulmonary:     Effort: Pulmonary effort is normal. No respiratory distress.     Breath sounds: Normal breath sounds. No stridor.  Chest:     Chest wall: No tenderness.  Abdominal:     General: Abdomen is flat. Bowel sounds are normal. There is no distension.     Palpations: Abdomen is soft. There is no mass.     Tenderness: There is no abdominal tenderness.  Musculoskeletal:     Cervical back: Normal range of  motion and neck supple. No rigidity or tenderness.     Right lower leg: Edema present.     Left lower leg: Edema present.  Comments: Upper extremities the skin looks normal inspection no abnormalities range of motion no pain crepitation or contracture all joints are reduced without subluxation muscle tone strength normal no atrophy or spasticity  Similar findings noted in the right lower extremity  Left lower extremity she has about 3 cm of shortening the limb is externally rotated the skin is intact she is tender on the proximal thigh the leg can be rotated inward to neutral the knee and ankle are not dislocated muscle tone and strength are normal no contractures or abnormal movements  Lymphadenopathy:     Cervical: No cervical adenopathy.  Skin:    General: Skin is warm and dry.     Capillary Refill: Capillary refill takes less than 2 seconds.  Neurological:     General: No focal deficit present.     Mental Status: She is alert and oriented to person, place, and time.     Cranial Nerves: No cranial nerve deficit.     Sensory: No sensory deficit.     Motor: No weakness.     Gait: Gait abnormal.     Deep Tendon Reflexes: Reflexes normal.  Psychiatric:        Mood and Affect: Mood normal.        Behavior: Behavior normal.        Thought Content: Thought content normal.        Judgment: Judgment normal.     Assessment/Plan:  Data reviewed includes chest x-ray report CLINICAL DATA:  fall 2 weeks ago   EXAM: CHEST - 2 VIEW   COMPARISON:  Chest x-ray 04/07/2023. CT chest 09/07/2008   FINDINGS: The heart and mediastinal contours are within normal limits.   No focal consolidation. No pulmonary edema. No pleural effusion. No pneumothorax.   No acute osseous abnormality. Multilevel degenerative change of the spine. Chronic appearing midthoracic vertebral body height loss.   IMPRESSION: No active cardiopulmonary disease.     Electronically Signed   By: Morgane  Naveau  M.D.   On: 02/20/2024 17:37  I will independently read the hip image and pelvic x-ray  AP pelvis AP lateral left hip  The left lower extremity is superiorly displaced with obvious shortening of about 3 cm.  The femoral head is still in the acetabulum however the neck has been fractured.  There is abnormal rotation of the limb.  There is also blunting of the margins of the fracture.  This most likely is secondary to delayed presentation of fracture but cannot rule out infection especially in a diabetic  Diagnoses Subacute left hip fracture with 3 cm left limb shortening  Rule out infection left hip  Rule out deep vein thrombosis  Plan  Aspiration Gram stain culture left hip rule out infection  Ultrasound studies to rule out DVT  If the aspiration Gram stain is negative then proceed with bipolar hip replacement on the left if it is positive proceed with irrigation debridement left hip and delayed hip replacement  I have discussed the nature of the problems with the patient and her daughter.  I will list some of the complications but not all.  She is at risk for limb shortening and need for shoe lift, this could lead to a postoperative limp, she is at risk for infection and then the usual hip issues which include DVT, PE, dislocation, infection.  This is a very high risk situation.      Elsa Halls 02/21/2024, 8:29 AM

## 2024-02-21 NOTE — Consult Note (Signed)
 Reason for Consult: Subacute fracture left hip Referring Physician: Dr. Estela Held  Tiffany Hanson is an 78 y.o. female.  HPI: Tiffany Hanson fell going to the airport on her way to [place].  She was able to get up get into the transportation [place] she walked through security and then down to the gate and started having pain she was therefore placed in a wheelchair and completed her [place] trip in the wheelchair not being able to participate in many activities.  She was complaining of left hip pain so when she got back to the [place]  she went to the chiropractor's office.  He took an x-ray but was unable to get an official reading until several days later.  Once he got the reading he told her to call an orthopedist to get an appointment which she did.  Her appointment was scheduled for April 28.  However in the interim she started having bilateral leg edema mainly in her feet and went to the emergency room to evaluate that.  In the process of evaluating that she was worked up for her left hip pain and was found to have a left hip fracture  78 yo F with obesity, DM, HTN who presented with inability to walk and left hip discomfort.   Tiffany Hanson 2 weeks ago, at the airport boarding the shuttle from the longterm parking lot.  Proceeded on her vacation to [place], but was limited by leg weakness and confined to her room at the resort (only using a wheelchair to come down for meals).   On returning, she went to a chiropractor 1 week PTA, had an x-ray.  2 days ago, the x-ray was reportedly read, and she believes they told her to call an orthopedist for an appointment.  Today, because of the delay for the appointment, she came to the ER.     In the ER, radiograph showed displaced subcapital hip fracture. Case discussed with Dr. Phyllis Breeze, Orthopedics.    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   Lichen sclerosus 05/09/2013   Osteopenia    Personal history of chemotherapy    Right breast   Personal history of radiation therapy    Right breast   S/P radiation therapy    completed in April 2010   Superficial fungus infection of skin 04/27/2015   Vaginal Pap smear, abnormal    Vitiligo     Past Surgical History:  Procedure Laterality Date   BLADDER SUSPENSION     17 to 18 years ago   BREAST LUMPECTOMY  08/20/08   HYSTEROSCOPY     TUBAL LIGATION  1993    Family History  Problem Relation Age of Onset   Heart attack Mother    Diabetes Mother    Cancer Maternal Aunt        breast   Cancer Paternal Aunt        breast   Stroke Father    Heart disease Father    Lupus Sister    Stroke Paternal Grandmother    Diabetes Brother    Other Brother        spinal meningitis   Stroke Brother    Sleep apnea Son    Hearing loss Son  Cancer Son        on kidney   Other Son        hip surgery    Social History:  reports that she has never smoked. She has never used smokeless tobacco. She reports that she does not drink alcohol and does not use drugs.  Allergies: No Known Allergies  Medications: Prior to Admission:  Medications Prior to Admission  Medication Sig Dispense Refill Last Dose/Taking   amLODipine (NORVASC) 5 MG tablet Take 5 mg by mouth daily.   02/20/2024 Morning   Cholecalciferol (VITAMIN D -3) 5000 UNITS TABS Take 1 tablet by mouth daily.   02/20/2024 Morning   empagliflozin  (JARDIANCE ) 10 MG TABS tablet Take 10 mg by mouth daily.   02/20/2024 Morning   lisinopril  (ZESTRIL ) 10 MG tablet Take 10 mg by mouth daily.   02/20/2024 Morning   mometasone  (ELOCON ) 0.1 % cream Apply to rash on back QD on Monday, Wednesday, Friday, and Saturday PRN. 45 g 1 Past Week   montelukast  (SINGULAIR ) 10 MG tablet Take 1 tablet (10 mg total) by mouth at bedtime. 30 tablet  0 02/19/2024 Bedtime   pantoprazole  (PROTONIX ) 40 MG tablet Take 40 mg by mouth 2 (two) times daily before a meal.   02/20/2024 Morning   simvastatin  (ZOCOR ) 20 MG tablet Take 20 mg by mouth every evening.   02/19/2024 Bedtime   tretinoin  (RETIN-A ) 0.025 % cream Apply a pea sized amount to the entire face QHS. (Patient taking differently: Apply 1 Application topically at bedtime. Apply a pea sized amount to the entire face QHS.) 45 g 11 02/19/2024 Bedtime   ACCU-CHEK FASTCLIX LANCETS MISC       ONE TOUCH ULTRA TEST test strip        Results for orders placed or performed during the hospital encounter of 02/20/24 (from the past 48 hours)  Brain natriuretic peptide     Status: None   Collection Time: 02/20/24  1:20 PM  Result Value Ref Range   B Natriuretic Peptide 18.0 0.0 - 100.0 pg/mL    Comment: Performed at [facility], [address], [place], [place] [address]  CBC     Status: Abnormal   Collection Time: 02/20/24  1:20 PM  Result Value Ref Range   WBC 9.0 4.0 - 10.5 K/uL   RBC 4.89 3.87 - 5.11 MIL/uL   Hemoglobin 14.3 12.0 - 15.0 g/dL   HCT 98.1 19.1 - 47.8 %   MCV 88.8 80.0 - 100.0 fL   MCH 29.2 26.0 - 34.0 pg   MCHC 32.9 30.0 - 36.0 g/dL   RDW 29.5 62.1 - 30.8 %   Platelets 413 (H) 150 - 400 K/uL   nRBC 0.0 0.0 - 0.2 %    Comment: Performed at [facility], [address], [place], [place] [address]  Basic metabolic panel     Status: Abnormal   Collection Time: 02/20/24  1:20 PM  Result Value Ref Range   Sodium 139 135 - 145 mmol/L   Potassium 3.4 (L) 3.5 - 5.1 mmol/L   Chloride 102 98 - 111 mmol/L   CO2 25 22 - 32 mmol/L   Glucose, Bld 117 (H) 70 - 99 mg/dL    Comment: Glucose reference range applies only to samples taken after fasting for at least 8 hours.   BUN 12 8 - 23 mg/dL   Creatinine, Ser 6.96 0.44 - 1.00 mg/dL   Calcium 9.3 8.9 - 29.5 mg/dL   GFR, Estimated >28 >41  mL/min    Comment: (NOTE) Calculated using the CKD-EPI Creatinine Equation (2021)    Anion gap 12  5 - 15    Comment: Performed at [facility], [address], [place], [place] [address]  Sedimentation rate     Status: None   Collection Time: 02/20/24  6:30 PM  Result Value Ref Range   Sed Rate 14 0 - 22 mm/hr    Comment: Performed at [facility], [address], [place], [place] [address]  Type and screen [facility]     Status: None   Collection Time: 02/20/24  9:30 PM  Result Value Ref Range   ABO/RH(D) AB POS    Antibody Screen NEG    Sample Expiration      02/23/2024,2359 Performed at [facility], [address], [place], [place] [address]   Glucose, capillary     Status: Abnormal   Collection Time: 02/20/24  9:56 PM  Result Value Ref Range   Glucose-Capillary 164 (H) 70 - 99 mg/dL    Comment: Glucose reference range applies only to samples taken after fasting for at least 8 hours.  CBC     Status: None   Collection Time: 02/21/24  4:15 AM  Result Value Ref Range   WBC 9.4 4.0 - 10.5 K/uL   RBC 4.63 3.87 - 5.11 MIL/uL   Hemoglobin 13.3 12.0 - 15.0 g/dL   HCT 63.8 75.6 - 43.3 %   MCV 90.3 80.0 - 100.0 fL   MCH 28.7 26.0 - 34.0 pg   MCHC 31.8 30.0 - 36.0 g/dL   RDW 29.5 18.8 - 41.6 %   Platelets 373 150 - 400 K/uL   nRBC 0.0 0.0 - 0.2 %    Comment: Performed at [facility], [address] [place], [place] [address]  Basic metabolic panel     Status: Abnormal   Collection Time: 02/21/24  4:15 AM  Result Value Ref Range   Sodium 140 135 - 145 mmol/L   Potassium 3.2 (L) 3.5 - 5.1 mmol/L   Chloride 104 98 - 111 mmol/L   CO2 25 22 - 32 mmol/L   Glucose, Bld 105 (H) 70 - 99 mg/dL    Comment: Glucose reference range applies only to samples taken after fasting for at least 8 hours.   BUN 12 8 - 23 mg/dL   Creatinine, Ser 1.60 0.44 - 1.00 mg/dL   Calcium 8.9 8.9 - 10.9 mg/dL   GFR, Estimated >32 >35 mL/min    Comment: (NOTE) Calculated using the CKD-EPI Creatinine Equation (2021)    Anion gap 11 5 - 15    Comment: Performed at [facility], [address] [place], [place] [address]  Magnesium     Status: None   Collection Time: 02/21/24  4:15 AM  Result Value Ref Range   Magnesium 2.2 1.7 - 2.4 mg/dL    Comment: Performed at [facility], [address], [place], [place] [address]  Glucose, capillary     Status: Abnormal   Collection Time: 02/21/24  7:44 AM  Result Value Ref Range   Glucose-Capillary 103 (H) 70 - 99 mg/dL    Comment: Glucose reference range applies only to samples taken after fasting for at least 8 hours.   Comment 1 Notify RN    Comment 2 Document in Chart     DG Chest 2 View Result Date: 02/20/2024 CLINICAL DATA:  fall 2 weeks ago EXAM: CHEST - 2 VIEW COMPARISON:  Chest x-ray 04/07/2023.  CT chest 09/07/2008 FINDINGS: The heart and mediastinal contours are within normal limits. No focal consolidation. No pulmonary edema. No pleural effusion. No pneumothorax. No acute osseous abnormality. Multilevel degenerative change of the spine. Chronic appearing midthoracic vertebral body height loss. IMPRESSION: No active cardiopulmonary disease. Electronically Signed   By: Morgane  Naveau M.D.   On: 02/20/2024 17:37   DG Hip Unilat W or Wo Pelvis 2-3 Views Left Result Date: 02/20/2024 CLINICAL DATA:  fall 2 weeks ago EXAM: DG HIP (WITH OR WITHOUT PELVIS) 2-3V LEFT COMPARISON:  None Available. FINDINGS: Acute superiorly displaced subcapital left femoral neck fracture. No left hip dislocation. No acute displaced fracture or dislocation of the right hip on frontal view. No acute displaced fracture or diastasis of the bones of the pelvis. There is no evidence of severe arthropathy or aggressive appearing focal bone abnormality of the bones of the pelvis or hips. Anchor suture devices noted along the pubic symphysis bilaterally. Degenerative changes of the visualized lower lumbar spine. IMPRESSION: Acute superiorly displaced subcapital left femoral neck fracture. Electronically Signed   By: Morgane  Naveau M.D.   On: 02/20/2024 17:36    Review  of Systems system review.  Patient denies unexpected weight loss or gain blurred vision difficulty swallowing chest pain shortness of breath constipation or diarrhea urgency frequency joint pain skin changes numbness tingling nervousness anxiety easy bleeding excessive thirst or unusual allergy  Blood pressure 114/68, pulse 71, temperature 97.9 F (36.6 C), temperature source Oral, resp. rate 12, height 5\' 5"  (1.651 m), weight 76.9 kg, SpO2 99%. Physical Exam Vitals and nursing note reviewed.  Constitutional:      General: She is not in acute distress.    Appearance: Normal appearance. She is normal weight. She is not ill-appearing, toxic-appearing or diaphoretic.  HENT:     Head: Normocephalic and atraumatic.     Right Ear: External ear normal.     Left Ear: External ear normal.     Nose: Nose normal. No congestion or rhinorrhea.     Mouth/Throat:     Mouth: Mucous membranes are moist.     Pharynx: No oropharyngeal exudate or posterior oropharyngeal erythema.  Eyes:     General: No scleral icterus.       Right eye: No discharge.        Left eye: No discharge.     Extraocular Movements: Extraocular movements intact.     Conjunctiva/sclera: Conjunctivae normal.     Pupils: Pupils are equal, round, and reactive to light.  Cardiovascular:     Rate and Rhythm: Normal rate and regular rhythm.     Pulses: Normal pulses.     Heart sounds: Normal heart sounds.     Comments: She has mild ankle edema she says it is gone down since yesterday this is noted on both sides of the body Pulmonary:     Effort: Pulmonary effort is normal. No respiratory distress.     Breath sounds: Normal breath sounds. No stridor.  Chest:     Chest wall: No tenderness.  Abdominal:     General: Abdomen is flat. Bowel sounds are normal. There is no distension.     Palpations: Abdomen is soft. There is no mass.     Tenderness: There is no abdominal tenderness.  Musculoskeletal:     Cervical back: Normal range of  motion and neck supple. No rigidity or tenderness.     Right lower leg: Edema present.     Left lower leg: Edema present.  Comments: Upper extremities the skin looks normal inspection no abnormalities range of motion no pain crepitation or contracture all joints are reduced without subluxation muscle tone strength normal no atrophy or spasticity  Similar findings noted in the right lower extremity  Left lower extremity she has about 3 cm of shortening the limb is externally rotated the skin is intact she is tender on the proximal thigh the leg can be rotated inward to neutral the knee and ankle are not dislocated muscle tone and strength are normal no contractures or abnormal movements  Lymphadenopathy:     Cervical: No cervical adenopathy.  Skin:    General: Skin is warm and dry.     Capillary Refill: Capillary refill takes less than 2 seconds.  Neurological:     General: No focal deficit present.     Mental Status: She is alert and oriented to person, place, and time.     Cranial Nerves: No cranial nerve deficit.     Sensory: No sensory deficit.     Motor: No weakness.     Gait: Gait abnormal.     Deep Tendon Reflexes: Reflexes normal.  Psychiatric:        Mood and Affect: Mood normal.        Behavior: Behavior normal.        Thought Content: Thought content normal.        Judgment: Judgment normal.     Assessment/Plan:  Data reviewed includes chest x-ray report CLINICAL DATA:  fall 2 weeks ago   EXAM: CHEST - 2 VIEW   COMPARISON:  Chest x-ray 04/07/2023. CT chest 09/07/2008   FINDINGS: The heart and mediastinal contours are within normal limits.   No focal consolidation. No pulmonary edema. No pleural effusion. No pneumothorax.   No acute osseous abnormality. Multilevel degenerative change of the spine. Chronic appearing midthoracic vertebral body height loss.   IMPRESSION: No active cardiopulmonary disease.     Electronically Signed   By: Morgane  Naveau  M.D.   On: 02/20/2024 17:37  I will independently read the hip image and pelvic x-ray  AP pelvis AP lateral left hip  The left lower extremity is superiorly displaced with obvious shortening of about 3 cm.  The femoral head is still in the acetabulum however the neck has been fractured.  There is abnormal rotation of the limb.  There is also blunting of the margins of the fracture.  This most likely is secondary to delayed presentation of fracture but cannot rule out infection especially in a diabetic  Diagnoses Subacute left hip fracture with 3 cm left limb shortening  Rule out infection left hip  Rule out deep vein thrombosis  Plan  Aspiration Gram stain culture left hip rule out infection  Ultrasound studies to rule out DVT  If the aspiration Gram stain is negative then proceed with bipolar hip replacement on the left if it is positive proceed with irrigation debridement left hip and delayed hip replacement  I have discussed the nature of the problems with the patient and her daughter.  I will list some of the complications but not all.  She is at risk for limb shortening and need for shoe lift, this could lead to a postoperative limp, she is at risk for infection and then the usual hip issues which include DVT, PE, dislocation, infection.  This is a very high risk situation.      Elsa Halls 02/21/2024, 8:29 AM

## 2024-02-21 NOTE — Progress Notes (Signed)
 Initial Nutrition Assessment  DOCUMENTATION CODES:   Not applicable  INTERVENTION:   - Ensure Enlive po BID, each supplement provides 350 kcal and 20 grams of protein  - MVI with minerals daily  NUTRITION DIAGNOSIS:   Increased nutrient needs related to hip fracture as evidenced by estimated needs.  GOAL:   Patient will meet greater than or equal to 90% of their needs  MONITOR:   PO intake, Supplement acceptance, Labs, Weight trends  REASON FOR ASSESSMENT:   Consult Hip fracture protocol  ASSESSMENT:   78 year old female who presented to the ED on 02/20/24 with hip pain and joint swelling. PMH of T2DM, HTN, GERD, breast cancer s/p chemotherapy. Pt admitted with closed left hip fracture.  Noted Orthopedics recommending IR for hip joint aspiration which is planned for 02/22/24. Pt will be made NPO at 0430 on 02/22/24 in preparation for this.  Unable to reach pt via phone call to room. Pt appears to be in Ultrasound at this time. Unable to obtain diet and weight history. Pt currently on a Carb Modified diet with no meal completions charted.  Reviewed weight history in chart. Last available weight PTA is from 04/29/20. Pt with a 7.5 kg (8.9%) weight loss since that time which is not significant for timeframe. However, due to lack of weight measurements since 04/29/20, question whether weight loss may have occurred more acutely.  Will order oral nutrition supplements to aid pt in meeting increased nutrition needs related to hip fracture. Given good DM control noted with hemoglobin A1C of 6.9, will order Ensure supplements to provide maximum kcal and protein. Will also order daily MVI with minerals.  Medications reviewed and include: colace, jardiance , SSI, protonix , klor-con  40 mEq x 1  Labs reviewed: potassium 3.2, hemoglobin A1C 6.9 CBG's: 103-164  NUTRITION - FOCUSED PHYSICAL EXAM:  Unable to complete. RD working remotely.  Diet Order:   Diet Order             Diet NPO  time specified  Diet effective ____           Diet Carb Modified Fluid consistency: Thin; Room service appropriate? Yes  Diet effective now                   EDUCATION NEEDS:   Not appropriate for education at this time  Skin:  Skin Assessment: Reviewed RN Assessment  Last BM:  02/19/24  Height:   Ht Readings from Last 1 Encounters:  02/20/24 5\' 5"  (1.651 m)    Weight:   Wt Readings from Last 1 Encounters:  02/20/24 76.9 kg    BMI:  Body mass index is 28.21 kg/m.  Estimated Nutritional Needs:   Kcal:  1800-2000  Protein:  90-105 grams  Fluid:  1.8-2.0 L    Ernestina Headland, MS, RD, LDN Registered Dietitian II Please see AMiON for contact information.

## 2024-02-21 NOTE — Progress Notes (Signed)
 PROGRESS NOTE    Tiffany Hanson  MVH:[id number] DOB: October 13, 1946 DOA: 02/20/2024 PCP: Jodi Munroe   Brief Narrative:    78 yo F with obesity, DM, HTN who presented with inability to walk and left hip discomfort.   [name] 2 weeks ago, at the airport boarding the shuttle from the longterm parking lot.  Proceeded on her vacation to [place], but was limited by leg weakness and confined to her room at the resort (only using a wheelchair to come down for meals).   On returning, she went to a chiropractor 1 week PTA, had an x-ray.  2 days ago, the x-ray was reportedly read, and she believes they told her to call an orthopedist for an appointment.  Today, because of the delay for the appointment, she came to the ER.     In the ER, radiograph showed displaced subcapital hip fracture. Case discussed with Dr. Phyllis Breeze, Orthopedics who recommends hip joint aspiration with IR.  Discussed case with Dr. Mabel Savage and plan will be to perform aspiration with IR in house on 4/25.  Assessment & Plan:   Principal Problem:   Closed left hip fracture (HCC) Active Problems:   Diabetes (HCC)   Hypertension   Hypokalemia   Mixed hyperlipidemia  Assessment and Plan:  Closed left hip fracture (HCC) Significant delay to presentation.  Orthopedics consulted.  It was relayed to me that there may be increased risk of infection or clot in this case.  However, no fever, leukocytosis or pain to suggest infection.  I will defer infectious evaluation until Dr. Phyllis Breeze has been able to evaluate the patient in person.   Also, no swelling or redness of the legs nor any clinical signs of DVT.   - Analgesics - Obtain US  duplex for DVT (if unable to obtain US  without external rotation of the left hip, do not obtain US )-currently pending -Orthopedics recommending IR for hip joint aspiration and IR has been contacted with plans now for aspiration on 4/25 when IR is available     From the perioperative risk  standpoint, she has an RCRI score of 0.  She can exert to equivalents without symtoms.  She has no active cardiac symptoms.  She is average risk for hip repair and may proceed to surgery - Hold lisinopril  day of surgery - Continue statin     Mixed hyperlipidemia - Continue simvastatin    Hypokalemia - Check Mag - Supplement K   Hypertension BP normal - Continue lisinpril   Diabetes (HCC) Glucose normal here - Check A1c in AM - SS corrections - Continue home Jardiance   DVT prophylaxis:Heparin  Code Status: Full Family Communication: None at bedside. Disposition Plan:  Status is: Inpatient Remains inpatient appropriate because: Need for inpatient evaluation and IV medications.   Consultants:  Orthopedics-Dr. Phyllis Breeze IR  Procedures:  None  Antimicrobials:  None   Subjective: Patient seen and evaluated today with minimal hip pain this morning. No acute concerns or events noted overnight.  Objective: Vitals:   02/20/24 1945 02/20/24 2036 02/20/24 2340 02/21/24 0509  BP: (!) 152/77 (!) 161/99 124/64 114/68  Pulse: 66 79 71 71  Resp: 16 17 18 12   Temp: 98.4 F (36.9 C) 98.6 F (37 C) 98.1 F (36.7 C) 97.9 F (36.6 C)  TempSrc: Oral Oral Oral Oral  SpO2: 97% 98% 100% 99%  Weight:  76.9 kg    Height:  5\' 5"  (1.651 m)      Intake/Output Summary (Last 24 hours) at 02/21/2024  1610 Last data filed at 02/21/2024 0500 Gross per 24 hour  Intake 480 ml  Output --  Net 480 ml   Filed Weights   02/20/24 1301 02/20/24 2036  Weight: 84.4 kg 76.9 kg    Examination:  General exam: Appears calm and comfortable  Respiratory system: Clear to auscultation. Respiratory effort normal. Cardiovascular system: S1 & S2 heard, RRR.  Gastrointestinal system: Abdomen is soft Central nervous system: Alert and awake Extremities: No edema Skin: No significant lesions noted Psychiatry: Flat affect.    Data Reviewed: I have personally reviewed following labs and imaging  studies  CBC: Recent Labs  Lab 02/20/24 1320 02/21/24 0415  WBC 9.0 9.4  HGB 14.3 13.3  HCT 43.4 41.8  MCV 88.8 90.3  PLT 413* 373   Basic Metabolic Panel: Recent Labs  Lab 02/20/24 1320 02/21/24 0415  NA 139 140  K 3.4* 3.2*  CL 102 104  CO2 25 25  GLUCOSE 117* 105*  BUN 12 12  CREATININE 0.87 0.79  CALCIUM 9.3 8.9  MG  --  2.2   GFR: Estimated Creatinine Clearance: 60.4 mL/min (by C-G formula based on SCr of 0.79 mg/dL). Liver Function Tests: No results for input(s): "AST", "ALT", "ALKPHOS", "BILITOT", "PROT", "ALBUMIN" in the last 168 hours. No results for input(s): "LIPASE", "AMYLASE" in the last 168 hours. No results for input(s): "AMMONIA" in the last 168 hours. Coagulation Profile: No results for input(s): "INR", "PROTIME" in the last 168 hours. Cardiac Enzymes: No results for input(s): "CKTOTAL", "CKMB", "CKMBINDEX", "TROPONINI" in the last 168 hours. BNP (last 3 results) No results for input(s): "PROBNP" in the last 8760 hours. HbA1C: No results for input(s): "HGBA1C" in the last 72 hours. CBG: Recent Labs  Lab 02/20/24 2156  GLUCAP 164*   Lipid Profile: No results for input(s): "CHOL", "HDL", "LDLCALC", "TRIG", "CHOLHDL", "LDLDIRECT" in the last 72 hours. Thyroid  Function Tests: No results for input(s): "TSH", "T4TOTAL", "FREET4", "T3FREE", "THYROIDAB" in the last 72 hours. Anemia Panel: No results for input(s): "VITAMINB12", "FOLATE", "FERRITIN", "TIBC", "IRON", "RETICCTPCT" in the last 72 hours. Sepsis Labs: No results for input(s): "PROCALCITON", "LATICACIDVEN" in the last 168 hours.  No results found for this or any previous visit (from the past 240 hours).       Radiology Studies: DG Chest 2 View Result Date: 02/20/2024 CLINICAL DATA:  fall 2 weeks ago EXAM: CHEST - 2 VIEW COMPARISON:  Chest x-ray 04/07/2023. CT chest 09/07/2008 FINDINGS: The heart and mediastinal contours are within normal limits. No focal consolidation. No pulmonary  edema. No pleural effusion. No pneumothorax. No acute osseous abnormality. Multilevel degenerative change of the spine. Chronic appearing midthoracic vertebral body height loss. IMPRESSION: No active cardiopulmonary disease. Electronically Signed   By: Morgane  Naveau M.D.   On: 02/20/2024 17:37   DG Hip Unilat W or Wo Pelvis 2-3 Views Left Result Date: 02/20/2024 CLINICAL DATA:  fall 2 weeks ago EXAM: DG HIP (WITH OR WITHOUT PELVIS) 2-3V LEFT COMPARISON:  None Available. FINDINGS: Acute superiorly displaced subcapital left femoral neck fracture. No left hip dislocation. No acute displaced fracture or dislocation of the right hip on frontal view. No acute displaced fracture or diastasis of the bones of the pelvis. There is no evidence of severe arthropathy or aggressive appearing focal bone abnormality of the bones of the pelvis or hips. Anchor suture devices noted along the pubic symphysis bilaterally. Degenerative changes of the visualized lower lumbar spine. IMPRESSION: Acute superiorly displaced subcapital left femoral neck fracture. Electronically Signed  By: Morgane  Naveau M.D.   On: 02/20/2024 17:36        Scheduled Meds:  docusate sodium   100 mg Oral BID   empagliflozin   10 mg Oral Daily   heparin   5,000 Units Subcutaneous Q8H   insulin  aspart  0-5 Units Subcutaneous QHS   insulin  aspart  0-9 Units Subcutaneous TID WC   lisinopril   10 mg Oral Daily   pantoprazole   40 mg Oral Daily   simvastatin   20 mg Oral QPM     LOS: 1 day    Time spent: 55 minutes    Samantha Ragen Loran Rock, DO [facility]  If 7PM-7AM, please contact night-coverage [email or web address] 02/21/2024, 6:32 AM

## 2024-02-21 NOTE — Plan of Care (Signed)

## 2024-02-22 ENCOUNTER — Inpatient Hospital Stay (HOSPITAL_COMMUNITY)

## 2024-02-22 ENCOUNTER — Encounter (HOSPITAL_COMMUNITY): Payer: Self-pay | Admitting: Family Medicine

## 2024-02-22 ENCOUNTER — Other Ambulatory Visit: Payer: Self-pay

## 2024-02-22 ENCOUNTER — Inpatient Hospital Stay (HOSPITAL_COMMUNITY): Admitting: Anesthesiology

## 2024-02-22 ENCOUNTER — Encounter (HOSPITAL_COMMUNITY)
Admission: EM | Disposition: A | Discharge status: Home Health | Payer: Self-pay | Source: Home / Self Care | Attending: Family Medicine

## 2024-02-22 DIAGNOSIS — E119 Type 2 diabetes mellitus without complications: Secondary | ICD-10-CM

## 2024-02-22 DIAGNOSIS — I10 Essential (primary) hypertension: Secondary | ICD-10-CM

## 2024-02-22 DIAGNOSIS — S72002A Fracture of unspecified part of neck of left femur, initial encounter for closed fracture: Secondary | ICD-10-CM

## 2024-02-22 DIAGNOSIS — E876 Hypokalemia: Secondary | ICD-10-CM | POA: Diagnosis not present

## 2024-02-22 HISTORY — PX: HIP ARTHROPLASTY: SHX981

## 2024-02-22 LAB — SYNOVIAL CELL COUNT + DIFF, W/ CRYSTALS
Crystals, Fluid: NONE SEEN
Eosinophils-Synovial: 0 % (ref 0–1)
Lymphocytes-Synovial Fld: 8 % (ref 0–20)
Monocyte-Macrophage-Synovial Fluid: 3 % — ABNORMAL LOW (ref 50–90)
Neutrophil, Synovial: 89 % — ABNORMAL HIGH (ref 0–25)
Other Cells-SYN: 0
WBC, Synovial: 1980 /mm3 — ABNORMAL HIGH (ref 0–200)

## 2024-02-22 LAB — GLUCOSE, CAPILLARY
Glucose-Capillary: 111 mg/dL — ABNORMAL HIGH (ref 70–99)
Glucose-Capillary: 116 mg/dL — ABNORMAL HIGH (ref 70–99)
Glucose-Capillary: 68 mg/dL — ABNORMAL LOW (ref 70–99)
Glucose-Capillary: 74 mg/dL (ref 70–99)
Glucose-Capillary: 113 mg/dL — ABNORMAL HIGH (ref 70–99)

## 2024-02-22 LAB — BASIC METABOLIC PANEL WITH GFR
Anion gap: 9 (ref 5–15)
BUN: 10 mg/dL (ref 8–23)
CO2: 25 mmol/L (ref 22–32)
Calcium: 9 mg/dL (ref 8.9–10.3)
Chloride: 105 mmol/L (ref 98–111)
Creatinine, Ser: 0.8 mg/dL (ref 0.44–1.00)
GFR, Estimated: >60 mL/min (ref >60)
Glucose, Bld: 103 mg/dL — ABNORMAL HIGH (ref 70–99)
Potassium: 3.7 mmol/L (ref 3.5–5.1)
Sodium: 139 mmol/L (ref 135–145)

## 2024-02-22 LAB — CBC
HCT: 41.5 % (ref 36.0–46.0)
Hemoglobin: 13.4 g/dL (ref 12.0–15.0)
MCH: 28.7 pg (ref 26.0–34.0)
MCHC: 32.3 g/dL (ref 30.0–36.0)
MCV: 88.9 fL (ref 80.0–100.0)
Platelets: 388 10*3/uL (ref 150–400)
RBC: 4.67 MIL/uL (ref 3.87–5.11)
RDW: 13.2 % (ref 11.5–15.5)
WBC: 10 10*3/uL (ref 4.0–10.5)
nRBC: 0 % (ref 0.0–0.2)

## 2024-02-22 LAB — MAGNESIUM: Magnesium: 2.2 mg/dL (ref 1.7–2.4)

## 2024-02-22 LAB — SURGICAL PCR SCREEN
MRSA, PCR: NEGATIVE
Staphylococcus aureus: NEGATIVE

## 2024-02-22 SURGERY — HEMIARTHROPLASTY (BIPOLAR) HIP, POSTERIOR APPROACH FOR FRACTURE
Anesthesia: Spinal | Site: Hip | Laterality: Left

## 2024-02-22 MED ORDER — MENTHOL 3 MG MT LOZG: 1.0000 | LOZENGE | OROMUCOSAL | Status: DC | PRN

## 2024-02-22 MED ORDER — METHOCARBAMOL 1000 MG/10ML IJ SOLN: 500.0000 mg | Freq: Four times a day (QID) | INTRAMUSCULAR | Status: DC | PRN

## 2024-02-22 MED ORDER — CEFAZOLIN SODIUM-DEXTROSE 2-4 GM/100ML-% IV SOLN
2.0000 g | Freq: Four times a day (QID) | INTRAVENOUS | Status: AC
  Administered 2024-02-22 – 2024-02-23 (×2): 2 g via INTRAVENOUS
  Filled 2024-02-22 (×2): qty 100

## 2024-02-22 MED ORDER — BUPIVACAINE IN DEXTROSE 0.75-8.25 % IT SOLN
INTRATHECAL | Status: DC | PRN
  Administered 2024-02-22: 1.6 mL via INTRATHECAL

## 2024-02-22 MED ORDER — METOCLOPRAMIDE HCL 10 MG PO TABS: 5.0000 mg | ORAL_TABLET | Freq: Three times a day (TID) | ORAL | Status: DC | PRN

## 2024-02-22 MED ORDER — TRANEXAMIC ACID-NACL 1000-0.7 MG/100ML-% IV SOLN
INTRAVENOUS | Status: AC
  Filled 2024-02-22: qty 100

## 2024-02-22 MED ORDER — PHENYLEPHRINE 80 MCG/ML (10ML) SYRINGE FOR IV PUSH (FOR BLOOD PRESSURE SUPPORT)
PREFILLED_SYRINGE | INTRAVENOUS | Status: AC
  Filled 2024-02-22: qty 10

## 2024-02-22 MED ORDER — OXYCODONE HCL 5 MG/5ML PO SOLN: 5.0000 mg | Freq: Once | ORAL | Status: DC | PRN

## 2024-02-22 MED ORDER — ONDANSETRON HCL 4 MG PO TABS: 4.0000 mg | ORAL_TABLET | Freq: Four times a day (QID) | ORAL | Status: DC | PRN

## 2024-02-22 MED ORDER — LIDOCAINE HCL (PF) 1 % IJ SOLN
INTRAMUSCULAR | Status: AC
Start: 2024-02-22 — End: ?
  Filled 2024-02-22: qty 5

## 2024-02-22 MED ORDER — ACETAMINOPHEN 500 MG PO TABS
500.0000 mg | ORAL_TABLET | Freq: Four times a day (QID) | ORAL | Status: DC
  Administered 2024-02-22 – 2024-02-23 (×3): 500 mg via ORAL
  Filled 2024-02-22 (×3): qty 1

## 2024-02-22 MED ORDER — METHOCARBAMOL 500 MG PO TABS: 500.0000 mg | ORAL_TABLET | Freq: Four times a day (QID) | ORAL | Status: DC | PRN

## 2024-02-22 MED ORDER — FENTANYL CITRATE PF 50 MCG/ML IJ SOSY: 25.0000 ug | PREFILLED_SYRINGE | INTRAMUSCULAR | Status: DC | PRN

## 2024-02-22 MED ORDER — ONDANSETRON HCL 4 MG/2ML IJ SOLN: 4.0000 mg | Freq: Once | INTRAMUSCULAR | Status: DC | PRN

## 2024-02-22 MED ORDER — DOCUSATE SODIUM 100 MG PO CAPS
100.0000 mg | ORAL_CAPSULE | Freq: Two times a day (BID) | ORAL | Status: DC
  Administered 2024-02-23 (×2): 100 mg via ORAL
  Filled 2024-02-22 (×2): qty 1

## 2024-02-22 MED ORDER — ACETAMINOPHEN 325 MG PO TABS: 325.0000 mg | ORAL_TABLET | Freq: Four times a day (QID) | ORAL | Status: DC | PRN

## 2024-02-22 MED ORDER — SODIUM CHLORIDE 0.9 % IR SOLN
Status: DC | PRN
  Administered 2024-02-22: 3000 mL

## 2024-02-22 MED ORDER — SODIUM CHLORIDE 0.9 % IV SOLN: INTRAVENOUS | Status: AC

## 2024-02-22 MED ORDER — HYDROCODONE-ACETAMINOPHEN 5-325 MG PO TABS: 1.0000 | ORAL_TABLET | ORAL | Status: DC | PRN

## 2024-02-22 MED ORDER — LACTATED RINGERS IV SOLN: INTRAVENOUS | Status: DC

## 2024-02-22 MED ORDER — KETOROLAC TROMETHAMINE 30 MG/ML IJ SOLN: 15.0000 mg | Freq: Once | INTRAMUSCULAR | Status: DC

## 2024-02-22 MED ORDER — CHLORHEXIDINE GLUCONATE 0.12 % MT SOLN
OROMUCOSAL | Status: AC
Start: 2024-02-22 — End: 2024-02-23
  Filled 2024-02-22: qty 15

## 2024-02-22 MED ORDER — TRANEXAMIC ACID-NACL 1000-0.7 MG/100ML-% IV SOLN
1000.0000 mg | Freq: Once | INTRAVENOUS | Status: AC
  Administered 2024-02-22: 1000 mg via INTRAVENOUS
  Filled 2024-02-22: qty 100

## 2024-02-22 MED ORDER — CHLORHEXIDINE GLUCONATE 0.12 % MT SOLN
15.0000 mL | Freq: Once | OROMUCOSAL | Status: AC
  Administered 2024-02-22: 15 mL via OROMUCOSAL

## 2024-02-22 MED ORDER — CEFAZOLIN SODIUM-DEXTROSE 2-4 GM/100ML-% IV SOLN
INTRAVENOUS | Status: AC
  Filled 2024-02-22: qty 100

## 2024-02-22 MED ORDER — LISINOPRIL 10 MG PO TABS: 10.0000 mg | ORAL_TABLET | Freq: Every day | ORAL | Status: DC

## 2024-02-22 MED ORDER — OXYCODONE HCL 5 MG PO TABS: 5.0000 mg | ORAL_TABLET | Freq: Once | ORAL | Status: DC | PRN

## 2024-02-22 MED ORDER — LIDOCAINE HCL (PF) 1 % IJ SOLN
5.0000 mL | Freq: Once | INTRAMUSCULAR | Status: AC
  Administered 2024-02-22: 5 mL

## 2024-02-22 MED ORDER — BUPIVACAINE-EPINEPHRINE (PF) 0.5% -1:200000 IJ SOLN
INTRAMUSCULAR | Status: AC
  Filled 2024-02-22: qty 30

## 2024-02-22 MED ORDER — ONDANSETRON HCL 4 MG/2ML IJ SOLN: 4.0000 mg | Freq: Four times a day (QID) | INTRAMUSCULAR | Status: DC | PRN

## 2024-02-22 MED ORDER — BUPIVACAINE-EPINEPHRINE (PF) 0.5% -1:200000 IJ SOLN
INTRAMUSCULAR | Status: DC | PRN
  Administered 2024-02-22: 30 mL via PERINEURAL

## 2024-02-22 MED ORDER — 0.9 % SODIUM CHLORIDE (POUR BTL) OPTIME
TOPICAL | Status: DC | PRN
  Administered 2024-02-22: 1000 mL

## 2024-02-22 MED ORDER — PHENYLEPHRINE HCL-NACL 20-0.9 MG/250ML-% IV SOLN
INTRAVENOUS | Status: DC | PRN
  Administered 2024-02-22: 50 ug/min via INTRAVENOUS

## 2024-02-22 MED ORDER — ORAL CARE MOUTH RINSE: 15.0000 mL | Freq: Once | OROMUCOSAL | Status: AC

## 2024-02-22 MED ORDER — PROPOFOL 500 MG/50ML IV EMUL
INTRAVENOUS | Status: DC | PRN
  Administered 2024-02-22: 50 mg via INTRAVENOUS
  Administered 2024-02-22: 40 mg via INTRAVENOUS
  Administered 2024-02-22 (×2): 100 ug/kg/min via INTRAVENOUS

## 2024-02-22 MED ORDER — HYDROCODONE-ACETAMINOPHEN 7.5-325 MG PO TABS
1.0000 | ORAL_TABLET | ORAL | Status: DC | PRN
Start: 2024-02-22 — End: 2024-02-24

## 2024-02-22 MED ORDER — PHENOL 1.4 % MT LIQD: 1.0000 | OROMUCOSAL | Status: DC | PRN

## 2024-02-22 MED ORDER — MORPHINE SULFATE (PF) 2 MG/ML IV SOLN: 0.5000 mg | INTRAVENOUS | Status: DC | PRN

## 2024-02-22 MED ORDER — PHENYLEPHRINE 80 MCG/ML (10ML) SYRINGE FOR IV PUSH (FOR BLOOD PRESSURE SUPPORT)
PREFILLED_SYRINGE | INTRAVENOUS | Status: DC | PRN
  Administered 2024-02-22 (×2): 160 ug via INTRAVENOUS
  Administered 2024-02-22: 80 ug via INTRAVENOUS
  Administered 2024-02-22 (×3): 160 ug via INTRAVENOUS

## 2024-02-22 MED ORDER — TRANEXAMIC ACID-NACL 1000-0.7 MG/100ML-% IV SOLN
1000.0000 mg | INTRAVENOUS | Status: AC
  Administered 2024-02-22: 1000 mg via INTRAVENOUS

## 2024-02-22 MED ORDER — METOCLOPRAMIDE HCL 5 MG/ML IJ SOLN: 5.0000 mg | Freq: Three times a day (TID) | INTRAMUSCULAR | Status: DC | PRN

## 2024-02-22 MED ORDER — TRAMADOL HCL 50 MG PO TABS
50.0000 mg | ORAL_TABLET | Freq: Four times a day (QID) | ORAL | Status: DC
  Administered 2024-02-22 – 2024-02-24 (×7): 50 mg via ORAL
  Filled 2024-02-22 (×7): qty 1

## 2024-02-22 MED ORDER — CELECOXIB 100 MG PO CAPS
200.0000 mg | ORAL_CAPSULE | Freq: Two times a day (BID) | ORAL | Status: DC
  Administered 2024-02-22 – 2024-02-24 (×4): 200 mg via ORAL
  Filled 2024-02-22 (×4): qty 2

## 2024-02-22 MED ORDER — KETOROLAC TROMETHAMINE 15 MG/ML IJ SOLN
15.0000 mg | Freq: Once | INTRAMUSCULAR | Status: AC
  Administered 2024-02-22: 15 mg via INTRAVENOUS
  Filled 2024-02-22: qty 1

## 2024-02-22 MED ORDER — HYDROCODONE-ACETAMINOPHEN 5-325 MG PO TABS
1.0000 | ORAL_TABLET | ORAL | Status: DC | PRN
  Administered 2024-02-24: 2 via ORAL
  Filled 2024-02-22: qty 2

## 2024-02-22 MED ORDER — POVIDONE-IODINE 10 % EX SWAB
2.0000 | Freq: Once | CUTANEOUS | Status: AC
  Administered 2024-02-22: 2 via TOPICAL

## 2024-02-22 MED ORDER — ENOXAPARIN SODIUM 40 MG/0.4ML IJ SOSY
40.0000 mg | PREFILLED_SYRINGE | INTRAMUSCULAR | Status: DC
  Administered 2024-02-23 – 2024-02-24 (×2): 40 mg via SUBCUTANEOUS
  Filled 2024-02-22 (×2): qty 0.4

## 2024-02-22 SURGICAL SUPPLY — 45 items
BALL HIP ARTICU 28 +5 (Hips) IMPLANT
BIT DRILL 2.8X128 (BIT) ×2 IMPLANT
CHLORAPREP W/TINT 26 (MISCELLANEOUS) ×2 IMPLANT
CLOTH BEACON ORANGE TIMEOUT ST (SAFETY) ×2 IMPLANT
COUNTER NDL MAGNETIC 40 RED (SET/KITS/TRAYS/PACK) ×2 IMPLANT
COUNTER NEEDLE MAGNETIC 40 RED (SET/KITS/TRAYS/PACK) ×1 IMPLANT
COVER LIGHT HANDLE STERIS (MISCELLANEOUS) ×4 IMPLANT
DRAPE HIP W/POCKET STRL (MISCELLANEOUS) ×2 IMPLANT
DRAPE U-SHAPE 47X51 STRL (DRAPES) ×2 IMPLANT
DRSG MEPILEX POST OP 4X12 (GAUZE/BANDAGES/DRESSINGS) IMPLANT
DRSG MEPILEX SACRM 8.7X9.8 (GAUZE/BANDAGES/DRESSINGS) ×2 IMPLANT
ELECTRODE REM PT RTRN 9FT ADLT (ELECTROSURGICAL) ×2 IMPLANT
GLOVE BIOGEL PI IND STRL 7.0 (GLOVE) ×4 IMPLANT
GLOVE SS N UNI LF 8.5 STRL (GLOVE) ×2 IMPLANT
GLOVE SURG POLYISO LF SZ8 (GLOVE) ×4 IMPLANT
GOWN STRL REUS W/TWL LRG LVL3 (GOWN DISPOSABLE) ×4 IMPLANT
GOWN STRL REUS W/TWL XL LVL3 (GOWN DISPOSABLE) ×2 IMPLANT
HEAD BIPOLAR DEPUY 47 (Hips) IMPLANT
INST SET MAJOR BONE (KITS) ×2 IMPLANT
KIT TURNOVER KIT A (KITS) ×2 IMPLANT
MANIFOLD NEPTUNE II (INSTRUMENTS) ×2 IMPLANT
MARKER SKIN DUAL TIP RULER LAB (MISCELLANEOUS) ×2 IMPLANT
NDL HYPO 21X1.5 SAFETY (NEEDLE) ×2 IMPLANT
NDL MAYO 1/2 CRC TROCAR PT (NEEDLE) IMPLANT
NEEDLE HYPO 21X1.5 SAFETY (NEEDLE) ×1 IMPLANT
NEEDLE MAYO 1/2 CRC TROCAR PT (NEEDLE) ×1 IMPLANT
NS IRRIG 1000ML POUR BTL (IV SOLUTION) ×2 IMPLANT
PACK TOTAL JOINT (CUSTOM PROCEDURE TRAY) ×2 IMPLANT
PAD ARMBOARD POSITIONER FOAM (MISCELLANEOUS) ×2 IMPLANT
PASSER SUT SWANSON 36MM LOOP (INSTRUMENTS) IMPLANT
POSITIONER HEAD 8X9X4 ADT (SOFTGOODS) ×2 IMPLANT
SET BASIN LINEN APH (SET/KITS/TRAYS/PACK) ×2 IMPLANT
SET HNDPC FAN SPRY TIP SCT (DISPOSABLE) ×2 IMPLANT
STAPLER SKIN PROX 35W (STAPLE) IMPLANT
STEM FEMORAL SZ 6MM STD ACTIS (Stem) IMPLANT
SUT BRALON NAB BRD #1 30IN (SUTURE) ×4 IMPLANT
SUT ETHIBOND 5 LR DA (SUTURE) ×4 IMPLANT
SUT MNCRL 0 VIOLET CTX 36 (SUTURE) ×2 IMPLANT
SUT VIC AB 1 CT1 27XBRD ANTBC (SUTURE) ×8 IMPLANT
SYR 30ML LL (SYRINGE) ×2 IMPLANT
SYR BULB IRRIG 60ML STRL (SYRINGE) ×2 IMPLANT
TRAY FOLEY SLVR 16FR LF STAT (SET/KITS/TRAYS/PACK) IMPLANT
TUBE CONNECTING 12X1/4 (SUCTIONS) IMPLANT
WATER STERILE IRR 1000ML POUR (IV SOLUTION) ×4 IMPLANT
YANKAUER SUCT 12FT TUBE ARGYLE (SUCTIONS) ×2 IMPLANT

## 2024-02-22 NOTE — Plan of Care (Signed)
  Problem: Education: Goal: Knowledge of General Education information will improve Description: Including pain rating scale, medication(s)/side effects and non-pharmacologic comfort measures Outcome: Not Met (add Reason)   Problem: Health Behavior/Discharge Planning: Goal: Ability to manage health-related needs will improve Outcome: Not Met (add Reason)

## 2024-02-22 NOTE — Progress Notes (Signed)
  Progress Note   Patient: Tiffany Hanson LKG:[id number] DOB: 04/01/1946 DOA: 02/20/2024     2 DOS: the patient was seen and examined on 02/22/2024 at 8:30AM      Brief hospital course: 78 yo F with obesity, DM, HTN who presented with hip fracture with delayed presentation.  See H&P for further details.    Assessment and Plan: * Closed fracture of neck of left femur [facility]) S/p left hemiarthroplasty 4/25 by Dr. Phyllis Breeze Significant delay to presentation.  Orthopedics consulted.  US  ruled out DVT.  Aspiration of joint ruled out septic arthritis.  To the OR today.  - Post-op care per Orthopedics - PT/OT     Mixed hyperlipidemia - Continue simvastatin   Hypokalemia Supplemented  Hypertension BP low normal - Hold lisinopril   Diabetes (HCC) A1c 6.9%. Glucose well controlled - Continue SS corrections - Continue home Jardiance           Subjective: Doing well.  Pain controlled.  To the OR today.     Physical Exam: BP 92/63 (BP Location: Left Arm)   Pulse 66   Temp 97.8 F (36.6 C) (Oral)   Resp 18   Ht 5\' 5"  (1.651 m)   Wt 73.9 kg   SpO2 99%   BMI 27.12 kg/m   Female, sitting up in bed, interactive and appropriate RRR, no murmurs, no peripheral edema Respiratory normal, lungs clear without rales or wheezes Abdomen soft nontender to palpation or guarding, no ascites or distention   Data Reviewed: Lower extremity ultrasound reviewed, no clot Basic metabolic panel normal electrolytes and renal function CBC no anemia or leukocytosis   Family Communication: None present    Disposition: Status is: Inpatient The patient was admitted for hip fracture  After operative repair today, we will have her evaluated by physical therapy, then likely home with home health in a few days        Author: Ephriam Hashimoto, MD 02/22/2024 6:16 PM  For on call review [email or web address].

## 2024-02-22 NOTE — Care Management Important Message (Signed)
 Important Message  Patient Details  Name: Tiffany Hanson MRN: [id number] Date of Birth: 1945-12-15   Important Message Given:  Yes - Medicare IM     Ami Thornsberry L Toia Micale 02/22/2024, 11:01 AM

## 2024-02-22 NOTE — Brief Op Note (Signed)
 02/22/2024  3:05 PM  PATIENT:  Hilaria Loveless  78 y.o. female  PRE-OPERATIVE DIAGNOSIS:  left femoral neck fracture  POST-OPERATIVE DIAGNOSIS:  left femoral neck fracture  PROCEDURE:  Procedure(s): HEMIARTHROPLASTY (BIPOLAR) HIP, direct lateral  APPROACH FOR FRACTURE (Left)  SURGEON:  Surgeons and Role:    Darrin Emerald, MD - Primary  PHYSICIAN ASSISTANT:   ASSISTANTS: cynthia wrenn   ANESTHESIA:   spinal  EBL:  100 mL   BLOOD ADMINISTERED:none  DRAINS: none   LOCAL MEDICATIONS USED:  MARCAINE      SPECIMEN:  No Specimen  DISPOSITION OF SPECIMEN:  N/A  COUNTS:  YES  TOURNIQUET:  * No tourniquets in log *  DICTATION: .Dragon Dictation  PLAN OF CARE: Admit to inpatient   PATIENT DISPOSITION:  PACU - hemodynamically stable.   Delay start of Pharmacological VTE agent (>24hrs) due to surgical blood loss or risk of bleeding: yes

## 2024-02-22 NOTE — Anesthesia Postprocedure Evaluation (Signed)
 Anesthesia Post Note  Patient: Tiffany Hanson  Procedure(s) Performed: HEMIARTHROPLASTY (BIPOLAR) HIP, direct lateral  APPROACH FOR FRACTURE (Left: Hip)  Patient location during evaluation: Phase II Anesthesia Type: Spinal Level of consciousness: awake Pain management: pain level controlled Vital Signs Assessment: post-procedure vital signs reviewed and stable Respiratory status: spontaneous breathing and respiratory function stable Cardiovascular status: blood pressure returned to baseline and stable Postop Assessment: no headache and no apparent nausea or vomiting Anesthetic complications: no Comments: Late entry   No notable events documented.   Last Vitals:  Vitals:   02/22/24 1600 02/22/24 1624  BP: 100/62 92/63  Pulse: 66 66  Resp: 14 18  Temp: 36.4 C 36.6 C  SpO2: 99% 99%    Last Pain:  Vitals:   02/22/24 1624  TempSrc: Oral  PainSc:                  Coretha Dew

## 2024-02-22 NOTE — Anesthesia Procedure Notes (Signed)
 Spinal  Patient location during procedure: OR Start time: 02/22/2024 1:06 PM End time: 02/22/2024 1:08 PM Reason for block: surgical anesthesia Staffing Performed: resident/CRNA  Resident/CRNA: Alex Hylan, CRNA Performed by: Alex Hylan, CRNA Authorized by: Coretha Dew, MD   Preanesthetic Checklist Completed: patient identified, IV checked, site marked, risks and benefits discussed, surgical consent, monitors and equipment checked, pre-op evaluation and timeout performed Spinal Block Patient position: right lateral decubitus Prep: ChloraPrep Patient monitoring: heart rate, cardiac monitor, continuous pulse ox and blood pressure Approach: midline Location: L4-5 Injection technique: single-shot Needle Needle type: Pencan  Needle gauge: 22 G Assessment Events: CSF return

## 2024-02-22 NOTE — Interval H&P Note (Signed)
 History and Physical Interval Note:  02/22/2024 12:23 PM  Tiffany Hanson  has presented today for surgery, with the diagnosis of left femoral nexk fracture.  The various methods of treatment have been discussed with the patient and family. After consideration of risks, benefits and other options for treatment, the patient has consented to  Procedure(s): HEMIARTHROPLASTY (BIPOLAR) HIP, direct lateral  APPROACH FOR FRACTURE (Left) as a surgical intervention.  The patient's history has been reviewed, patient examined, no change in status, stable for surgery.  I have reviewed the patient's chart and labs.  Questions were answered to the patient's satisfaction.     Elsa Halls

## 2024-02-22 NOTE — Anesthesia Preprocedure Evaluation (Signed)
 Anesthesia Evaluation  Patient identified by MRN, date of birth, ID band Patient awake    Reviewed: Allergy & Precautions, H&P , NPO status , Patient's Chart, lab work & pertinent test results, reviewed documented beta blocker date and time   Airway Mallampati: II  TM Distance: >3 FB Neck ROM: full    Dental no notable dental hx.    Pulmonary neg pulmonary ROS   Pulmonary exam normal breath sounds clear to auscultation       Cardiovascular Exercise Tolerance: Good hypertension,  Rhythm:regular Rate:Normal     Neuro/Psych negative neurological ROS  negative psych ROS   GI/Hepatic Neg liver ROS,GERD  ,,  Endo/Other  diabetes    Renal/GU negative Renal ROS  negative genitourinary   Musculoskeletal   Abdominal   Peds  Hematology negative hematology ROS (+)   Anesthesia Other Findings   Reproductive/Obstetrics negative OB ROS                             Anesthesia Physical Anesthesia Plan  ASA: 3  Anesthesia Plan: Spinal   Post-op Pain Management:    Induction:   PONV Risk Score and Plan: Propofol infusion  Airway Management Planned:   Additional Equipment:   Intra-op Plan:   Post-operative Plan:   Informed Consent: I have reviewed the patients History and Physical, chart, labs and discussed the procedure including the risks, benefits and alternatives for the proposed anesthesia with the patient or authorized representative who has indicated his/her understanding and acceptance.     Dental Advisory Given  Plan Discussed with: CRNA  Anesthesia Plan Comments:        Anesthesia Quick Evaluation

## 2024-02-22 NOTE — Transfer of Care (Signed)
 Immediate Anesthesia Transfer of Care Note  Patient: Tiffany Hanson  Procedure(s) Performed: HEMIARTHROPLASTY (BIPOLAR) HIP, direct lateral  APPROACH FOR FRACTURE (Left: Hip)  Patient Location: PACU  Anesthesia Type:Spinal  Level of Consciousness: drowsy  Airway & Oxygen Therapy: Patient Spontanous Breathing and Patient connected to nasal cannula oxygen  Post-op Assessment: Report given to RN and Post -op Vital signs reviewed and stable  Post vital signs: Reviewed and stable  Last Vitals:  Vitals Value Taken Time  BP 105/69   Temp 97.8   Pulse 75   Resp 11   SpO2 100%     Last Pain:  Vitals:   02/22/24 1203  TempSrc: Oral  PainSc:          Complications: No notable events documented.

## 2024-02-22 NOTE — Op Note (Signed)
 Orthopaedic Surgery Operative Note (CSN: [id number])  Tiffany Hanson  [birth year] Date of Surgery: 02/22/2024   Diagnoses:  left femoral neck fracture  Procedure: Partial hip replacement left femur   TXA [used/not used]   Operative Finding Sub fracture left femoral neck acetabulum looked pretty good there was some scar tissue in it the margins of the fracture on the neck and head were blunted due to duration of the fracture   Post-Op Diagnosis: Same Surgeons:Primary: Darrin Emerald, MD Assistants: Augustus Blood Location: [facility] OR ROOM 4 Anesthesia:  spinal Antibiotics: ancef   Estimated Blood Loss: Less than 100 cc Complications: None correct Specimens: None correct   Implants: Implant Name Type Inv. Item Serial No. Manufacturer Lot No. LRB No. Used Action  STEM FEMORAL SZ STD ACTIS - [id number] Stem STEM FEMORAL SZ STD ACTIS  [employer] [id number] Left 1 Implanted  HEAD BIPOLAR DEPUY 47 - [id number] Hips HEAD BIPOLAR DEPUY 47  [employer] [id number] Left 1 Implanted  BALL HIP ARTICU 28 +5 - [id number] Hips BALL HIP ARTICU 28 +5  [employer] [id number] Left 1 Implanted     Indication for surgery left femoral neck hip fracture  Transexamic  acid was given yes   ASSISTANTS: Augustus Blood  ANESTHESIA:   Spinal  BLOOD ADMINISTERED: No blood  DRAINS: none   LOCAL MEDICATIONS USED: Marcaine  with epi   SPECIMEN:  No Specimen  DISPOSITION OF SPECIMEN:  N/A  COUNTS: correct  DICTATION: .Dragon Dictation   The patient was taken to the recovery room in stable condition  PLAN OF CARE: Routine  PATIENT DISPOSITION:  PACU - hemodynamically stable.   Delay start of Pharmacological VTE agent (>24hrs) due to surgical blood loss or risk of bleeding: Yes  Details of surgery: The patient was identified by 2 approved identification mechanisms. The operative extremity was evaluated and found to be acceptable for surgical treatment today. The chart  was reviewed. The surgical site was confirmed initials were placed left hip  The patient was taken to the operating room and given answer left as an antibiotic 2 g. This is consistent with the SCIP protocol.  Spinal anesthesia as stated Foley catheter insertion was completed  The patient was then placed in the lateral decubitus position with appropriate padding. The surgical site was prepped and draped sterilely.  Timeout was executed confirming the patient's name, surgical site, antibiotic administration, x-rays available, and implants were checked and were available.  Incision was made over the greater trochanter extended proximally and distally approximately 3 cm  Subcutaneous tissue was divided down to fascia which was then split in line with the skin incision and deep retractors were placed.  The greater trochanteric bursa was resected exposing the abductors   The gluteus medius anterior half was subperiosteally dissected from the greater trochanter and tagged with #1 Vicryl sutures  The underlying gluteus minimus was split in continuity with the capsule and preserved tagging with Vicryl sutures as well   2 Steinmann pins were then placed in the pelvis to retract the soft tissue.  Remaining anterior capsule was excised.   The femoral head was removed and measured 47  The acetabular was inspected: Scar tissue from the fracture again the cartilage looked good the margins of the fracture on the each side of the fracture were blunted because of the duration of the time since the incident  The hip was dislocated anteriorly into a sterile bag  Proximal femur was prepared starting with a femoral neck cutting  guide, box osteotome, and further preparation per manufacture technique  Broaching was started with a size 1 and broached up to appropriate size based on proximal fit and fill.  This was a size 6  Trial reduction was then performed using a 6 normal offset 47+5  Trial reduction  we found the hip to be stable   The trial implants were then removed 2 drill holes were placed in the greater trochanter and a #5 Ethibond was passed through the drill holes   The acetabulum was irrigated and cleaned of any bony debris, the  implants were placed and the hip was reduced  The hip was stable throughout the range of motion.    Hip flexion stable  Hip extension with external rotation stay full  Sleep position stress test stable full  Shuck test stable  Leg lengths normal  Local anesthetic Marcaine  with epi was injected in the soft tissues including the subcu tissue, the abductors were repaired using the #5 Ethibond and then oversewn with #1 Braylon  The hip was then abducted the fascia was closed with #1 Braylon  Subcutaneous tissues were closed with 0 Monocryl and  staples  Additional Marcaine  was injected in the subfascial layer  A sterile dressing was applied  The patient was taken recovery room in stable condition   Postop plan  Weightbearing as tolerated Direct lateral hip precautions DVT prophylaxis for 30 days Remove staples at 12 to 14 days Postop appointment scheduled for 28 days  [id number]

## 2024-02-23 DIAGNOSIS — E876 Hypokalemia: Secondary | ICD-10-CM | POA: Diagnosis not present

## 2024-02-23 DIAGNOSIS — E119 Type 2 diabetes mellitus without complications: Secondary | ICD-10-CM | POA: Diagnosis not present

## 2024-02-23 DIAGNOSIS — S72002D Fracture of unspecified part of neck of left femur, subsequent encounter for closed fracture with routine healing: Secondary | ICD-10-CM | POA: Diagnosis not present

## 2024-02-23 DIAGNOSIS — I10 Essential (primary) hypertension: Secondary | ICD-10-CM | POA: Diagnosis not present

## 2024-02-23 LAB — CBC
HCT: 39.7 % (ref 36.0–46.0)
Hemoglobin: 12.8 g/dL (ref 12.0–15.0)
MCH: 28.8 pg (ref 26.0–34.0)
MCHC: 32.2 g/dL (ref 30.0–36.0)
MCV: 89.2 fL (ref 80.0–100.0)
Platelets: 347 10*3/uL (ref 150–400)
RBC: 4.45 MIL/uL (ref 3.87–5.11)
RDW: 13.1 % (ref 11.5–15.5)
WBC: 14.2 10*3/uL — ABNORMAL HIGH (ref 4.0–10.5)
nRBC: 0 % (ref 0.0–0.2)

## 2024-02-23 LAB — COMPREHENSIVE METABOLIC PANEL WITH GFR
ALT: 16 U/L (ref 0–44)
AST: 28 U/L (ref 15–41)
Albumin: 3.2 g/dL — ABNORMAL LOW (ref 3.5–5.0)
Alkaline Phosphatase: 69 U/L (ref 38–126)
Anion gap: 10 (ref 5–15)
BUN: 10 mg/dL (ref 8–23)
CO2: 24 mmol/L (ref 22–32)
Calcium: 8.7 mg/dL — ABNORMAL LOW (ref 8.9–10.3)
Chloride: 102 mmol/L (ref 98–111)
Creatinine, Ser: 0.88 mg/dL (ref 0.44–1.00)
GFR, Estimated: >60 mL/min (ref >60)
Glucose, Bld: 129 mg/dL — ABNORMAL HIGH (ref 70–99)
Potassium: 3.7 mmol/L (ref 3.5–5.1)
Sodium: 136 mmol/L (ref 135–145)
Total Bilirubin: 0.8 mg/dL (ref 0.0–1.2)
Total Protein: 6.6 g/dL (ref 6.5–8.1)

## 2024-02-23 LAB — GLUCOSE, CAPILLARY
Glucose-Capillary: 146 mg/dL — ABNORMAL HIGH (ref 70–99)
Glucose-Capillary: 111 mg/dL — ABNORMAL HIGH (ref 70–99)
Glucose-Capillary: 113 mg/dL — ABNORMAL HIGH (ref 70–99)
Glucose-Capillary: 138 mg/dL — ABNORMAL HIGH (ref 70–99)

## 2024-02-23 NOTE — Evaluation (Addendum)
 Physical Therapy Evaluation Patient Details Name: Tiffany Hanson MRN: [id number] DOB: July 24, 1946 Today's Date: 02/23/2024  History of Present Illness  [name] fell going to the airport on her way to [place].  She was able to get up get into the transportation [employer] she walked through security and then down to the gate and started having pain she was therefore placed in a wheelchair and completed her [place] trip in the wheelchair not being able to participate in many activities.  She was complaining of left hip pain so when she got back to the [place]  she went to the chiropractor's office.  He took an x-ray but was unable to get an official reading until several days later.  Once he got the reading he told her to call an orthopedist to get an appointment which she did.  Her appointment was scheduled for April 28.  However in the interim she started having bilateral leg edema mainly in her feet and went to the emergency room to evaluate that.  In the process of evaluating that she was worked up for her left hip pain and was found to have a left hip fracture. Procedure(s):  HEMIARTHROPLASTY (BIPOLAR) HIP, direct lateral  APPROACH FOR FRACTURE (Left) as a surgical intervention.   Clinical Impression  Patient tolerated PT Evaluation very well. Patient reports at baseline, she is independent with all ADLs, iADLs, and mobility with any assistive equipment. Following the fall, she began using a RW for ambulation. On this date, patient is mod(I)/sup for all bed mobility,  functional transfers, and ambulation. Moderate pain t/o does not increase during mobility. Patient reports husband is retired and available for assist if needed 24/7 and reports already having RW. Would recommend shower seat for safety once home. Patient will benefit from continued skilled physical therapy acutely and in recommended venue in order to LE strength, endurance, and balance/coordination in order to return to PLOF and independence.         If plan is discharge home, recommend the following: A little help with walking and/or transfers;A little help with bathing/dressing/bathroom;Assistance with cooking/housework;Help with stairs or ramp for entrance   Can travel by private vehicle        Equipment Recommendations Other (comment) (Would recommend shower seat)  Recommendations for Other Services       Functional Status Assessment Patient has had a recent decline in their functional status and demonstrates the ability to make significant improvements in function in a reasonable and predictable amount of time.     Precautions / Restrictions Precautions Precautions: Other (comment) (Lateral hip) Recall of Precautions/Restrictions: Intact (Verbally expresses understanding) Restrictions Weight Bearing Restrictions Per Provider Order: Yes RLE Weight Bearing Per Provider Order: Weight bearing as tolerated Other Position/Activity Restrictions: Nurse contacting about adduction pillow      Mobility  Bed Mobility Overal bed mobility: Modified Independent     General bed mobility comments: HOB flat. Uses R bed railing durign supine>sit. V cueing only for movement pattern for lateral precautions    Transfers Overall transfer level: Modified independent Equipment used: Rolling walker (2 wheels) Transfers: Sit to/from Stand, Bed to chair/wheelchair/BSC Sit to Stand: Modified independent (Device/Increase time), Supervision   Step pivot transfers: Supervision, Modified independent (Device/Increase time)       General transfer comment: STS and bed<>chair transfer sup/mod(I) w/ RW. Inc time needed initially. V cues for shifting weight to R side and extending L knee during to avoid excessive pain, pt with good carryover.    Ambulation/Gait  Ambulation/Gait assistance: Modified independent (Device/Increase time), Contact guard assist, Supervision Gait Distance (Feet): 100 Feet Assistive device: Rolling walker (2  wheels) Gait Pattern/deviations: Step-to pattern, Decreased step length - right, Decreased stance time - left, Decreased stride length, Decreased weight shift to left Gait velocity: Decreased     General Gait Details: Pt steady t/o gait trial with RW, no LOB. CGA initially for safety progressing to mod(I).  Stairs    Wheelchair Mobility     Tilt Bed    Modified Rankin (Stroke Patients Only)       Balance Overall balance assessment: Needs assistance Sitting-balance support: No upper extremity supported, Feet supported Sitting balance-Leahy Scale: Good Sitting balance - Comments: Seated EOB, able to donn and doff R sock (I)   Standing balance support: Bilateral upper extremity supported, During functional activity, Reliant on assistive device for balance Standing balance-Leahy Scale: Good Standing balance comment: w/ RW during gait         Pertinent Vitals/Pain Pain Assessment Pain Assessment: 0-10 Pain Score: 5  Pain Location: L hip Pain Descriptors / Indicators: Aching Pain Intervention(s): Limited activity within patient's tolerance, Monitored during session, Repositioned    Home Living Family/patient expects to be discharged to:: Private residence Living Arrangements: Spouse/significant other Available Help at Discharge: Available 24 hours/day Type of Home: House Home Access: Stairs to enter Entrance Stairs-Rails: Left;Right (Cannot reach both at the same time) Entrance Stairs-Number of Steps: 3 in the back, 6 in the front   Home Layout: One level Home Equipment: Agricultural consultant (2 wheels);Crutches      Prior Function Prior Level of Function : Independent/Modified Independent     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 Not formally tested. Adequate for functional transfers and ambulation with RW LLE: Unable to fully assess due to pain LLE Coordination: decreased gross motor    Cervical / Trunk Assessment Cervical / Trunk Assessment: Normal  Communication   Communication Communication: No apparent difficulties    Cognition Arousal: Alert Behavior During Therapy: WFL for tasks assessed/performed   PT - Cognitive impairments: No apparent impairments     Following commands: Intact       Cueing Cueing Techniques: Verbal cues     General Comments      Exercises     Assessment/Plan    PT Assessment Patient needs continued PT services;All further PT needs can be met in the next venue of care  PT Problem List Decreased strength;Decreased range of motion;Decreased activity tolerance;Decreased balance;Decreased mobility;Pain       PT Treatment Interventions Gait training;DME instruction;Stair training;Functional mobility training;Therapeutic activities;Therapeutic exercise;Balance training;Patient/family education    PT Goals (Current goals can be found in the Care Plan section)  Acute Rehab PT Goals Patient Stated Goal: Return home PT Goal Formulation: With patient Time For Goal Achievement: 02/25/24 Potential to Achieve Goals: Good    Frequency Min 5X/week     Co-evaluation               AM-PAC PT "6 Clicks" Mobility  Outcome Measure Help needed turning from your back to your side while in a flat bed without using bedrails?: A  Little Help needed moving from lying on your back to sitting on the side of a flat bed without using bedrails?: A Little Help needed moving to and from a bed to a chair (including a wheelchair)?: A Little Help needed standing up from a chair using your arms  (e.g., wheelchair or bedside chair)?: A Little Help needed to walk in hospital room?: A Little Help needed climbing 3-5 steps with a railing? : A Lot 6 Click Score: 17    End of Session Equipment Utilized During Treatment: Gait belt Activity Tolerance: Patient tolerated treatment well Patient left: in chair;with call bell/phone within reach Nurse Communication: Mobility status PT Visit Diagnosis: Other abnormalities of gait and mobility (R26.89);Difficulty in walking, not elsewhere classified (R26.2);Pain;Muscle weakness (generalized) (M62.81) Pain - Right/Left: Left Pain - part of body: Hip    Time: 1610-9604 PT Time Calculation (min) (ACUTE ONLY): 24 min   Charges:   PT Evaluation $PT Eval Moderate Complexity: 1 Mod PT Treatments $Gait Training: 8-22 mins $Therapeutic Activity: 8-22 mins PT General Charges $$ ACUTE PT VISIT: 1 Visit         10:55 AM, 02/23/24 Marysue Sola, PT, DPT [place] with [facility]

## 2024-02-23 NOTE — Progress Notes (Signed)
   02/23/24 1713  TOC Discharge Assessment  Final next level of care Home w Home Health Services  Once discharged, how will the patient get to their discharge location? Family/Friend - Partnered Transport  Has discharge transport plan been identified? Yes  Barriers to Discharge Continued Medical Work up  Patient states their goals for this hospitalization and ongoing recovery are: Home with Home Health  CMS Medicare.gov Compare Post Acute Care list provided to: Patient  Choice offered to / list presented to  Patient  [facility] Arranged PT  [facility] Agency [facility] Care  Date [facility] Contacted 02/23/24  Time [facility] Agency Contacted [phone]  Representative spoke with at [facility]   Patient [facility] referral accepted by [facility].

## 2024-02-23 NOTE — Progress Notes (Signed)
 PROGRESS NOTE   Tiffany Hanson  NWG:[id number] DOB: 02/14/1946 DOA: 02/20/2024 PCP: Zella Hidalgo, PA-C   Chief Complaint  Patient presents with   Joint Swelling   Level of care: Med-Surg  Brief Admission History:  78 yo F with obesity, DM, HTN who presented with inability to walk and left hip discomfort.  Pt reportedly fell 2 weeks ago, at the airport boarding the shuttle from the longterm parking lot.  Proceeded on her vacation to [place], but was limited by leg weakness and confined to her room at the resort (only using a wheelchair to come down for meals).  On returning, she went to a chiropractor 1 week PTA, had an x-ray.  2 days ago, the x-ray was reportedly read, and she believes they told her to call an orthopedist for an appointment.  Because of the delay for the appointment, she came to the ER.    In the ER, radiograph showed displaced subcapital hip fracture. Case discussed with Dr. Phyllis Breeze, Orthopedics and was taken to OR for ORIF on 02/22/24.    Assessment and Plan:  Closed fracture of neck of left femur Postop s/p left hemiarthroplasty 02/22/24 by Dr. Phyllis Breeze - Post-op care per Orthopedics - PT/OT recommending home health services -- postop plan per Dr. Phyllis Breeze:  Weightbearing as tolerated Direct lateral hip precautions DVT prophylaxis for 30 days Remove staples at 12 to 14 days Postop appointment scheduled for 28 days  Mixed hyperlipidemia - Continue simvastatin  20 mg daily   Hypokalemia -- this has been repleted   Hypertension -- temporarily holding lisinopril  given soft BPs  Diabetes, type 2, controlled, non-insulin  requiring -- controlled as evidenced by A1c 6.9% - Continue SSI supplemental insulin  coverage and diet control with low carbs - Continue home empagliflozin  CBG (last 3)  Recent Labs    02/22/24 2009 02/23/24 0710 02/23/24 1152  GLUCAP 113* 146* 111*   DVT prophylaxis: enoxaparin Code Status: Full  Family Communication:   Disposition: anticipate home with [facility] tomorrow 4/27   Consultants:  Orthopedics Dr. Phyllis Breeze  Procedures:  Partial hip replacement left femur 02/22/24   Antimicrobials:    Subjective: Pt says she is sore with pain but otherwise she feels like she can work with PT today.   Objective: Vitals:   02/22/24 1927 02/22/24 2349 02/23/24 0403 02/23/24 1255  BP: 124/61 128/63 126/67 120/69  Pulse: 67 71 78 80  Resp: 14 14 14 16   Temp: 97.6 F (36.4 C) 98.4 F (36.9 C) 98.6 F (37 C) 98.3 F (36.8 C)  TempSrc: Oral Oral Oral Oral  SpO2: 97% 97% 96% 98%  Weight:      Height:        Intake/Output Summary (Last 24 hours) at 02/23/2024 1311 Last data filed at 02/23/2024 0830 Gross per 24 hour  Intake 1848.19 ml  Output 1375 ml  Net 473.19 ml   Filed Weights   02/20/24 1301 02/20/24 2036 02/22/24 1203  Weight: 84.4 kg 76.9 kg 73.9 kg   Examination:  General exam: Appears calm and comfortable  Respiratory system: Clear to auscultation. Respiratory effort normal. Cardiovascular system: normal S1 & S2 heard. No JVD, murmurs, rubs, gallops or clicks. No pedal edema. Gastrointestinal system: Abdomen is nondistended, soft and nontender. No organomegaly or masses felt. Normal bowel sounds heard. Central nervous system: Alert and oriented. No focal neurological deficits. Extremities: bandages c/d/I, warm extremities bilateral with palpable DP pulses bilateral. Skin: No rashes, lesions or ulcers. Psychiatry: Judgement and insight appear normal. Mood &  affect appropriate.   Data Reviewed: I have personally reviewed following labs and imaging studies  CBC: Recent Labs  Lab 02/20/24 1320 02/21/24 0415 02/22/24 0417 02/23/24 0435  WBC 9.0 9.4 10.0 14.2*  HGB 14.3 13.3 13.4 12.8  HCT 43.4 41.8 41.5 39.7  MCV 88.8 90.3 88.9 89.2  PLT 413* 373 388 347    Basic Metabolic Panel: Recent Labs  Lab 02/20/24 1320 02/21/24 0415 02/22/24 0417 02/23/24 0435  NA 139 140 139 136  K  3.4* 3.2* 3.7 3.7  CL 102 104 105 102  CO2 25 25 25 24   GLUCOSE 117* 105* 103* 129*  BUN 12 12 10 10   CREATININE 0.87 0.79 0.80 0.88  CALCIUM 9.3 8.9 9.0 8.7*  MG  --  2.2 2.2  --     CBG: Recent Labs  Lab 02/22/24 1632 02/22/24 1655 02/22/24 2009 02/23/24 0710 02/23/24 1152  GLUCAP 68* 74 113* 146* 111*    Recent Results (from the past 240 hours)  Surgical PCR screen     Status: None   Collection Time: 02/21/24 10:47 PM   Specimen: Nasal Mucosa; Nasal Swab  Result Value Ref Range Status   MRSA, PCR NEGATIVE NEGATIVE Final   Staphylococcus aureus NEGATIVE NEGATIVE Final    Comment: (NOTE) The Xpert SA Assay (FDA approved for NASAL specimens in patients 48 years of age and older), is one component of a comprehensive surveillance program. It is not intended to diagnose infection nor to guide or monitor treatment. Performed at [facility], [address], [place], [place] [address]   Body fluid culture w Gram Stain     Status: None (Preliminary result)   Collection Time: 02/22/24  8:50 AM   Specimen: Joint, Left Hip; Synovial Fluid  Result Value Ref Range Status   Specimen Description   Final    HIP Performed at [facility], [address], [place], [place] [address]    Special Requests   Final    LEFT Performed at [facility], [address], [place], [place] [address]    Gram Stain   Final    CYTOSPIN SMEAR NO ORGANISMS SEEN WBC PRESENT, PREDOMINANTLY PMN Performed at [facility], [address] [place], [place] [address]    Culture   Final    NO GROWTH < 24 HOURS Performed at [facility], [address], [place], [place] [address]    Report Status PENDING  Incomplete     Radiology Studies: DG FEMUR MIN 2 VIEWS LEFT Result Date: 02/22/2024 CLINICAL DATA:  Closed fracture.  Postoperative. EXAM: LEFT FEMUR 2 VIEWS COMPARISON:  Pelvis and left hip radiographs 02/20/2024 FINDINGS: Interval left hip hemiarthroplasty. No perihardware lucency is seen to  indicate hardware failure or loosening. Expected postoperative changes including lateral left hip and anterior and lateral left thigh subcutaneous air. Lateral left hip surgical skin staples. Individual suture anchors again overlie the right and left pubic bodies. Mild-to-moderate atherosclerotic of the mediolateral knee compartments. Mild chronic enthesopathic change at the quadriceps and patellar tendon insertions on the patella. No acute fracture or dislocation. IMPRESSION: Interval left hip hemiarthroplasty without evidence of hardware failure. Electronically Signed   By: Bertina Broccoli M.D.   On: 02/22/2024 18:24   DG FL ASP/INJ MAJOR (SHOULDER, HIP, KNEE) Result Date: 02/22/2024 INDICATION: Left hip fracture. Joint aspiration requested for rule out of infection prior to operative repair. EXAM: ARTHROCENTESIS/INJECTION OF LARGE JOINT COMPARISON:  DG HIP UNILAT W OR W/O PELVIS 2-3 VIEW CONTRAST:  None  FLUOROSCOPY TIME:  4.1 mGy COMPLICATIONS: None PROCEDURE: Informed written consent was obtained from the patient after discussion of the risks, benefits and alternatives to treatment. The patient was placed prone on the fluoroscopy table and the left extremity was placed in a slight degree of flexion and internal rotation. The left hip was localized with fluoroscopy. The skin overlying the anterior aspect of the hip was prepped and draped in usual sterile fashion. The overlying soft tissues were anesthetized with 1% lidocaine . A 20 gauge spinal needle was advanced into the fractured hip joint with 4mL of cloudy amber fluid removed. The syringe was capped and sent to the laboratory for analysis as ordered by the clinical team. The needle was removed and a dressing was placed. The patient tolerated procedure well without immediate postprocedural complication. IMPRESSION: Successful fluoroscopic guided aspiration of the left hip yielding 4mL fluid. Performed by: Kacie Matthews PA-C Electronically Signed   By: Myrlene Asper D.O.   On: 02/22/2024 10:44   Scheduled Meds:  acetaminophen   500 mg Oral Q6H   celecoxib   200 mg Oral BID   docusate sodium   100 mg Oral BID   docusate sodium   100 mg Oral BID   empagliflozin   10 mg Oral Daily   enoxaparin (LOVENOX) injection  40 mg Subcutaneous Q24H   feeding supplement  237 mL Oral BID BM   insulin  aspart  0-5 Units Subcutaneous QHS   insulin  aspart  0-9 Units Subcutaneous TID WC   multivitamin with minerals  1 tablet Oral Daily   pantoprazole   40 mg Oral Daily   simvastatin   20 mg Oral QPM   traMADol   50 mg Oral Q6H   Continuous Infusions:  sodium chloride  40 mL/hr at 02/22/24 1831    LOS: 3 days   Time spent: 54 mins  Kinta Martis Lincoln Renshaw, MD How to contact the [facility] Attending or Consulting provider 7A - 7P or covering provider during after hours 7P -7A, for this patient?  Check the care team in [facility] and look for a) attending/consulting TRH provider listed and b) the TRH team listed Log into [email or web address] to find provider on call.  Locate the TRH provider you are looking for under [facility] and page to a number that you can be directly reached. If you still have difficulty reaching the provider, please page the [facility] (Director on Call) for the Hospitalists listed on amion for assistance.  02/23/2024, 1:11 PM

## 2024-02-23 NOTE — Plan of Care (Signed)
  Problem: Acute Rehab PT Goals(only PT should resolve) Goal: Pt Will Go Supine/Side To Sit Outcome: Progressing Flowsheets (Taken 02/23/2024 1056) Pt will go Supine/Side to Sit: Independently Goal: Patient Will Transfer Sit To/From Stand Outcome: Progressing Flowsheets (Taken 02/23/2024 1056) Patient will transfer sit to/from stand: Independently Goal: Pt Will Transfer Bed To Chair/Chair To Bed Outcome: Progressing Flowsheets (Taken 02/23/2024 1056) Pt will Transfer Bed to Chair/Chair to Bed: with modified independence Goal: Pt Will Ambulate Outcome: Progressing Flowsheets (Taken 02/23/2024 1056) Pt will Ambulate:  > 125 feet  with supervision  with rolling walker Goal: Pt Will Go Up/Down Stairs Outcome: Progressing Flowsheets (Taken 02/23/2024 1056) Pt will Go Up / Down Stairs:  3-5 stairs  with contact guard assist  with rail(s)   10:57 AM, 02/23/24 Tiffany Hanson, PT, DPT [place] with [facility]

## 2024-02-23 NOTE — Plan of Care (Signed)
  Problem: Education: Goal: Knowledge of General Education information will improve Description: Including pain rating scale, medication(s)/side effects and non-pharmacologic comfort measures Outcome: Progressing   Problem: Health Behavior/Discharge Planning: Goal: Ability to manage health-related needs will improve Outcome: Progressing   Problem: Clinical Measurements: Goal: Ability to maintain clinical measurements within normal limits will improve Outcome: Progressing Goal: Will remain free from infection Outcome: Progressing Goal: Diagnostic test results will improve Outcome: Progressing Goal: Respiratory complications will improve Outcome: Progressing Goal: Cardiovascular complication will be avoided Outcome: Progressing   Problem: Activity: Goal: Risk for activity intolerance will decrease Outcome: Progressing   Problem: Nutrition: Goal: Adequate nutrition will be maintained Outcome: Progressing   Problem: Coping: Goal: Level of anxiety will decrease Outcome: Progressing   Problem: Elimination: Goal: Will not experience complications related to bowel motility Outcome: Progressing Goal: Will not experience complications related to urinary retention Outcome: Progressing   Problem: Pain Managment: Goal: General experience of comfort will improve and/or be controlled Outcome: Progressing   Problem: Safety: Goal: Ability to remain free from injury will improve Outcome: Progressing   Problem: Skin Integrity: Goal: Risk for impaired skin integrity will decrease Outcome: Progressing   Problem: Education: Goal: Ability to describe self-care measures that may prevent or decrease complications (Diabetes Survival Skills Education) will improve Outcome: Progressing Goal: Individualized Educational Video(s) Outcome: Progressing   Problem: Coping: Goal: Ability to adjust to condition or change in health will improve Outcome: Progressing   Problem: Fluid  Volume: Goal: Ability to maintain a balanced intake and output will improve Outcome: Progressing   Problem: Health Behavior/Discharge Planning: Goal: Ability to identify and utilize available resources and services will improve Outcome: Progressing Goal: Ability to manage health-related needs will improve Outcome: Progressing   Problem: Metabolic: Goal: Ability to maintain appropriate glucose levels will improve Outcome: Progressing   Problem: Nutritional: Goal: Maintenance of adequate nutrition will improve Outcome: Progressing Goal: Progress toward achieving an optimal weight will improve Outcome: Progressing   Problem: Skin Integrity: Goal: Risk for impaired skin integrity will decrease Outcome: Progressing   Problem: Tissue Perfusion: Goal: Adequacy of tissue perfusion will improve Outcome: Progressing   Problem: Education: Goal: Verbalization of understanding the information provided (i.e., activity precautions, restrictions, etc) will improve Outcome: Progressing Goal: Individualized Educational Video(s) Outcome: Progressing   Problem: Activity: Goal: Ability to ambulate and perform ADLs will improve Outcome: Progressing   Problem: Clinical Measurements: Goal: Postoperative complications will be avoided or minimized Outcome: Progressing   Problem: Self-Concept: Goal: Ability to maintain and perform role responsibilities to the fullest extent possible will improve Outcome: Progressing   Problem: Pain Management: Goal: Pain level will decrease Outcome: Progressing

## 2024-02-24 DIAGNOSIS — E782 Mixed hyperlipidemia: Secondary | ICD-10-CM | POA: Diagnosis not present

## 2024-02-24 DIAGNOSIS — I10 Essential (primary) hypertension: Secondary | ICD-10-CM | POA: Diagnosis not present

## 2024-02-24 DIAGNOSIS — E876 Hypokalemia: Secondary | ICD-10-CM | POA: Diagnosis not present

## 2024-02-24 DIAGNOSIS — S72002D Fracture of unspecified part of neck of left femur, subsequent encounter for closed fracture with routine healing: Secondary | ICD-10-CM | POA: Diagnosis not present

## 2024-02-24 LAB — CBC
HCT: 39.1 % (ref 36.0–46.0)
Hemoglobin: 12.8 g/dL (ref 12.0–15.0)
MCH: 29.2 pg (ref 26.0–34.0)
MCHC: 32.7 g/dL (ref 30.0–36.0)
MCV: 89.1 fL (ref 80.0–100.0)
Platelets: 348 10*3/uL (ref 150–400)
RBC: 4.39 MIL/uL (ref 3.87–5.11)
RDW: 13.3 % (ref 11.5–15.5)
WBC: 14.6 10*3/uL — ABNORMAL HIGH (ref 4.0–10.5)
nRBC: 0 % (ref 0.0–0.2)

## 2024-02-24 LAB — ANAEROBIC CULTURE W GRAM STAIN

## 2024-02-24 LAB — GLUCOSE, CAPILLARY
Glucose-Capillary: 107 mg/dL — ABNORMAL HIGH (ref 70–99)
Glucose-Capillary: 127 mg/dL — ABNORMAL HIGH (ref 70–99)

## 2024-02-24 MED ORDER — HYDROCODONE-ACETAMINOPHEN 5-325 MG PO TABS: 1.0000 | ORAL_TABLET | Freq: Four times a day (QID) | ORAL | 0 refills | Status: DC | PRN

## 2024-02-24 MED ORDER — POLYETHYLENE GLYCOL 3350 17 G PO PACK: 17.0000 g | PACK | Freq: Every day | ORAL | 0 refills | Status: AC | PRN

## 2024-02-24 MED ORDER — TRETINOIN 0.025 % EX CREA: 1.0000 | TOPICAL_CREAM | Freq: Every day | CUTANEOUS | Status: DC

## 2024-02-24 MED ORDER — ACETAMINOPHEN 325 MG PO TABS
325.0000 mg | ORAL_TABLET | Freq: Four times a day (QID) | ORAL | Status: AC | PRN
Start: 2024-02-24 — End: ?

## 2024-02-24 MED ORDER — APIXABAN 2.5 MG PO TABS: 2.5000 mg | ORAL_TABLET | Freq: Two times a day (BID) | ORAL | 0 refills | Status: AC

## 2024-02-24 NOTE — Plan of Care (Signed)
  Problem: Education: Goal: Knowledge of General Education information will improve Description: Including pain rating scale, medication(s)/side effects and non-pharmacologic comfort measures Outcome: Progressing   Problem: Health Behavior/Discharge Planning: Goal: Ability to manage health-related needs will improve Outcome: Progressing   Problem: Clinical Measurements: Goal: Ability to maintain clinical measurements within normal limits will improve Outcome: Progressing Goal: Will remain free from infection Outcome: Progressing Goal: Diagnostic test results will improve Outcome: Progressing Goal: Respiratory complications will improve Outcome: Progressing Goal: Cardiovascular complication will be avoided Outcome: Progressing   Problem: Activity: Goal: Risk for activity intolerance will decrease Outcome: Progressing   Problem: Nutrition: Goal: Adequate nutrition will be maintained Outcome: Progressing   Problem: Coping: Goal: Level of anxiety will decrease Outcome: Progressing   Problem: Elimination: Goal: Will not experience complications related to bowel motility Outcome: Progressing Goal: Will not experience complications related to urinary retention Outcome: Progressing   Problem: Pain Managment: Goal: General experience of comfort will improve and/or be controlled Outcome: Progressing   Problem: Safety: Goal: Ability to remain free from injury will improve Outcome: Progressing   Problem: Skin Integrity: Goal: Risk for impaired skin integrity will decrease Outcome: Progressing   Problem: Education: Goal: Ability to describe self-care measures that may prevent or decrease complications (Diabetes Survival Skills Education) will improve Outcome: Progressing Goal: Individualized Educational Video(s) Outcome: Progressing   Problem: Coping: Goal: Ability to adjust to condition or change in health will improve Outcome: Progressing   Problem: Fluid  Volume: Goal: Ability to maintain a balanced intake and output will improve Outcome: Progressing   Problem: Health Behavior/Discharge Planning: Goal: Ability to identify and utilize available resources and services will improve Outcome: Progressing Goal: Ability to manage health-related needs will improve Outcome: Progressing   Problem: Metabolic: Goal: Ability to maintain appropriate glucose levels will improve Outcome: Progressing   Problem: Nutritional: Goal: Maintenance of adequate nutrition will improve Outcome: Progressing Goal: Progress toward achieving an optimal weight will improve Outcome: Progressing   Problem: Skin Integrity: Goal: Risk for impaired skin integrity will decrease Outcome: Progressing   Problem: Tissue Perfusion: Goal: Adequacy of tissue perfusion will improve Outcome: Progressing   Problem: Education: Goal: Verbalization of understanding the information provided (i.e., activity precautions, restrictions, etc) will improve Outcome: Progressing Goal: Individualized Educational Video(s) Outcome: Progressing   Problem: Activity: Goal: Ability to ambulate and perform ADLs will improve Outcome: Progressing   Problem: Clinical Measurements: Goal: Postoperative complications will be avoided or minimized Outcome: Progressing   Problem: Self-Concept: Goal: Ability to maintain and perform role responsibilities to the fullest extent possible will improve Outcome: Progressing   Problem: Pain Management: Goal: Pain level will decrease Outcome: Progressing

## 2024-02-24 NOTE — Progress Notes (Signed)
 Physical Therapy Treatment Patient Details Name: Tiffany Hanson MRN: [id number] DOB: 1945/11/08 Today's Date: 02/24/2024   History of Present Illness Tiffany Hanson fell going to the airport on her way to [place].  She was able to get up get into the transportation [place] she walked through security and then down to the gate and started having pain she was therefore placed in a wheelchair and completed her [place] trip in the wheelchair not being able to participate in many activities.  She was complaining of left hip pain so when she got back to the [place]  she went to the chiropractor's office.  He took an x-ray but was unable to get an official reading until several days later.  Once he got the reading he told her to call an orthopedist to get an appointment which she did.  Her appointment was scheduled for April 28.  However in the interim she started having bilateral leg edema mainly in her feet and went to the emergency room to evaluate that.  In the process of evaluating that she was worked up for her left hip pain and was found to have a left hip fracture. Procedure(s):  HEMIARTHROPLASTY (BIPOLAR) HIP, direct lateral  APPROACH FOR FRACTURE (Left) as a surgical intervention.    PT Comments  Patient tolerated PT treatment well on this date. Continued progression towards goals as on this date, patient was mod(I) with bed mobility, using only railings to get supine<>sit and no assist for LLE handling. STS (from bed, toilet, and w/c) and ambulation mod(I) with use of RW. Pt steady t/o. Toilet transfer completed on this date mod(I), patient using grab bars and R/W. Pericare (I). Stair training/education completed, pt demo good carryover and safety awareness. CGA for safety t/o but no safety concerns at this time. Patient will benefit from continued skilled physical therapy acutely and in recommended venue in order to return to PLOF and max (I).     If plan is discharge home, recommend the following: A  little help with walking and/or transfers;A little help with bathing/dressing/bathroom;Assistance with cooking/housework;Help with stairs or ramp for entrance   Can travel by private vehicle        Equipment Recommendations  Other (comment) (Would recommend shower seat)    Recommendations for Other Services       Precautions / Restrictions Precautions Precautions: Other (comment) (Lateral hip) Recall of Precautions/Restrictions: Intact Restrictions Weight Bearing Restrictions Per Provider Order: Yes RLE Weight Bearing Per Provider Order: Weight bearing as tolerated     Mobility  Bed Mobility Overal bed mobility: Modified Independent             General bed mobility comments: HOB flat. Uses R bed railing during supine<>sit. Labored movementn and inc time needed 2.2 LLE weakness.    Transfers Overall transfer level: Modified independent Equipment used: Rolling walker (2 wheels), None Transfers: Sit to/from Stand Sit to Stand: Modified independent (Device/Increase time)           General transfer comment: STS w/ and w/o RW from bed. One STS from toilet mod(I) w/ use of RW and grab bar.  Pt steady t/o. Good carryover of STS educ from yesterday    Ambulation/Gait Ambulation/Gait assistance: Modified independent (Device/Increase time), Supervision Gait Distance (Feet): 200 Feet Assistive device: Rolling walker (2 wheels) Gait Pattern/deviations: Step-to pattern, Decreased step length - right, Decreased stance time - left, Decreased stride length Gait velocity: Decreased     General Gait Details: w/ RW, pt steady t/o, no  LOB. Supervision-mod(I). Inc weight shift to LLE   Stairs             Wheelchair Mobility     Tilt Bed    Modified Rankin (Stroke Patients Only)       Balance Overall balance assessment: Independent Sitting-balance support: No upper extremity supported, Feet supported Sitting balance-Leahy Scale: Good Sitting balance - Comments:  Seated EOB and on toilet. handles pericare (I)   Standing balance support: No upper extremity supported, During functional activity Standing balance-Leahy Scale: Good Standing balance comment: w/o RW, pt demo good standing balance while washing hands at sink                            Communication Communication Communication: No apparent difficulties  Cognition Arousal: Alert Behavior During Therapy: WFL for tasks assessed/performed   PT - Cognitive impairments: No apparent impairments                         Following commands: Intact      Cueing Cueing Techniques: Verbal cues  Exercises General Exercises - Lower Extremity Ankle Circles/Pumps: AROM, Left, Strengthening, 10 reps, Supine Quad Sets: AROM, Strengthening, Left, 10 reps, Supine Gluteal Sets: AROM, Strengthening, Left, 10 reps, Supine    General Comments        Pertinent Vitals/Pain Pain Assessment Pain Assessment: No/denies pain (Reports she has been keeping ice on it)    Home Living                          Prior Function            PT Goals (current goals can now be found in the care plan section) Acute Rehab PT Goals Patient Stated Goal: Return home PT Goal Formulation: With patient Time For Goal Achievement: 02/25/24 Potential to Achieve Goals: Good Progress towards PT goals: Progressing toward goals    Frequency    Min 5X/week      PT Plan      Co-evaluation              AM-PAC PT "6 Clicks" Mobility   Outcome Measure  Help needed turning from your back to your side while in a flat bed without using bedrails?: A Little Help needed moving from lying on your back to sitting on the side of a flat bed without using bedrails?: A Little Help needed moving to and from a bed to a chair (including a wheelchair)?: A Little Help needed standing up from a chair using your arms (e.g., wheelchair or bedside chair)?: A Little Help needed to walk in hospital  room?: A Little Help needed climbing 3-5 steps with a railing? : A Little 6 Click Score: 18    End of Session Equipment Utilized During Treatment: Gait belt Activity Tolerance: Patient tolerated treatment well Patient left: in chair;with call bell/phone within reach   PT Visit Diagnosis: Other abnormalities of gait and mobility (R26.89);Difficulty in walking, not elsewhere classified (R26.2);Pain;Muscle weakness (generalized) (M62.81) Pain - Right/Left: Left Pain - part of body: Hip     Time: 1610-9604 PT Time Calculation (min) (ACUTE ONLY): 17 min  Charges:    $Therapeutic Activity: 8-22 mins PT General Charges $$ ACUTE PT VISIT: 1 Visit                     11:35 AM, 02/24/24 Isom Kochan Powell-Butler, PT,  DPT [place] with [facility]

## 2024-02-24 NOTE — Discharge Summary (Signed)
 Physician Discharge Summary  Tiffany Hanson:[id number] DOB: 1946/08/06 DOA: 02/20/2024  PCP: Zella Hidalgo, PA-C Orthopedist: Dr. Phyllis Breeze  Admit date: 02/20/2024 Discharge date: 02/24/2024  Admitted From:  Home Disposition:  Home with [facility]   Recommendations for Outpatient Follow-up:  Follow up with PCP in 2 weeks Follow up with Dr. Phyllis Breeze in 2 weeks for staple removal Follow up with Dr Phyllis Breeze in 1 month for postop check, xrays Please remove staples in 2 weeks  Please obtain CBC in 2 weeks Pt is to take DVT prevention medication for 30 days total  Home Health:  PT   Discharge Condition: STABLE   CODE STATUS: FULL DIET: heart healthy foods recommended    Brief Hospitalization Summary: Please see all hospital notes, images, labs for full details of the hospitalization. Admission provider HPI:  78 yo F with obesity, DM, HTN who presented with inability to walk and left hip discomfort.  Pt reportedly fell 2 weeks ago, at the airport boarding the shuttle from the longterm parking lot.  Proceeded on her vacation to [place], but was limited by leg weakness and confined to her room at the resort (only using a wheelchair to come down for meals).  On returning, she went to a chiropractor 1 week PTA, had an x-ray.  2 days ago, the x-ray was reportedly read, and she believes they told her to call an orthopedist for an appointment.  Because of the delay for the appointment, she came to the ER.    In the ER, radiograph showed displaced subcapital hip fracture. Case discussed with Dr. Phyllis Breeze, Orthopedics and was taken to OR for ORIF on 02/22/24.   Hospital Course by problem list   Closed fracture of neck of left femur Postop s/p left hemiarthroplasty 02/22/24 by Dr. Phyllis Breeze - Post-op care per Orthopedics - PT/OT recommending home health services and patient is agreeable  -- postop plan per Dr. Phyllis Breeze:  Weightbearing as tolerated Direct lateral hip precautions DVT prophylaxis for  30 days (apixaban 2.5 mg BID ordered after discussing with pharm D) Remove staples at 12 to 14 days - Pt verbalized understanding of instructions Postop appointment scheduled for 28 days with Dr. Phyllis Breeze   Mixed hyperlipidemia - Continue simvastatin  20 mg daily    Hypokalemia -- this has been repleted    Hypertension -- temporarily holding lisinopril  given soft BPs - follow up with PCP   Diabetes, type 2, controlled, non-insulin  requiring -- controlled as evidenced by A1c 6.9% - Continue SSI supplemental insulin  coverage and diet control with low carbs - Continue home empagliflozin  CBG (last 3)  Recent Labs    02/23/24 2047 02/24/24 0722 02/24/24 1125  GLUCAP 138* 107* 127*   Discharge Diagnoses:  Principal Problem:   Closed fracture of neck of left femur (HCC) Active Problems:   Diabetes (HCC)   Hypertension   Hypokalemia   Mixed hyperlipidemia  Discharge Instructions:  Allergies as of 02/24/2024   Not on File      Medication List     STOP taking these medications    lisinopril  10 MG tablet Commonly known as: ZESTRIL        TAKE these medications    Accu-Chek FastClix Lancets Misc   acetaminophen  325 MG tablet Commonly known as: TYLENOL  Take 1-2 tablets (325-650 mg total) by mouth every 6 (six) hours as needed for mild pain (pain score 1-3) or moderate pain (pain score 4-6) (or temp > 100.5).   amLODipine 5 MG tablet Commonly known as: NORVASC Take  5 mg by mouth daily.   apixaban 2.5 MG Tabs tablet Commonly known as: ELIQUIS Take 1 tablet (2.5 mg total) by mouth 2 (two) times daily for 28 days. Start taking on: February 25, 2024   HYDROcodone -acetaminophen  5-325 MG tablet Commonly known as: NORCO/VICODIN Take 1 tablet by mouth every 6 (six) hours as needed for up to 3 days for severe pain (pain score 7-10).   Jardiance  10 MG Tabs tablet Generic drug: empagliflozin  Take 10 mg by mouth daily.   mometasone  0.1 % cream Commonly known as:  ELOCON  Apply to rash on back QD on Monday, Wednesday, Friday, and Saturday PRN.   montelukast  10 MG tablet Commonly known as: Singulair  Take 1 tablet (10 mg total) by mouth at bedtime.   ONE TOUCH ULTRA TEST test strip Generic drug: glucose blood   pantoprazole  40 MG tablet Commonly known as: PROTONIX  Take 40 mg by mouth 2 (two) times daily before a meal.   polyethylene glycol 17 g packet Commonly known as: MIRALAX  / GLYCOLAX  Take 17 g by mouth daily as needed for mild constipation.   simvastatin  20 MG tablet Commonly known as: ZOCOR  Take 20 mg by mouth every evening.   tretinoin  0.025 % cream Commonly known as: RETIN-A  Apply 1 Application topically at bedtime. Apply a pea sized amount to the entire face QHS.   Vitamin D -3 125 MCG (5000 UT) Tabs Take 1 tablet by mouth daily.               Durable Medical Equipment  (From admission, onward)           Start     Ordered   02/23/24 1141  For home use only DME 3 n 1  Once       Comments: Patient would benefit from 3 in 1 to decrease risk of falls in shower.  11:42 AM, 02/23/24 Marysue Sola, PT, DPT [place] with [facility]   02/23/24 1142            Follow-up Information     Care, [facility] Follow up.   Specialty: Home Health Services Contact information: [address] [place] [address] [phone]         Darrin Emerald, MD. Schedule an appointment as soon as possible for a visit in 2 week(s).   Specialties: Orthopedic Surgery, Radiology Why: for Staple Removal Contact information: [address] [place] [address] [phone]         Darrin Emerald, MD. Schedule an appointment as soon as possible for a visit in 1 month(s).   Specialties: Orthopedic Surgery, Radiology Why: Postop Visit, Hospital Follow Up Contact information: [address] [place] [address] [phone]         Zella Hidalgo, PA-C.  Schedule an appointment as soon as possible for a visit in 2 week(s).   Specialties: Physician Assistant, Internal Medicine Why: Hospital Follow Up Contact information: [address] [place] [address] [phone]                Not on File Allergies as of 02/24/2024   Not on File      Medication List     STOP taking these medications    lisinopril  10 MG tablet Commonly known as: ZESTRIL        TAKE these medications    Accu-Chek FastClix Lancets Misc   acetaminophen  325 MG tablet Commonly known as: TYLENOL  Take 1-2 tablets (325-650 mg  total) by mouth every 6 (six) hours as needed for mild pain (pain score 1-3) or moderate pain (pain score 4-6) (or temp > 100.5).   amLODipine 5 MG tablet Commonly known as: NORVASC Take 5 mg by mouth daily.   apixaban 2.5 MG Tabs tablet Commonly known as: ELIQUIS Take 1 tablet (2.5 mg total) by mouth 2 (two) times daily for 28 days. Start taking on: February 25, 2024   HYDROcodone -acetaminophen  5-325 MG tablet Commonly known as: NORCO/VICODIN Take 1 tablet by mouth every 6 (six) hours as needed for up to 3 days for severe pain (pain score 7-10).   Jardiance  10 MG Tabs tablet Generic drug: empagliflozin  Take 10 mg by mouth daily.   mometasone  0.1 % cream Commonly known as: ELOCON  Apply to rash on back QD on Monday, Wednesday, Friday, and Saturday PRN.   montelukast  10 MG tablet Commonly known as: Singulair  Take 1 tablet (10 mg total) by mouth at bedtime.   ONE TOUCH ULTRA TEST test strip Generic drug: glucose blood   pantoprazole  40 MG tablet Commonly known as: PROTONIX  Take 40 mg by mouth 2 (two) times daily before a meal.   polyethylene glycol 17 g packet Commonly known as: MIRALAX  / GLYCOLAX  Take 17 g by mouth daily as needed for mild constipation.   simvastatin  20 MG tablet Commonly known as: ZOCOR  Take 20 mg by mouth every evening.   tretinoin  0.025 % cream Commonly known as:  RETIN-A  Apply 1 Application topically at bedtime. Apply a pea sized amount to the entire face QHS.   Vitamin D -3 125 MCG (5000 UT) Tabs Take 1 tablet by mouth daily.               Durable Medical Equipment  (From admission, onward)           Start     Ordered   02/23/24 1141  For home use only DME 3 n 1  Once       Comments: Patient would benefit from 3 in 1 to decrease risk of falls in shower.  11:42 AM, 02/23/24 Marysue Sola, PT, DPT [place] with [facility]   02/23/24 1142            Procedures/Studies: DG FEMUR MIN 2 VIEWS LEFT Result Date: 02/22/2024 CLINICAL DATA:  Closed fracture.  Postoperative. EXAM: LEFT FEMUR 2 VIEWS COMPARISON:  Pelvis and left hip radiographs 02/20/2024 FINDINGS: Interval left hip hemiarthroplasty. No perihardware lucency is seen to indicate hardware failure or loosening. Expected postoperative changes including lateral left hip and anterior and lateral left thigh subcutaneous air. Lateral left hip surgical skin staples. Individual suture anchors again overlie the right and left pubic bodies. Mild-to-moderate atherosclerotic of the mediolateral knee compartments. Mild chronic enthesopathic change at the quadriceps and patellar tendon insertions on the patella. No acute fracture or dislocation. IMPRESSION: Interval left hip hemiarthroplasty without evidence of hardware failure. Electronically Signed   By: Bertina Broccoli M.D.   On: 02/22/2024 18:24   DG FL ASP/INJ MAJOR (SHOULDER, HIP, KNEE) Result Date: 02/22/2024 INDICATION: Left hip fracture. Joint aspiration requested for rule out of infection prior to operative repair. EXAM: ARTHROCENTESIS/INJECTION OF LARGE JOINT COMPARISON:  DG HIP UNILAT W OR W/O PELVIS 2-3 VIEW CONTRAST:  None FLUOROSCOPY TIME:  4.1 mGy COMPLICATIONS: None PROCEDURE: Informed written consent was obtained from the patient after discussion of the risks, benefits and alternatives to treatment. The patient  was placed prone on the fluoroscopy table and the left extremity was placed  in a slight degree of flexion and internal rotation. The left hip was localized with fluoroscopy. The skin overlying the anterior aspect of the hip was prepped and draped in usual sterile fashion. The overlying soft tissues were anesthetized with 1% lidocaine . A 20 gauge spinal needle was advanced into the fractured hip joint with 4mL of cloudy amber fluid removed. The syringe was capped and sent to the laboratory for analysis as ordered by the clinical team. The needle was removed and a dressing was placed. The patient tolerated procedure well without immediate postprocedural complication. IMPRESSION: Successful fluoroscopic guided aspiration of the left hip yielding 4mL fluid. Performed by: Kacie Matthews PA-C Electronically Signed   By: Myrlene Asper D.O.   On: 02/22/2024 10:44   US  Venous Img Lower Bilateral (DVT) Result Date: 02/21/2024 CLINICAL DATA:  Fall 2 weeks ago with hip fracture. Leg swelling and weakness. Pain EXAM: BILATERAL LOWER EXTREMITY VENOUS DOPPLER ULTRASOUND TECHNIQUE: Gray-scale sonography with graded compression, as well as color Doppler and duplex ultrasound were performed to evaluate the lower extremity deep venous systems from the level of the common femoral vein and including the common femoral, femoral, profunda femoral, popliteal and calf veins including the posterior tibial, peroneal and gastrocnemius veins when visible. The superficial great saphenous vein was also interrogated. Spectral Doppler was utilized to evaluate flow at rest and with distal augmentation maneuvers in the common femoral, femoral and popliteal veins. COMPARISON:  None Available. FINDINGS: RIGHT LOWER EXTREMITY Common Femoral Vein: No evidence of thrombus. Normal compressibility, respiratory phasicity and response to augmentation. Saphenofemoral Junction: No evidence of thrombus. Normal compressibility and flow on color Doppler  imaging. Profunda Femoral Vein: No evidence of thrombus. Normal compressibility and flow on color Doppler imaging. Femoral Vein: No evidence of thrombus. Normal compressibility, respiratory phasicity and response to augmentation. Popliteal Vein: No evidence of thrombus. Normal compressibility, respiratory phasicity and response to augmentation. Calf Veins: No evidence of thrombus. Normal compressibility and flow on color Doppler imaging. Superficial Great Saphenous Vein: No evidence of thrombus. Normal compressibility. Venous Reflux:  None. Other Findings:  None. LEFT LOWER EXTREMITY Common Femoral Vein: No evidence of thrombus. Normal compressibility, respiratory phasicity and response to augmentation. Saphenofemoral Junction: No evidence of thrombus. Normal compressibility and flow on color Doppler imaging. Profunda Femoral Vein: No evidence of thrombus. Normal compressibility and flow on color Doppler imaging. Femoral Vein: No evidence of thrombus. Normal compressibility, respiratory phasicity and response to augmentation. Popliteal Vein: Not well seen with overlapping soft tissue and patient positioning. Calf Veins: No evidence of thrombus. Normal compressibility and flow on color Doppler imaging. Superficial Great Saphenous Vein: No evidence of thrombus. Normal compressibility. Venous Reflux:  None. Other Findings:  None. IMPRESSION: No evidence of bilateral lower extremity DVT. Limited evaluation of the left popliteal vein with patient positioning. Thrombus is not excluded in this location. Electronically Signed   By: Adrianna Horde M.D.   On: 02/21/2024 15:11   DG Chest 2 View Result Date: 02/20/2024 CLINICAL DATA:  fall 2 weeks ago EXAM: CHEST - 2 VIEW COMPARISON:  Chest x-ray 04/07/2023. CT chest 09/07/2008 FINDINGS: The heart and mediastinal contours are within normal limits. No focal consolidation. No pulmonary edema. No pleural effusion. No pneumothorax. No acute osseous abnormality. Multilevel  degenerative change of the spine. Chronic appearing midthoracic vertebral body height loss. IMPRESSION: No active cardiopulmonary disease. Electronically Signed   By: Morgane  Naveau M.D.   On: 02/20/2024 17:37   DG Hip Unilat W or Wo Pelvis 2-3 Views Left  Result Date: 02/20/2024 CLINICAL DATA:  fall 2 weeks ago EXAM: DG HIP (WITH OR WITHOUT PELVIS) 2-3V LEFT COMPARISON:  None Available. FINDINGS: Acute superiorly displaced subcapital left femoral neck fracture. No left hip dislocation. No acute displaced fracture or dislocation of the right hip on frontal view. No acute displaced fracture or diastasis of the bones of the pelvis. There is no evidence of severe arthropathy or aggressive appearing focal bone abnormality of the bones of the pelvis or hips. Anchor suture devices noted along the pubic symphysis bilaterally. Degenerative changes of the visualized lower lumbar spine. IMPRESSION: Acute superiorly displaced subcapital left femoral neck fracture. Electronically Signed   By: Morgane  Naveau M.D.   On: 02/20/2024 17:36     Subjective: Pt is eager to go home today, she has been sitting in chair and ambulating in room.  She is agreeable to the recommended home health and she will follow up with Dr. Phyllis Breeze and understands that she needs to have staples removed in 2 weeks.    Discharge Exam: Vitals:   02/23/24 1951 02/24/24 0357  BP: (!) 135/55 138/62  Pulse: 79 83  Resp: 14 14  Temp: 98.3 F (36.8 C) 98 F (36.7 C)  SpO2: 97% 97%   Vitals:   02/23/24 0403 02/23/24 1255 02/23/24 1951 02/24/24 0357  BP: 126/67 120/69 (!) 135/55 138/62  Pulse: 78 80 79 83  Resp: 14 16 14 14   Temp: 98.6 F (37 C) 98.3 F (36.8 C) 98.3 F (36.8 C) 98 F (36.7 C)  TempSrc: Oral Oral Oral Oral  SpO2: 96% 98% 97% 97%  Weight:      Height:       General: Pt is alert, awake, not in acute distress Cardiovascular: RRR, S1/S2 +, no rubs, no gallops Respiratory: CTA bilaterally, no wheezing, no  rhonchi Abdominal: Soft, NT, ND, bowel sounds + Extremities: bandages clean and dry no s/s of infection Neurological: nonfocal exam    The results of significant diagnostics from this hospitalization (including imaging, microbiology, ancillary and laboratory) are listed below for reference.     Microbiology: Recent Results (from the past 240 hours)  Surgical PCR screen     Status: None   Collection Time: 02/21/24 10:47 PM   Specimen: Nasal Mucosa; Nasal Swab  Result Value Ref Range Status   MRSA, PCR NEGATIVE NEGATIVE Final   Staphylococcus aureus NEGATIVE NEGATIVE Final    Comment: (NOTE) The Xpert SA Assay (FDA approved for NASAL specimens in patients 44 years of age and older), is one component of a comprehensive surveillance program. It is not intended to diagnose infection nor to guide or monitor treatment. Performed at [facility], [address], [place], [place] [address]   Culture, Fungus without Smear     Status: None (Preliminary result)   Collection Time: 02/22/24  8:50 AM   Specimen: Joint, Left Hip; Synovial Fluid  Result Value Ref Range Status   Specimen Description   Final    HIP Performed at [facility], [address] [place], [place] [address]    Special Requests   Final    LEFT Performed at [facility], [address] [place], [place] [address]    Culture   Final    NO FUNGUS ISOLATED AFTER 2 DAYS Performed at [facility], [address], [place], [place] [address]    Report Status PENDING  Incomplete  Body fluid culture w Gram Stain     Status: None (Preliminary result)   Collection  Time: 02/22/24  8:50 AM   Specimen: Joint, Left Hip; Synovial Fluid  Result Value Ref Range Status   Specimen Description   Final    HIP Performed at [facility], [address], [place], [place] [address]    Special Requests   Final    LEFT Performed at [facility], [address], [place], [place] [address]    Gram Stain   Final    CYTOSPIN SMEAR NO  ORGANISMS SEEN WBC PRESENT, PREDOMINANTLY PMN Performed at [facility], [address] [place], [place] [address]    Culture   Final    NO GROWTH < 24 HOURS Performed at [facility], [address] [place], [place] [address]    Report Status PENDING  Incomplete     Labs: BNP (last 3 results) Recent Labs    02/20/24 1320  BNP 18.0   Basic Metabolic Panel: Recent Labs  Lab 02/20/24 1320 02/21/24 0415 02/22/24 0417 02/23/24 0435  NA 139 140 139 136  K 3.4* 3.2* 3.7 3.7  CL 102 104 105 102  CO2 25 25 25 24   GLUCOSE 117* 105* 103* 129*  BUN 12 12 10 10   CREATININE 0.87 0.79 0.80 0.88  CALCIUM 9.3 8.9 9.0 8.7*  MG  --  2.2 2.2  --    Liver Function Tests: Recent Labs  Lab 02/23/24 0435  AST 28  ALT 16  ALKPHOS 69  BILITOT 0.8  PROT 6.6  ALBUMIN 3.2*   No results for input(s): "LIPASE", "AMYLASE" in the last 168 hours. No results for input(s): "AMMONIA" in the last 168 hours. CBC: Recent Labs  Lab 02/20/24 1320 02/21/24 0415 02/22/24 0417 02/23/24 0435 02/24/24 0431  WBC 9.0 9.4 10.0 14.2* 14.6*  HGB 14.3 13.3 13.4 12.8 12.8  HCT 43.4 41.8 41.5 39.7 39.1  MCV 88.8 90.3 88.9 89.2 89.1  PLT 413* 373 388 347 348   Cardiac Enzymes: No results for input(s): "CKTOTAL", "CKMB", "CKMBINDEX", "TROPONINI" in the last 168 hours. BNP: Invalid input(s): "POCBNP" CBG: Recent Labs  Lab 02/23/24 1152 02/23/24 1607 02/23/24 2047 02/24/24 0722 02/24/24 1125  GLUCAP 111* 113* 138* 107* 127*   D-Dimer No results for input(s): "DDIMER" in the last 72 hours. Hgb A1c No results for input(s): "HGBA1C" in the last 72 hours. Lipid Profile No results for input(s): "CHOL", "HDL", "LDLCALC", "TRIG", "CHOLHDL", "LDLDIRECT" in the last 72 hours. Thyroid  function studies No results for input(s): "TSH", "T4TOTAL", "T3FREE", "THYROIDAB" in the last 72 hours.  Invalid input(s): "FREET3" Anemia work up No results for input(s): "VITAMINB12", "FOLATE", "FERRITIN",  "TIBC", "IRON", "RETICCTPCT" in the last 72 hours. Urinalysis No results found for: "COLORURINE", "APPEARANCEUR", "LABSPEC", "PHURINE", "GLUCOSEU", "HGBUR", "BILIRUBINUR", "KETONESUR", "PROTEINUR", "UROBILINOGEN", "NITRITE", "LEUKOCYTESUR" Sepsis Labs Recent Labs  Lab 02/21/24 0415 02/22/24 0417 02/23/24 0435 02/24/24 0431  WBC 9.4 10.0 14.2* 14.6*   Microbiology Recent Results (from the past 240 hours)  Surgical PCR screen     Status: None   Collection Time: 02/21/24 10:47 PM   Specimen: Nasal Mucosa; Nasal Swab  Result Value Ref Range Status   MRSA, PCR NEGATIVE NEGATIVE Final   Staphylococcus aureus NEGATIVE NEGATIVE Final    Comment: (NOTE) The Xpert SA Assay (FDA approved for NASAL specimens in patients 42 years of age and older), is one component of a comprehensive surveillance program. It is not intended to diagnose infection nor to guide or monitor treatment. Performed at [facility], [address], [place], [place] [address]   Culture,  Fungus without Smear     Status: None (Preliminary result)   Collection Time: 02/22/24  8:50 AM   Specimen: Joint, Left Hip; Synovial Fluid  Result Value Ref Range Status   Specimen Description   Final    HIP Performed at [facility], [address] [place], [place] [address]    Special Requests   Final    LEFT Performed at [facility], [address] [place], [place] [address]    Culture   Final    NO FUNGUS ISOLATED AFTER 2 DAYS Performed at [facility], [address], [place], [place] [address]    Report Status PENDING  Incomplete  Body fluid culture w Gram Stain     Status: None (Preliminary result)   Collection Time: 02/22/24  8:50 AM   Specimen: Joint, Left Hip; Synovial Fluid  Result Value Ref Range Status   Specimen Description   Final    HIP Performed at [facility], [address], [place], [place] [address]    Special Requests   Final    LEFT Performed at [facility], [address], [place],  [place] [address]    Gram Stain   Final    CYTOSPIN SMEAR NO ORGANISMS SEEN WBC PRESENT, PREDOMINANTLY PMN Performed at [facility], [address] [place], [place] [address]    Culture   Final    NO GROWTH < 24 HOURS Performed at [facility], [address], [place], [place] [address]    Report Status PENDING  Incomplete   Time coordinating discharge: 46 mins   SIGNED:  Faustino Hook, MD  [facility] 02/24/2024, 12:33 PM How to contact the [facility] Attending or Consulting provider 7A - 7P or covering provider during after hours 7P -7A, for this patient?  Check the care team in [facility] and look for a) attending/consulting [facility] provider listed and b) the [facility] team listed Log into [email or web address] and use [place]'s universal password to access. If you do not have the password, please contact the hospital operator. Locate the [facility] provider you are looking for under [facility] and page to a number that you can be directly reached. If you still have difficulty reaching the provider, please page the [facility] (Director on Call) for the Hospitalists listed on amion for assistance.

## 2024-02-24 NOTE — Discharge Instructions (Addendum)
 PLEASE HAVE STAPLES REMOVED IN 2 WEEKS AROUND 03/07/24   Postop plan per Dr. Phyllis Breeze: Weightbearing as tolerated Direct lateral hip precautions DVT prophylaxis for 30 days (apixaban tablets) Remove staples at 12 to 14 days (around 03/07/24) Postop appointment with Dr. Phyllis Breeze scheduled for 28 days   IMPORTANT INFORMATION: PAY CLOSE ATTENTION   PHYSICIAN DISCHARGE INSTRUCTIONS  Follow with Primary care provider  Zella Hidalgo, PA-C  and other consultants as instructed by your Hospitalist Physician  SEEK MEDICAL CARE OR RETURN TO EMERGENCY ROOM IF SYMPTOMS COME BACK, WORSEN OR NEW PROBLEM DEVELOPS   Please note: You were cared for by a hospitalist during your hospital stay. Every effort will be made to forward records to your primary care provider.  You can request that your primary care provider send for your hospital records if they have not received them.  Once you are discharged, your primary care physician will handle any further medical issues. Please note that NO REFILLS for any discharge medications will be authorized once you are discharged, as it is imperative that you return to your primary care physician (or establish a relationship with a primary care physician if you do not have one) for your post hospital discharge needs so that they can reassess your need for medications and monitor your lab values.  Please get a complete blood count and chemistry panel checked by your Primary MD at your next visit, and again as instructed by your Primary MD.  Get Medicines reviewed and adjusted: Please take all your medications with you for your next visit with your Primary MD  Laboratory/radiological data: Please request your Primary MD to go over all hospital tests and procedure/radiological results at the follow up, please ask your primary care provider to get all Hospital records sent to his/her office.  In some cases, they will be blood work, cultures and biopsy results pending at  the time of your discharge. Please request that your primary care provider follow up on these results.  If you are diabetic, please bring your blood sugar readings with you to your follow up appointment with primary care.    Please call and make your follow up appointments as soon as possible.    Also Note the following: If you experience worsening of your admission symptoms, develop shortness of breath, life threatening emergency, suicidal or homicidal thoughts you must seek medical attention immediately by calling 911 or calling your MD immediately  if symptoms less severe.  You must read complete instructions/literature along with all the possible adverse reactions/side effects for all the Medicines you take and that have been prescribed to you. Take any new Medicines after you have completely understood and accpet all the possible adverse reactions/side effects.   Do not drive when taking Pain medications or sleeping medications (Benzodiazepines)  Do not take more than prescribed Pain, Sleep and Anxiety Medications. It is not advisable to combine anxiety,sleep and pain medications without talking with your primary care practitioner  Special Instructions: If you have smoked or chewed Tobacco  in the last 2 yrs please stop smoking, stop any regular Alcohol  and or any Recreational drug use.  Wear Seat belts while driving.  Do not drive if taking any narcotic, mind altering or controlled substances or recreational drugs or alcohol.

## 2024-02-24 NOTE — Progress Notes (Signed)
 Nsg Discharge Note  Admit Date:  02/20/2024 Discharge date: 02/24/2024   [facility] to be D/C'd Home per MD order.  AVS completed.   Patient/caregiver able to verbalize understanding.  Discharge Medication: Allergies as of 02/24/2024   Not on File      Medication List     STOP taking these medications    lisinopril  10 MG tablet Commonly known as: ZESTRIL        TAKE these medications    Accu-Chek FastClix Lancets Misc   acetaminophen  325 MG tablet Commonly known as: TYLENOL  Take 1-2 tablets (325-650 mg total) by mouth every 6 (six) hours as needed for mild pain (pain score 1-3) or moderate pain (pain score 4-6) (or temp > 100.5).   amLODipine 5 MG tablet Commonly known as: NORVASC Take 5 mg by mouth daily.   apixaban 2.5 MG Tabs tablet Commonly known as: ELIQUIS Take 1 tablet (2.5 mg total) by mouth 2 (two) times daily for 28 days. Start taking on: February 25, 2024   HYDROcodone -acetaminophen  5-325 MG tablet Commonly known as: NORCO/VICODIN Take 1 tablet by mouth every 6 (six) hours as needed for up to 3 days for severe pain (pain score 7-10).   Jardiance  10 MG Tabs tablet Generic drug: empagliflozin  Take 10 mg by mouth daily.   mometasone  0.1 % cream Commonly known as: ELOCON  Apply to rash on back QD on Monday, Wednesday, Friday, and Saturday PRN.   montelukast  10 MG tablet Commonly known as: Singulair  Take 1 tablet (10 mg total) by mouth at bedtime.   ONE TOUCH ULTRA TEST test strip Generic drug: glucose blood   pantoprazole  40 MG tablet Commonly known as: PROTONIX  Take 40 mg by mouth 2 (two) times daily before a meal.   polyethylene glycol 17 g packet Commonly known as: MIRALAX  / GLYCOLAX  Take 17 g by mouth daily as needed for mild constipation.   simvastatin  20 MG tablet Commonly known as: ZOCOR  Take 20 mg by mouth every evening.   tretinoin  0.025 % cream Commonly known as: RETIN-A  Apply 1 Application topically at bedtime. Apply a pea  sized amount to the entire face QHS.   Vitamin D -3 125 MCG (5000 UT) Tabs Take 1 tablet by mouth daily.               Durable Medical Equipment  (From admission, onward)           Start     Ordered   02/23/24 1141  For home use only DME 3 n 1  Once       Comments: Patient would benefit from 3 in 1 to decrease risk of falls in shower.  11:42 AM, 02/23/24 Marysue Sola, PT, DPT [place] with [facility]   02/23/24 1142            Discharge Assessment: Vitals:   02/23/24 1951 02/24/24 0357  BP: (!) 135/55 138/62  Pulse: 79 83  Resp: 14 14  Temp: 98.3 F (36.8 C) 98 F (36.7 C)  SpO2: 97% 97%   Skin clean, dry and intact without evidence of skin break down, no evidence of skin tears noted. IV catheter discontinued intact. Site without signs and symptoms of complications - no redness or edema noted at insertion site, patient denies c/o pain - only slight tenderness at site.  Dressing with slight pressure applied.  D/c Instructions-Education: Discharge instructions given to patient/family with verbalized understanding. D/c education completed with patient/family including follow up instructions, medication list, d/c activities  limitations if indicated, with other d/c instructions as indicated by MD - patient able to verbalize understanding, all questions fully answered. Patient instructed to return to ED, call 911, or call MD for any changes in condition.  Patient escorted via WC, and D/C home via private auto.  Lilton Relic, RN 02/24/2024 12:33 PM

## 2024-02-25 ENCOUNTER — Encounter (HOSPITAL_COMMUNITY): Payer: Self-pay | Admitting: Orthopedic Surgery

## 2024-02-25 ENCOUNTER — Ambulatory Visit: Admitting: Orthopedic Surgery

## 2024-02-25 ENCOUNTER — Telehealth: Payer: Self-pay | Admitting: Orthopedic Surgery

## 2024-02-25 DIAGNOSIS — S72002D Fracture of unspecified part of neck of left femur, subsequent encounter for closed fracture with routine healing: Secondary | ICD-10-CM

## 2024-02-25 LAB — BODY FLUID CULTURE W GRAM STAIN: Culture: NO GROWTH

## 2024-02-25 NOTE — Telephone Encounter (Signed)
 I called her to let her know, she states she already called [facility] and they told her if we send orders they will get it for her.

## 2024-02-25 NOTE — Telephone Encounter (Addendum)
 I faxed order to [facility] I dont even know if they have them, I have never sent orders for one.

## 2024-02-25 NOTE — Addendum Note (Signed)
 Addended byArla Lab on: 02/25/2024 11:43 AM   Modules accepted: Orders

## 2024-02-25 NOTE — Telephone Encounter (Signed)
 Dr. Delfino Fellers pt - spoke w/the pt, she had surgery on 4/25.  She is requesting a hospital bed.  [phone] or [phone]

## 2024-02-26 ENCOUNTER — Telehealth: Payer: Self-pay | Admitting: Orthopedic Surgery

## 2024-02-26 NOTE — Telephone Encounter (Signed)
 Dr. Delfino Fellers pt - spoke w/Via with [facility] this morning, she needed the face to face notes.  Advised the pt has not been to the office yet, she had surgery on 02/22/24.  I faxed the surgical notes to [phone].

## 2024-02-27 ENCOUNTER — Telehealth: Payer: Self-pay | Admitting: Orthopedic Surgery

## 2024-02-27 ENCOUNTER — Emergency Department (HOSPITAL_COMMUNITY)
Admission: EM | Admit: 2024-02-27 | Discharge: 2024-02-27 | Disposition: A | Discharge status: Home / Self Care | Attending: Emergency Medicine | Admitting: Emergency Medicine

## 2024-02-27 ENCOUNTER — Other Ambulatory Visit: Payer: Self-pay

## 2024-02-27 ENCOUNTER — Emergency Department (HOSPITAL_COMMUNITY)

## 2024-02-27 DIAGNOSIS — Z7901 Long term (current) use of anticoagulants: Secondary | ICD-10-CM | POA: Insufficient documentation

## 2024-02-27 DIAGNOSIS — R600 Localized edema: Secondary | ICD-10-CM | POA: Diagnosis not present

## 2024-02-27 DIAGNOSIS — R2242 Localized swelling, mass and lump, left lower limb: Secondary | ICD-10-CM | POA: Insufficient documentation

## 2024-02-27 DIAGNOSIS — M25559 Pain in unspecified hip: Secondary | ICD-10-CM | POA: Diagnosis not present

## 2024-02-27 DIAGNOSIS — Z96642 Presence of left artificial hip joint: Secondary | ICD-10-CM | POA: Insufficient documentation

## 2024-02-27 DIAGNOSIS — R609 Edema, unspecified: Secondary | ICD-10-CM | POA: Diagnosis not present

## 2024-02-27 DIAGNOSIS — M7989 Other specified soft tissue disorders: Secondary | ICD-10-CM

## 2024-02-27 LAB — ANAEROBIC CULTURE W GRAM STAIN

## 2024-02-27 MED ORDER — HYDROCODONE-ACETAMINOPHEN 5-325 MG PO TABS: 1.0000 | ORAL_TABLET | Freq: Four times a day (QID) | ORAL | 0 refills | Status: DC | PRN

## 2024-02-27 MED ORDER — HYDROCODONE-ACETAMINOPHEN 5-325 MG PO TABS: 1.0000 | ORAL_TABLET | Freq: Four times a day (QID) | ORAL | 0 refills | Status: AC | PRN

## 2024-02-27 NOTE — Discharge Instructions (Signed)
 You were seen for your leg swelling in the emergency department.   At home, please use compression stockings for the swelling.  Use the Norco you are prescribed for pain.    Check your MyChart online for the results of any tests that had not resulted by the time you left the emergency department.   Follow-up with your primary doctor in 2-3 days regarding your visit.  Follow-up with your orthopedist.  Return immediately to the emergency department if you experience any of the following: Worsening pain, swelling, chest pain, shortness of breath, or any other concerning symptoms.    Thank you for visiting our Emergency Department. It was a pleasure taking care of you today.

## 2024-02-27 NOTE — ED Notes (Signed)
 Patient expressed that no one from [facility] has reached out to her since her surgery Friday . Writer reached out to Amy with doctor [place] office and Amy stated, " we have never seen her in the office, her home health PT should have been ordered when she was in hospital, I do not see order". Writer did review patient chart and did not see anything. However, patient shared that she was told by someone at the hospital that [facility] was setup and she should get a call. Writer went ahead and reached out to [employer] for PT/OT services . MD made aware of orders needed and face to face. TOC signing off.

## 2024-02-27 NOTE — ED Provider Notes (Signed)
 [facility] Provider Note   CSN: [id number] Arrival date & time: 02/27/24  3295     History  Chief Complaint  Patient presents with   Hip Pain    [facility] is a 78 y.o. female.  78 year old female with history of left femoral fracture status post hemiarthroplasty on 02/22/2024 who presents to the emergency department with left leg swelling.  Patient reports that after her operation she was doing well for several days.  Noted that her left leg is now swollen after she has been getting up and been more ambulatory.  No pain.  No chest pain or shortness of breath.  Was on Eliquis 2.5 mg twice daily for DVT prophylaxis which she has been compliant with.  Is concerned about the swelling so decided to come into the emergency department for evaluation.       Home Medications Prior to Admission medications   Medication Sig Start Date End Date Taking? Authorizing Provider  ACCU-CHEK FASTCLIX LANCETS MISC  04/21/14   [provider]  acetaminophen  (TYLENOL ) 325 MG tablet Take 1-2 tablets (325-650 mg total) by mouth every 6 (six) hours as needed for mild pain (pain score 1-3) or moderate pain (pain score 4-6) (or temp > 100.5). 02/24/24   Johnson, Clanford L, MD  amLODipine (NORVASC) 5 MG tablet Take 5 mg by mouth daily. 12/17/23   [provider]  apixaban (ELIQUIS) 2.5 MG TABS tablet Take 1 tablet (2.5 mg total) by mouth 2 (two) times daily for 28 days. 02/25/24 03/24/24  Rayfield Cairo, MD  Cholecalciferol (VITAMIN D -3) 5000 UNITS TABS Take 1 tablet by mouth daily.    [provider]  empagliflozin  (JARDIANCE ) 10 MG TABS tablet Take 10 mg by mouth daily.    [provider]  HYDROcodone -acetaminophen  (NORCO/VICODIN) 5-325 MG tablet Take 1 tablet by mouth every 6 (six) hours as needed for up to 3 days for severe pain (pain score 7-10). 02/27/24 03/01/24  Ninetta Basket, MD  mometasone  (ELOCON ) 0.1 % cream  Apply to rash on back QD on Monday, Wednesday, Friday, and Saturday PRN. 10/02/22   Elta Halter, MD  montelukast  (SINGULAIR ) 10 MG tablet Take 1 tablet (10 mg total) by mouth at bedtime. 04/07/23   Leath-Warren, Belen Bowers, NP  ONE TOUCH ULTRA TEST test strip  04/21/14   [provider]  pantoprazole  (PROTONIX ) 40 MG tablet Take 40 mg by mouth 2 (two) times daily before a meal.    [provider]  polyethylene glycol (MIRALAX  / GLYCOLAX ) 17 g packet Take 17 g by mouth daily as needed for mild constipation. 02/24/24   Johnson, Clanford L, MD  simvastatin  (ZOCOR ) 20 MG tablet Take 20 mg by mouth every evening.    [provider]  tretinoin  (RETIN-A ) 0.025 % cream Apply 1 Application topically at bedtime. Apply a pea sized amount to the entire face QHS. 02/24/24   Rayfield Cairo, MD      Allergies    Patient has no known allergies.    Review of Systems   Review of Systems  Physical Exam Updated Vital Signs BP 130/71   Pulse 85   Temp 98.4 F (36.9 C)   Resp 17   SpO2 97%  Physical Exam Musculoskeletal:     Right lower leg: No edema.     Left lower leg: Edema present.     Comments: Incision site on left hip with staples present.  Clean dry  and intact.  No erythema or warmth.  No discharge.  Full range of motion of the left hip.  Left knee with mild swelling but still has full range of motion of the left knee.  No erythema or warmth of the knee.  No deformities.  DP pulses 2+ bilaterally.     ED Results / Procedures / Treatments   Labs (all labs ordered are listed, but only abnormal results are displayed) Labs Reviewed - No data to display  EKG None  Radiology US  Venous Img Lower Unilateral Left Result Date: 02/27/2024 CLINICAL DATA:  Left lower extremity edema. Status post left hip replacement 02/22/2024 EXAM: LEFT LOWER EXTREMITY VENOUS DOPPLER ULTRASOUND TECHNIQUE: Gray-scale sonography with compression, as well as color and duplex ultrasound,  were performed to evaluate the deep venous system(s) from the level of the common femoral vein through the popliteal and proximal calf veins. COMPARISON:  None Available. FINDINGS: VENOUS Normal compressibility of the common femoral, superficial femoral, and popliteal veins, as well as the visualized calf veins. Visualized portions of profunda femoral vein and great saphenous vein unremarkable. No filling defects to suggest DVT on grayscale or color Doppler imaging. Doppler waveforms show normal direction of venous flow, normal respiratory plasticity and response to augmentation. Limited views of the contralateral common femoral vein are unremarkable. OTHER None. Limitations: none IMPRESSION: Negative. Electronically Signed   By: Donnal Fusi M.D.   On: 02/27/2024 09:04    Procedures Procedures    Medications Ordered in ED Medications - No data to display  ED Course/ Medical Decision Making/ A&P                                 Medical Decision Making Risk Prescription drug management.   [facility] is a 78 y.o. female with comorbidities that complicate the patient evaluation including left femoral fracture status post hemiarthroplasty on 02/22/2024 who presents to the emergency department with left leg swelling.   Initial Ddx:  DVT, PE, postoperative swelling and pain, postoperative infection  MDM/Course:  Patient presents emergency department with left lower extremity swelling.  This seems to have worsened after operation.  Has been trying to get up and ambulate more recently.  Has been compliant with her DVT prophylaxis (Eliquis) at home.  On exam does have a swollen leg.  No significant joint effusions noted.  Her surgical site does not appear to be infected.  Had an ultrasound to rule out breakthrough DVT which was negative.  Suspect that she is having some postoperative swelling.  She says that she has not heard yet from home health PT so [facility] consult was placed with face-to-face  order for home health PT.  Says that she is not having significant pain now but occasionally will have pain and was only given 12 Norco at discharge and only has 2 pills left.  Did give her a brief refill of Norco until she is able to follow-up with her orthopedist as an outpatient.    This patient presents to the ED for concern of complaints listed in HPI, this involves an extensive number of treatment options, and is a complaint that carries with it a high risk of complications and morbidity. Disposition including potential need for admission considered.   Dispo: DC Home. Return precautions discussed including, but not limited to, those listed in the AVS. Allowed pt time to ask questions which were answered fully prior to dc.  Records  reviewed DC Summary I have reviewed the patients home medications and made adjustments as needed Consults: TOC Social Determinants of health:  Geriatric   Portions of this note were generated with Scientist, clinical (histocompatibility and immunogenetics). Dictation errors may occur despite best attempts at proofreading.     Final Clinical Impression(s) / ED Diagnoses Final diagnoses:  Left leg swelling  History of arthroplasty of left hip    Rx / DC Orders ED Discharge Orders          Ordered    HYDROcodone -acetaminophen  (NORCO/VICODIN) 5-325 MG tablet  Every 6 hours PRN        02/27/24 0922              Ninetta Basket, MD 02/27/24 [phone]

## 2024-02-27 NOTE — ED Notes (Signed)
 Patient has discharge orders, discharge teaching given and no further questions at this time.

## 2024-02-27 NOTE — ED Triage Notes (Addendum)
 Pt had hip replacement last Friday, she mentions that her hip is swollen but she mentions it has been swollen since the surgery, she was able ambulate with a walker to the stretcher for the medic. She isnt having any pain at this time.   Medic vitals   130/90 94hr 98%ra 18rr

## 2024-02-27 NOTE — Telephone Encounter (Signed)
 I have just gotten a message from social worker at hospital asking if patient has been set up for home health PT, I advised if she needed it orders should have been sent when she was in hospital / when she was d/c I do not see any orders  She is back in the ER and is reporting she has not had any home health yetl

## 2024-02-29 ENCOUNTER — Telehealth: Payer: Self-pay

## 2024-02-29 DIAGNOSIS — Z6828 Body mass index (BMI) 28.0-28.9, adult: Secondary | ICD-10-CM | POA: Diagnosis not present

## 2024-02-29 DIAGNOSIS — E119 Type 2 diabetes mellitus without complications: Secondary | ICD-10-CM | POA: Diagnosis not present

## 2024-02-29 DIAGNOSIS — E782 Mixed hyperlipidemia: Secondary | ICD-10-CM | POA: Diagnosis not present

## 2024-02-29 DIAGNOSIS — Z7984 Long term (current) use of oral hypoglycemic drugs: Secondary | ICD-10-CM | POA: Diagnosis not present

## 2024-02-29 DIAGNOSIS — S72002D Fracture of unspecified part of neck of left femur, subsequent encounter for closed fracture with routine healing: Secondary | ICD-10-CM | POA: Diagnosis not present

## 2024-02-29 DIAGNOSIS — N183 Chronic kidney disease, stage 3 unspecified: Secondary | ICD-10-CM | POA: Diagnosis not present

## 2024-02-29 DIAGNOSIS — S72012D Unspecified intracapsular fracture of left femur, subsequent encounter for closed fracture with routine healing: Secondary | ICD-10-CM | POA: Diagnosis not present

## 2024-02-29 DIAGNOSIS — E669 Obesity, unspecified: Secondary | ICD-10-CM | POA: Diagnosis not present

## 2024-02-29 DIAGNOSIS — Z96642 Presence of left artificial hip joint: Secondary | ICD-10-CM | POA: Diagnosis not present

## 2024-02-29 DIAGNOSIS — E1122 Type 2 diabetes mellitus with diabetic chronic kidney disease: Secondary | ICD-10-CM | POA: Diagnosis not present

## 2024-02-29 DIAGNOSIS — Z6832 Body mass index (BMI) 32.0-32.9, adult: Secondary | ICD-10-CM | POA: Diagnosis not present

## 2024-02-29 DIAGNOSIS — Z556 Problems related to health literacy: Secondary | ICD-10-CM | POA: Diagnosis not present

## 2024-02-29 DIAGNOSIS — I10 Essential (primary) hypertension: Secondary | ICD-10-CM | POA: Diagnosis not present

## 2024-02-29 DIAGNOSIS — Z7901 Long term (current) use of anticoagulants: Secondary | ICD-10-CM | POA: Diagnosis not present

## 2024-02-29 DIAGNOSIS — Z9181 History of falling: Secondary | ICD-10-CM | POA: Diagnosis not present

## 2024-02-29 DIAGNOSIS — E6609 Other obesity due to excess calories: Secondary | ICD-10-CM | POA: Diagnosis not present

## 2024-02-29 DIAGNOSIS — C50919 Malignant neoplasm of unspecified site of unspecified female breast: Secondary | ICD-10-CM | POA: Diagnosis not present

## 2024-02-29 DIAGNOSIS — E876 Hypokalemia: Secondary | ICD-10-CM | POA: Diagnosis not present

## 2024-02-29 NOTE — Telephone Encounter (Signed)
 [place] nurse manager with [facility] left message to let Dr. Phyllis Breeze know that patient has gotten in home care set up, but patient requested a delay for starting until 03/02/24.

## 2024-03-04 ENCOUNTER — Telehealth: Payer: Self-pay | Admitting: Orthopedic Surgery

## 2024-03-04 NOTE — Telephone Encounter (Signed)
 Noted, thank you

## 2024-03-04 NOTE — Telephone Encounter (Signed)
 Dr. Delfino Fellers pt - spoke w/Annette w/[facility] [phone], she stated today is the first time she's seeing the pt and the pt took her bandage off the day after surgery.  She stated looks dry and doesn't look infected.  She wanted to make us  aware.

## 2024-03-06 ENCOUNTER — Ambulatory Visit (INDEPENDENT_AMBULATORY_CARE_PROVIDER_SITE_OTHER): Admitting: Orthopedic Surgery

## 2024-03-06 ENCOUNTER — Encounter: Payer: Self-pay | Admitting: Orthopedic Surgery

## 2024-03-06 ENCOUNTER — Other Ambulatory Visit: Payer: Self-pay | Admitting: Orthopedic Surgery

## 2024-03-06 DIAGNOSIS — S72002D Fracture of unspecified part of neck of left femur, subsequent encounter for closed fracture with routine healing: Secondary | ICD-10-CM

## 2024-03-06 DIAGNOSIS — G8918 Other acute postprocedural pain: Secondary | ICD-10-CM

## 2024-03-06 MED ORDER — HYDROCODONE-ACETAMINOPHEN 5-325 MG PO TABS: 1.0000 | ORAL_TABLET | Freq: Four times a day (QID) | ORAL | 0 refills | Status: DC | PRN

## 2024-03-06 MED ORDER — HYDROCODONE-ACETAMINOPHEN 5-325 MG PO TABS: 1.0000 | ORAL_TABLET | Freq: Four times a day (QID) | ORAL | 0 refills | Status: AC | PRN

## 2024-03-06 NOTE — Telephone Encounter (Signed)
 Please resend to [facility] I selected incorrect pharmacy in the room today Apologies

## 2024-03-06 NOTE — Progress Notes (Signed)
    Chief Complaint  Patient presents with   Post-op Follow-up    Hip left 02/20/24    Encounter Diagnoses  Name Primary?   Closed fracture of neck of left femur with routine healing, subsequent encounter 02/20/24    Post-operative pain Yes    DOI/DOS/ Date: 02/20/24  Improved  Postop left partial hip replacement.  Patient presented to the hospital with subacute fracture after being on vacation for over a week.  She fractured her hip getting onto the shuttle bus to go on her trip.  She stayed in [place] with the fractured hip came back and then came to the hospital once she was having some swelling in her feet  Currently on Eliquis  twice a day  Hydrocodone  for pain  Incision clean dry intact staples removed  Leg lengths equal  Hip range of motion normal  Continue physical therapy  Medication refilled continue taper of opioids  Recheck in 4 weeks  Meds ordered this encounter  Medications   HYDROcodone -acetaminophen  (NORCO/VICODIN) 5-325 MG tablet    Sig: Take 1 tablet by mouth every 6 (six) hours as needed.    Dispense:  30 tablet    Refill:  0

## 2024-03-06 NOTE — Progress Notes (Signed)
   There were no vitals taken for this visit.  There is no height or weight on file to calculate BMI.  Chief Complaint  Patient presents with   Post-op Follow-up    Hip left 02/20/24    Encounter Diagnosis  Name Primary?   Closed fracture of neck of left femur with routine healing, subsequent encounter 02/20/24 Yes    DOI/DOS/ Date: 02/20/24  Improved

## 2024-03-12 ENCOUNTER — Ambulatory Visit (HOSPITAL_COMMUNITY)

## 2024-03-14 LAB — CULTURE, FUNGUS WITHOUT SMEAR

## 2024-04-03 ENCOUNTER — Encounter: Admitting: Orthopedic Surgery

## 2024-04-10 ENCOUNTER — Encounter: Payer: Self-pay | Admitting: Orthopedic Surgery

## 2024-04-10 ENCOUNTER — Ambulatory Visit: Admitting: Orthopedic Surgery

## 2024-04-10 DIAGNOSIS — S72002D Fracture of unspecified part of neck of left femur, subsequent encounter for closed fracture with routine healing: Secondary | ICD-10-CM

## 2024-04-10 NOTE — Progress Notes (Signed)
  Postop appointment  Postop day #50   Chief Complaint  Patient presents with   Post-op Follow-up    Hip     Encounter Diagnosis  Name Primary?   Closed fracture of neck of left femur with routine healing, subsequent encounter 02/20/24 Yes    DOI/DOS/ Date: 02/20/24  Improved postop from left hip surgery for femoral neck fracture treated with bipolar replacement  However, she is having some abductor lurch  Hip range of motion is normal She does have some fluid over the left hip which can indicate abductor rupture  Recommend continued physical therapy return  In August may need MRI to evaluate abductor musculature

## 2024-04-10 NOTE — Progress Notes (Signed)
   There were no vitals taken for this visit.  There is no height or weight on file to calculate BMI.  Chief Complaint  Patient presents with   Post-op Follow-up    Hip     Encounter Diagnosis  Name Primary?   Closed fracture of neck of left femur with routine healing, subsequent encounter 02/20/24 Yes    DOI/DOS/ Date: 02/20/24  Improved

## 2024-04-11 ENCOUNTER — Telehealth: Payer: Self-pay | Admitting: Orthopedic Surgery

## 2024-04-11 DIAGNOSIS — S72002D Fracture of unspecified part of neck of left femur, subsequent encounter for closed fracture with routine healing: Secondary | ICD-10-CM | POA: Diagnosis not present

## 2024-04-11 DIAGNOSIS — N183 Chronic kidney disease, stage 3 unspecified: Secondary | ICD-10-CM | POA: Diagnosis not present

## 2024-04-11 NOTE — Telephone Encounter (Signed)
 Dr. Delfino Fellers pt [facility] PT w/[facility] [phone] lvm stating that they were going to D/C this pt, but the patient is stating that Dr. Marvina Slough was going to request she continues HH.  She needs clarification, is she to continue PT, HH or OP PT?

## 2024-04-16 NOTE — Telephone Encounter (Signed)
 LM for her to advise and spoke to hhpt to advise.

## 2024-04-16 NOTE — Addendum Note (Signed)
 Addended byArla Lab on: 04/16/2024 02:20 PM   Modules accepted: Orders

## 2024-04-16 NOTE — Telephone Encounter (Signed)
 Dr. Delfino Fellers pt [facility] PT w/[facility] [phone] lvm stated she called last week and she stated that Dr. Marvina Slough wanted her to continue PT, but their question is they have already stopped bc she is not homebound, she's driving and going to stores.  She wanted to know if you wanted to order OP PT.

## 2024-04-23 ENCOUNTER — Telehealth: Payer: Self-pay | Admitting: Orthopedic Surgery

## 2024-04-23 NOTE — Telephone Encounter (Signed)
 DR. MARGRETTE  Amy patient called wanting to know about her PT.  She states you was supposed to call her back with a date an time for PT.  Please call her back [phone]

## 2024-04-23 NOTE — Telephone Encounter (Signed)
 Physical therapy has been ordered for her at [facility]. They should call her to schedule, [phone] is the phone number to call, if she wants to call to schedule.  I called her to advise, she voiced understanding

## 2024-04-24 ENCOUNTER — Other Ambulatory Visit: Payer: Self-pay | Admitting: Dermatology

## 2024-04-24 DIAGNOSIS — L209 Atopic dermatitis, unspecified: Secondary | ICD-10-CM

## 2024-04-24 DIAGNOSIS — L700 Acne vulgaris: Secondary | ICD-10-CM

## 2024-04-28 ENCOUNTER — Other Ambulatory Visit: Payer: Self-pay | Admitting: Dermatology

## 2024-04-28 DIAGNOSIS — L700 Acne vulgaris: Secondary | ICD-10-CM

## 2024-04-28 DIAGNOSIS — L209 Atopic dermatitis, unspecified: Secondary | ICD-10-CM

## 2024-05-12 ENCOUNTER — Ambulatory Visit: Admitting: Orthopedic Surgery

## 2024-05-12 VITALS — BP 120/76 | HR 69

## 2024-05-12 DIAGNOSIS — S72002D Fracture of unspecified part of neck of left femur, subsequent encounter for closed fracture with routine healing: Secondary | ICD-10-CM

## 2024-05-12 NOTE — Patient Instructions (Signed)
 Follow up with Dr. Margrette on Aug 28th after Physical therapy

## 2024-05-12 NOTE — Progress Notes (Signed)
 Follow-up visit  Chief Complaint  Patient presents with   Follow-up    3 month follow up   Encounter Diagnosis  Name Primary?   Closed fracture of neck of left femur with routine healing, subsequent encounter 02/22/24 Bipolar Yes     78 year old female had left hip fracture bipolar replacement direct lateral approach  She has abductor lurch with abductor weakness  She was scheduled for physical therapy but could not get an appointment until July 31  She is still walking with the lurch  Swelling has gone down around the hip  Recommend PT follow-up in 4 weeks to reassess hip weakness

## 2024-05-12 NOTE — Progress Notes (Signed)
   There were no vitals taken for this visit.  There is no height or weight on file to calculate BMI.  No chief complaint on file.   Encounter Diagnosis  Name Primary?   Closed fracture of neck of left femur with routine healing, subsequent encounter 02/22/24 Bipolar Yes    DOI/DOS/ Date: 90 day femoral neck fx   Improved- still walking with cane. Starts therapy on 07/31.

## 2024-05-20 ENCOUNTER — Telehealth: Payer: Self-pay | Admitting: Orthopedic Surgery

## 2024-05-20 MED ORDER — CEPHALEXIN 500 MG PO CAPS: 2000.0000 mg | ORAL_CAPSULE | Freq: Once | ORAL | 0 refills | Status: AC

## 2024-05-20 NOTE — Telephone Encounter (Signed)
 Called the dentist office and advised, Lauraine verbalized understanding.

## 2024-05-20 NOTE — Telephone Encounter (Signed)
 Dr. Areatha pt - [facility] w/[facility] [phone], needs some premed info for this patient.

## 2024-05-29 ENCOUNTER — Other Ambulatory Visit: Payer: Self-pay

## 2024-05-29 ENCOUNTER — Encounter (HOSPITAL_COMMUNITY): Payer: Self-pay

## 2024-05-29 ENCOUNTER — Ambulatory Visit (HOSPITAL_COMMUNITY): Attending: Orthopedic Surgery

## 2024-05-29 DIAGNOSIS — Z7409 Other reduced mobility: Secondary | ICD-10-CM | POA: Insufficient documentation

## 2024-05-29 DIAGNOSIS — S72002D Fracture of unspecified part of neck of left femur, subsequent encounter for closed fracture with routine healing: Secondary | ICD-10-CM | POA: Insufficient documentation

## 2024-05-29 DIAGNOSIS — R29898 Other symptoms and signs involving the musculoskeletal system: Secondary | ICD-10-CM | POA: Insufficient documentation

## 2024-05-29 DIAGNOSIS — R262 Difficulty in walking, not elsewhere classified: Secondary | ICD-10-CM | POA: Diagnosis not present

## 2024-05-29 NOTE — Therapy (Signed)
 OUTPATIENT PHYSICAL THERAPY LOWER EXTREMITY EVALUATION   Patient Name: Tiffany Hanson MRN: [id number] DOB:Oct 22, 1946, 78 y.o., female Today's Date: 05/29/2024  END OF SESSION:  PT End of Session - 05/29/24 1745     Visit Number 1    Date for PT Re-Evaluation 07/10/24    Authorization Type [employer]    Authorization Time Period no auth    PT Start Time 1600    PT Stop Time 1640    PT Time Calculation (min) 40 min    Activity Tolerance Patient tolerated treatment well    Behavior During Therapy [facility] for tasks assessed/performed          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   Lichen sclerosus 05/09/2013   Osteopenia    Personal history of chemotherapy    Right breast   Personal history of radiation therapy    Right breast   S/P radiation therapy    completed in April 2010   Superficial fungus infection of skin 04/27/2015   Vaginal Pap smear, abnormal    Vitiligo    Past Surgical History:  Procedure Laterality Date   BLADDER SUSPENSION     16 to 17 years ago   BREAST LUMPECTOMY  08/20/08   HIP ARTHROPLASTY Left 02/22/2024   Procedure: HEMIARTHROPLASTY (BIPOLAR) HIP, direct lateral  APPROACH FOR FRACTURE;  Surgeon: Margrette Taft BRAVO, MD;  Location: [facility];  Service: Orthopedics;  Laterality: Left;   HYSTEROSCOPY     TUBAL LIGATION  1993   Patient Active Problem List   Diagnosis Date Noted   Hypokalemia 02/20/2024   Closed fracture of neck of left femur (HCC) 02/20/2024   Mixed hyperlipidemia 02/20/2024   Yeast infection 02/17/2020   Vaginal itching 02/17/2020   Vaginal discharge 02/17/2020   Superficial fungus infection of skin 04/27/2015   Lichen sclerosus 05/09/2013   Vitiligo 04/23/2013   Diabetes (HCC)  04/23/2013   Hypertension 04/23/2013   Leukoplakia, 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   Breast cancer (HCC) 12/16/2012    [facility]P: [facility]   REFERRING PROVIDER: Margrette Taft BRAVO, MD  REFERRING DIAG: S72.002D (ICD-10-CM) - Closed fracture of neck of left femur with routine healing, subsequent encounter  THERAPY DIAG:  Weakness of left hip  Impaired functional mobility, balance, gait, and endurance  Difficulty walking  Rationale for Evaluation and Treatment: Rehabilitation  ONSET DATE: 02/09/24 fall, 02/22/24 surgery  SUBJECTIVE:   SUBJECTIVE STATEMENT: Pt states she had fallen in [employer] parking lot and went to [place] on vacation, spent most of the time in the room and came back and found out hip was broke.  PERTINENT HISTORY: Cancer 2010  PAIN:  Are you having pain? Yes: NPRS scale: 5/10 Pain location: Left hip Pain description: burns Aggravating factors: random Relieving factors: ignoring  PRECAUTIONS: Fall  RED FLAGS: None   WEIGHT BEARING RESTRICTIONS: No  FALLS:  Has patient fallen in last 6 months? Yes. Number of falls 1  LIVING ENVIRONMENT: Lives with: lives with their spouse Lives in: House/apartment Stairs: Yes: Internal: 13 steps; on right going up and External: 6 steps; on right going up Has following equipment at home: Single point cane and Walker - 2 wheeled  OCCUPATION: retired  PLOF: Independent with basic ADLs,  Needs assistance with ADLs, and Needs assistance with homemaking  PATIENT GOALS: get off of the cane, walk better, be able to walk further too for traveling   NEXT MD VISIT: 06/26/24  OBJECTIVE:  Note: Objective measures were completed at Evaluation unless otherwise noted.  DIAGNOSTIC FINDINGS: CLINICAL DATA:  Closed fracture.  Postoperative.   EXAM: LEFT FEMUR 2 VIEWS   COMPARISON:  Pelvis and left hip radiographs 02/20/2024   FINDINGS: Interval left hip hemiarthroplasty. No perihardware lucency is seen to  indicate hardware failure or loosening. Expected postoperative changes including lateral left hip and anterior and lateral left thigh subcutaneous air. Lateral left hip surgical skin staples. Individual suture anchors again overlie the right and left pubic bodies. Mild-to-moderate atherosclerotic of the mediolateral knee compartments. Mild chronic enthesopathic change at the quadriceps and patellar tendon insertions on the patella. No acute fracture or dislocation.   IMPRESSION: Interval left hip hemiarthroplasty without evidence of hardware failure.  PATIENT SURVEYS:  LEFS : 38/80   COGNITION: Overall cognitive status: Within functional limits for tasks assessed     SENSATION: WFL  PALPATION: Pt does not demonstrate any tenderness to palpation to surgical site, left hip joint or corresponding musculature  LOWER EXTREMITY ROM:  Active ROM Right eval Left eval  Hip flexion 101 106  Hip extension    Hip abduction    Hip adduction    Hip internal rotation    Hip external rotation    Knee flexion    Knee extension    Ankle dorsiflexion    Ankle plantarflexion    Ankle inversion    Ankle eversion     (Blank rows = not tested)  LOWER EXTREMITY MMT:  MMT Right eval Left eval  Hip flexion 3+ 3-  Hip extension 2+ 2+  Hip abduction 3 3-  Hip adduction 3 3  Hip internal rotation    Hip external rotation    Knee flexion    Knee extension    Ankle dorsiflexion    Ankle plantarflexion    Ankle inversion    Ankle eversion     (Blank rows = not tested)    FUNCTIONAL TESTS:  5 times sit to stand: 19.13 seconds, BUE support 2 minute walk test: 274 feet, SPC  GAIT: Distance walked: 300 feet Assistive device utilized: Single point cane Level of assistance: Modified independence Comments: Pt demonstrates trendelenburg gait pattern which is help significantly with SPC. Pt demonstrates decreased gait speed and decreased stance time on LLE.                                                                                                                                 TREATMENT DATE:  05/29/2024   Evaluation: -ROM measured, Strength assessed, HEP prescribed, pt educated on prognosis, findings, and importance of HEP compliance if given.     PATIENT EDUCATION:  Education details: Pt was educated on findings of PT evaluation, prognosis, frequency of  therapy visits and rationale, attendance policy, and HEP if given.   Person educated: Patient Education method: Explanation, Verbal cues, and Handouts Education comprehension: verbalized understanding, verbal cues required, and needs further education  HOME EXERCISE PROGRAM: Access Code: [email or web address] URL: [email or web address] Date: 05/29/2024 Prepared by: Lang Ada  Exercises - Supine Bridge  - 1 x daily - 7 x weekly - 3 sets - 10 reps - Clamshell with Resistance  - 1 x daily - 7 x weekly - 3 sets - 10 reps - Sit to Stand with Arms Crossed  - 1 x daily - 7 x weekly - 3 sets - 10 reps  ASSESSMENT:  CLINICAL IMPRESSION: Patient is a 78 y.o. female who was seen today for physical therapy evaluation and treatment for S72.002D (ICD-10-CM) - Closed fracture of neck of left femur with routine healing, subsequent encounter.   Patient demonstrates abnormal burning sensation of left hip, decreased LLE strength, abnormal gait pattern, and impaired balance. Patient also demonstrates difficulty with ambulation during today's session with decreased stride length and velocity noted. Patient also demonstrates bilateral LE weakness especially abduction and extension of L hip. Patient requires education on role of PT, HEP, importance of increased physical activity. Patient would benefit from skilled physical therapy for increased endurance with ambulation, increased LE strength, and balance for improved improved independence with gait, return to higher level of function with ADLs, and progress towards therapy  goals.   OBJECTIVE IMPAIRMENTS: Abnormal gait, decreased activity tolerance, decreased balance, decreased knowledge of use of DME, decreased mobility, difficulty walking, decreased strength, and pain.   ACTIVITY LIMITATIONS: carrying, lifting, squatting, transfers, and bed mobility  PARTICIPATION LIMITATIONS: meal prep, cleaning, laundry, driving, shopping, community activity, and yard work  PERSONAL FACTORS: Age, Past/current experiences, Time since onset of injury/illness/exacerbation, and 1 comorbidity: fall history are also affecting patient's functional outcome.   REHAB POTENTIAL: Good  CLINICAL DECISION MAKING: Stable/uncomplicated  EVALUATION COMPLEXITY: Low   GOALS: Goals reviewed with patient? No  SHORT TERM GOALS: Target date: 06/19/24  Patient will demonstrate evidence of independence with individualized HEP and will report compliance for at least 3 days per week for optimized progression towards remaining therapy goals. Baseline:  Goal status: INITIAL  2.  Patient will report a decrease in pain level during community ambulation by at least 2 points for improved quality of life. Baseline: 5/10 Goal status: INITIAL     LONG TERM GOALS: Target date: 07/10/24  Pt will demonstrate a an increase of at least 9 points on the LEFS for improved performance of community ambulation and ADL. Baseline: see objective Goal status: INITIAL  2.  Pt will improve 2 MWT by 40 feet with no AD in order to demonstrate improved functional ambulatory capacity in community setting.  Baseline: see objective Goal status: INITIAL  3.  Pt will demonstrate WFL gait pattern with no AD and no trendelenburg pattern, for increased mobility and maximal efficiency of gait cycle during ambulation. Baseline: must have SPC to avoid abnormal gait pattern Goal status: INITIAL  4.  Pt will demonstrate at least 4/5 MMT for right lower extremity for increased strength during ADL and community  ambulation. Baseline: see objective Goal status: INITIAL  5.  Pt will improve 5TSTS time by at least 5 seconds in order to improve strength during functional activities. Baseline: see objective Goal status: INITIAL    PLAN:  PT FREQUENCY: 3x per week and then 1-2 times per week  PT DURATION: 6 weeks  PLANNED INTERVENTIONS: 97110-Therapeutic exercises, 97530-  Therapeutic activity, [id number]- Neuromuscular re-education, [phone]- Self Care, [id number]- Manual therapy, [phone]- Gait training, Patient/Family education, Balance training, Stair training, Joint mobilization, and DME instructions  PLAN FOR NEXT SESSION: progress proximal LE strengthening (focus on glute max and medius), progress balance and functional strength to pt toleration   Lang Ada, PT, DPT [facility] Office: [phone] 5:47 PM, 05/29/24

## 2024-05-30 DIAGNOSIS — E1122 Type 2 diabetes mellitus with diabetic chronic kidney disease: Secondary | ICD-10-CM | POA: Diagnosis not present

## 2024-05-30 DIAGNOSIS — Z6825 Body mass index (BMI) 25.0-25.9, adult: Secondary | ICD-10-CM | POA: Diagnosis not present

## 2024-05-30 DIAGNOSIS — R143 Flatulence: Secondary | ICD-10-CM | POA: Diagnosis not present

## 2024-05-30 DIAGNOSIS — E782 Mixed hyperlipidemia: Secondary | ICD-10-CM | POA: Diagnosis not present

## 2024-06-02 ENCOUNTER — Ambulatory Visit (HOSPITAL_COMMUNITY)
Admission: RE | Admit: 2024-06-02 | Discharge: 2024-06-02 | Disposition: A | Discharge status: Home / Self Care | Source: Ambulatory Visit | Attending: Family Medicine | Admitting: Family Medicine

## 2024-06-02 ENCOUNTER — Encounter (HOSPITAL_COMMUNITY): Payer: Self-pay

## 2024-06-02 DIAGNOSIS — Z1231 Encounter for screening mammogram for malignant neoplasm of breast: Secondary | ICD-10-CM | POA: Diagnosis not present

## 2024-06-04 ENCOUNTER — Encounter (HOSPITAL_COMMUNITY): Payer: Self-pay

## 2024-06-04 ENCOUNTER — Ambulatory Visit (HOSPITAL_COMMUNITY): Attending: Orthopedic Surgery

## 2024-06-04 DIAGNOSIS — Z7409 Other reduced mobility: Secondary | ICD-10-CM | POA: Insufficient documentation

## 2024-06-04 DIAGNOSIS — R29898 Other symptoms and signs involving the musculoskeletal system: Secondary | ICD-10-CM | POA: Diagnosis not present

## 2024-06-04 DIAGNOSIS — R262 Difficulty in walking, not elsewhere classified: Secondary | ICD-10-CM | POA: Insufficient documentation

## 2024-06-04 NOTE — Therapy (Signed)
 OUTPATIENT PHYSICAL THERAPY LOWER EXTREMITY TREATMENT   Patient Name: Tiffany Hanson MRN: [id number] DOB:December 07, 1945, 78 y.o., female Today's Date: 06/04/2024  END OF SESSION:  PT End of Session - 06/04/24 1523     Visit Number 2    Number of Visits 15   3x/week for 3 weeks then 1-2x/week for 3 weeks   Date for PT Re-Evaluation 07/10/24    Authorization Type [employer]    Authorization Time Period no auth    PT Start Time 1525   Late arrival   PT Stop Time 1600    PT Time Calculation (min) 35 min    Activity Tolerance Patient tolerated treatment well    Behavior During Therapy [facility] for tasks assessed/performed          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   Lichen sclerosus 05/09/2013   Osteopenia    Personal history of chemotherapy    Right breast   Personal history of radiation therapy    Right breast   S/P radiation therapy    completed in April 2010   Superficial fungus infection of skin 04/27/2015   Vaginal Pap smear, abnormal    Vitiligo    Past Surgical History:  Procedure Laterality Date   BLADDER SUSPENSION     16 to 17 years ago   BREAST LUMPECTOMY  08/20/08   HIP ARTHROPLASTY Left 02/22/2024   Procedure: HEMIARTHROPLASTY (BIPOLAR) HIP, direct lateral  APPROACH FOR FRACTURE;  Surgeon: Margrette Taft BRAVO, MD;  Location: [facility];  Service: Orthopedics;  Laterality: Left;   HYSTEROSCOPY     TUBAL LIGATION  1993   Patient Active Problem List   Diagnosis Date Noted   Hypokalemia 02/20/2024   Closed fracture of neck of left femur (HCC) 02/20/2024   Mixed hyperlipidemia 02/20/2024   Yeast infection 02/17/2020   Vaginal itching 02/17/2020   Vaginal discharge 02/17/2020   Superficial fungus infection of skin  04/27/2015   Lichen sclerosus 05/09/2013   Vitiligo 04/23/2013   Diabetes (HCC) 04/23/2013   Hypertension 04/23/2013   Leukoplakia, 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   Breast cancer (HCC) 12/16/2012    [facility]: [facility]   REFERRING PROVIDER: Margrette Taft BRAVO, MD  REFERRING DIAG: S72.002D (ICD-10-CM) - Closed fracture of neck of left femur with routine healing, subsequent encounter  THERAPY DIAG:  Weakness of left hip  Impaired functional mobility, balance, gait, and endurance  Difficulty walking  Rationale for Evaluation and Treatment: Rehabilitation  ONSET DATE: 02/09/24 fall, 02/22/24 surgery  SUBJECTIVE:   SUBJECTIVE STATEMENT: 06/04/24:  Pt arrived late, stated she was in bed and rushed to get here.  No reports of pain or recent falls.  Does have intermittent burning.  Unable to lay on Lt side for long duration.  Reports compliance with HEP 2x/daily.    Eval:  Pt states she had [place] in [employer] [place] lot and went to [place] on vacation, spent most of the time in the room and came back and found out hip was broke.  PERTINENT HISTORY: Cancer 2010  PAIN:  Are you having pain? Yes: NPRS scale: 0/10 Pain location: Left hip Pain description: burns Aggravating factors: random Relieving factors: ignoring  PRECAUTIONS: Fall  RED FLAGS: None   WEIGHT  BEARING RESTRICTIONS: No  FALLS:  Has patient fallen in last 6 months? Yes. Number of falls 1  LIVING ENVIRONMENT: Lives with: lives with their spouse Lives in: House/apartment Stairs: Yes: Internal: 13 steps; on right going up and External: 6 steps; on right going up Has following equipment at home: Single point cane and Walker - 2 wheeled  OCCUPATION: retired  PLOF: Independent with basic ADLs, Needs assistance with ADLs, and Needs assistance with homemaking  PATIENT GOALS: get off of the cane, walk better, be able to walk further too for traveling   NEXT MD VISIT: 06/26/24  OBJECTIVE:  Note:  Objective measures were completed at Evaluation unless otherwise noted.  DIAGNOSTIC FINDINGS: CLINICAL DATA:  Closed fracture.  Postoperative.   EXAM: LEFT FEMUR 2 VIEWS   COMPARISON:  Pelvis and left hip radiographs 02/20/2024   FINDINGS: Interval left hip hemiarthroplasty. No perihardware lucency is seen to indicate hardware failure or loosening. Expected postoperative changes including lateral left hip and anterior and lateral left thigh subcutaneous air. Lateral left hip surgical skin staples. Individual suture anchors again overlie the right and left pubic bodies. Mild-to-moderate atherosclerotic of the mediolateral knee compartments. Mild chronic enthesopathic change at the quadriceps and patellar tendon insertions on the patella. No acute fracture or dislocation.   IMPRESSION: Interval left hip hemiarthroplasty without evidence of hardware failure.  PATIENT SURVEYS:  LEFS : 38/80   COGNITION: Overall cognitive status: Within functional limits for tasks assessed     SENSATION: WFL  PALPATION: Pt does not demonstrate any tenderness to palpation to surgical site, left hip joint or corresponding musculature  LOWER EXTREMITY ROM:  Active ROM Right eval Left eval  Hip flexion 101 106  Hip extension    Hip abduction    Hip adduction    Hip internal rotation    Hip external rotation    Knee flexion    Knee extension    Ankle dorsiflexion    Ankle plantarflexion    Ankle inversion    Ankle eversion     (Blank rows = not tested)  LOWER EXTREMITY MMT:  MMT Right eval Left eval  Hip flexion 3+ 3-  Hip extension 2+ 2+  Hip abduction 3 3-  Hip adduction 3 3  Hip internal rotation    Hip external rotation    Knee flexion    Knee extension    Ankle dorsiflexion    Ankle plantarflexion    Ankle inversion    Ankle eversion     (Blank rows = not tested)    FUNCTIONAL TESTS:  5 times sit to stand: 19.13 seconds, BUE support 2 minute walk test: 274  feet, SPC  GAIT: Distance walked: 300 feet Assistive device utilized: Single point cane Level of assistance: Modified independence Comments: Pt demonstrates trendelenburg gait pattern which is help significantly with SPC. Pt demonstrates decreased gait speed and decreased stance time on LLE.  TREATMENT DATE:  06/04/24: Reviewed goals Educated importance of HEP compliance for maximal benefits -STS 10 no HHA, eccentric control Sidelying: -clam with RTB (cueing to reduce rolling) Supine: -Bridge 10x 5 Standing: -SPC gait training to improve heel to toe mechanics and equal weight bearing. X 73ft -Rockerboard 1 min Df/Pf then lateral  -Abduction 2x 10 with cueing for posture -Extension 2x 10 with cueing for form/mechanics -Partial tandem stance 2x 30   05/29/2024   Evaluation: -ROM measured, Strength assessed, HEP prescribed, pt educated on prognosis, findings, and importance of HEP compliance if given.     PATIENT EDUCATION:  Education details: Pt was educated on findings of PT evaluation, prognosis, frequency of therapy visits and rationale, attendance policy, and HEP if given.   Person educated: Patient Education method: Explanation, Verbal cues, and Handouts Education comprehension: verbalized understanding, verbal cues required, and needs further education  HOME EXERCISE PROGRAM: Access Code: [email or web address] URL: [email or web address] Date: 05/29/2024 Prepared by: Lang Ada  Exercises - Supine Bridge  - 1 x daily - 7 x weekly - 3 sets - 10 reps - Clamshell with Resistance  - 1 x daily - 7 x weekly - 3 sets - 10 reps - Sit to Stand with Arms Crossed  - 1 x daily - 7 x weekly - 3 sets - 10 reps  06/04/24 - Standing Hip Abduction with Counter Support  - 2 x daily - 7 x weekly - 2 sets - 10 reps - Standing Hip Extension with Counter  Support  - 2 x daily - 7 x weekly - 2 sets - 10 reps  ASSESSMENT:  CLINICAL IMPRESSION: 06/04/24:  Reviewed goals and educated importance of HEP compliance for maximal benefits.  Pt able to recall though did require some cueing to improve form and mechanics with clam exercises, tendency to roll backwards.  Gait training to improve mechanics with cueing for heel to toe mechanics, 2 point sequence with cane and equal weight bearing.  Progressed to standing exercises with cueing for posture and form.  Balance activities complete with intermittent HHA.  No reports of pain through session.  Added standing abd/ext to HEP with printout given.     Eval:  Patient is a 78 y.o. female who was seen today for physical therapy evaluation and treatment for S72.002D (ICD-10-CM) - Closed fracture of neck of left femur with routine healing, subsequent encounter.   Patient demonstrates abnormal burning sensation of left hip, decreased LLE strength, abnormal gait pattern, and impaired balance. Patient also demonstrates difficulty with ambulation during today's session with decreased stride length and velocity noted. Patient also demonstrates bilateral LE weakness especially abduction and extension of L hip. Patient requires education on role of PT, HEP, importance of increased physical activity. Patient would benefit from skilled physical therapy for increased endurance with ambulation, increased LE strength, and balance for improved improved independence with gait, return to higher level of function with ADLs, and progress towards therapy goals.   OBJECTIVE IMPAIRMENTS: Abnormal gait, decreased activity tolerance, decreased balance, decreased knowledge of use of DME, decreased mobility, difficulty walking, decreased strength, and pain.   ACTIVITY LIMITATIONS: carrying, lifting, squatting, transfers, and bed mobility  PARTICIPATION LIMITATIONS: meal prep, cleaning, laundry, driving, shopping, community activity, and yard  work  PERSONAL FACTORS: Age, Past/current experiences, Time since onset of injury/illness/exacerbation, and 1 comorbidity: fall history are also affecting patient's functional outcome.   REHAB POTENTIAL: Good  CLINICAL DECISION MAKING: Stable/uncomplicated  EVALUATION COMPLEXITY: Low   GOALS: Goals reviewed with patient? No  SHORT TERM GOALS: Target date: 06/19/24  Patient will demonstrate evidence of independence with individualized HEP and will report compliance for at least 3 days per week for optimized progression towards remaining therapy goals. Baseline:  Goal status: INITIAL  2.  Patient will report a decrease in pain level during community ambulation by at least 2 points for improved quality of life. Baseline: 5/10 Goal status: INITIAL     LONG TERM GOALS: Target date: 07/10/24  Pt will demonstrate a an increase of at least 9 points on the LEFS for improved performance of community ambulation and ADL. Baseline: see objective Goal status: INITIAL  2.  Pt will improve 2 MWT by 40 feet with no AD in order to demonstrate improved functional ambulatory capacity in community setting.  Baseline: see objective Goal status: INITIAL  3.  Pt will demonstrate WFL gait pattern with no AD and no trendelenburg pattern, for increased mobility and maximal efficiency of gait cycle during ambulation. Baseline: must have SPC to avoid abnormal gait pattern Goal status: INITIAL  4.  Pt will demonstrate at least 4/5 MMT for right lower extremity for increased strength during ADL and community ambulation. Baseline: see objective Goal status: INITIAL  5.  Pt will improve 5TSTS time by at least 5 seconds in order to improve strength during functional activities. Baseline: see objective Goal status: INITIAL    PLAN:  PT FREQUENCY: 3x per week and then 1-2 times per week  PT DURATION: 6 weeks  PLANNED INTERVENTIONS: 97110-Therapeutic exercises, 97530- Therapeutic activity, 97112-  Neuromuscular re-education, 97535- Self Care, [id number]- Manual therapy, [phone]- Gait training, Patient/Family education, Balance training, Stair training, Joint mobilization, and DME instructions  PLAN FOR NEXT SESSION: progress proximal LE strengthening (focus on glute max and medius), progress balance and functional strength to pt toleration   Augustin Mclean, LPTA/CLT; [facility] [phone]  4:33 PM, 06/04/24

## 2024-06-06 ENCOUNTER — Ambulatory Visit (HOSPITAL_COMMUNITY)

## 2024-06-06 ENCOUNTER — Encounter (HOSPITAL_COMMUNITY): Payer: Self-pay

## 2024-06-06 DIAGNOSIS — R262 Difficulty in walking, not elsewhere classified: Secondary | ICD-10-CM

## 2024-06-06 DIAGNOSIS — R29898 Other symptoms and signs involving the musculoskeletal system: Secondary | ICD-10-CM

## 2024-06-06 DIAGNOSIS — Z7409 Other reduced mobility: Secondary | ICD-10-CM

## 2024-06-06 NOTE — Therapy (Signed)
 OUTPATIENT PHYSICAL THERAPY LOWER EXTREMITY TREATMENT   Patient Name: Tiffany Hanson MRN: [id number] DOB:1946/10/29, 78 y.o., female Today's Date: 06/06/2024  END OF SESSION:  PT End of Session - 06/06/24 1258     Visit Number 3    Number of Visits 15    Date for PT Re-Evaluation 07/10/24    Authorization Type [employer]    Authorization Time Period no auth    PT Start Time 1301    PT Stop Time 1344    PT Time Calculation (min) 43 min    Activity Tolerance Patient tolerated treatment well    Behavior During Therapy [facility] for tasks assessed/performed          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   Lichen sclerosus 05/09/2013   Osteopenia    Personal history of chemotherapy    Right breast   Personal history of radiation therapy    Right breast   S/P radiation therapy    completed in April 2010   Superficial fungus infection of skin 04/27/2015   Vaginal Pap smear, abnormal    Vitiligo    Past Surgical History:  Procedure Laterality Date   BLADDER SUSPENSION     16 to 17 years ago   BREAST LUMPECTOMY  08/20/08   HIP ARTHROPLASTY Left 02/22/2024   Procedure: HEMIARTHROPLASTY (BIPOLAR) HIP, direct lateral  APPROACH FOR FRACTURE;  Surgeon: Margrette Taft BRAVO, MD;  Location: [facility];  Service: Orthopedics;  Laterality: Left;   HYSTEROSCOPY     TUBAL LIGATION  1993   Patient Active Problem List   Diagnosis Date Noted   Hypokalemia 02/20/2024   Closed fracture of neck of left femur (HCC) 02/20/2024   Mixed hyperlipidemia 02/20/2024   Yeast infection 02/17/2020   Vaginal itching 02/17/2020   Vaginal discharge 02/17/2020   Superficial fungus infection of skin 04/27/2015   Lichen sclerosus 05/09/2013   Vitiligo 04/23/2013    Diabetes (HCC) 04/23/2013   Hypertension 04/23/2013   Leukoplakia, 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   Breast cancer (HCC) 12/16/2012    [facility]P: [facility]   REFERRING PROVIDER: Margrette Taft BRAVO, MD  REFERRING DIAG: S72.002D (ICD-10-CM) - Closed fracture of neck of left femur with routine healing, subsequent encounter  THERAPY DIAG:  Weakness of left hip  Impaired functional mobility, balance, gait, and endurance  Difficulty walking  Rationale for Evaluation and Treatment: Rehabilitation  ONSET DATE: 02/09/24 fall, 02/22/24 surgery  SUBJECTIVE:   SUBJECTIVE STATEMENT: 06/06/24:  Pt stated she has some soreness in Lt thigh, pain scale 5/10.  Has applied vapor rub.  Has been compliant with HEP daily.  Eval:  Pt states she had fallen in [place] parking lot and went to [place] on vacation, spent most of the time in the room and came back and found out hip was broke.  PERTINENT HISTORY: Cancer 2010  PAIN:  Are you having pain? Yes: NPRS scale: 0/10 Pain location: Left hip Pain description: burns Aggravating factors: random Relieving factors: ignoring  PRECAUTIONS: Fall  RED FLAGS: None   WEIGHT BEARING RESTRICTIONS: No  FALLS:  Has patient fallen in last 6 months? Yes. Number of falls 1  LIVING ENVIRONMENT: Lives with: lives with their spouse Lives in: House/apartment Stairs: Yes: Internal:  13 steps; on right going up and External: 6 steps; on right going up Has following equipment at home: Single point cane and Walker - 2 wheeled  OCCUPATION: retired  PLOF: Independent with basic ADLs, Needs assistance with ADLs, and Needs assistance with homemaking  PATIENT GOALS: get off of the cane, walk better, be able to walk further too for traveling   NEXT MD VISIT: 06/26/24  OBJECTIVE:  Note: Objective measures were completed at Evaluation unless otherwise noted.  DIAGNOSTIC FINDINGS: CLINICAL DATA:  Closed fracture.  Postoperative.   EXAM: LEFT FEMUR 2  VIEWS   COMPARISON:  Pelvis and left hip radiographs 02/20/2024   FINDINGS: Interval left hip hemiarthroplasty. No perihardware lucency is seen to indicate hardware failure or loosening. Expected postoperative changes including lateral left hip and anterior and lateral left thigh subcutaneous air. Lateral left hip surgical skin staples. Individual suture anchors again overlie the right and left pubic bodies. Mild-to-moderate atherosclerotic of the mediolateral knee compartments. Mild chronic enthesopathic change at the quadriceps and patellar tendon insertions on the patella. No acute fracture or dislocation.   IMPRESSION: Interval left hip hemiarthroplasty without evidence of hardware failure.  PATIENT SURVEYS:  LEFS : 38/80   COGNITION: Overall cognitive status: Within functional limits for tasks assessed     SENSATION: WFL  PALPATION: Pt does not demonstrate any tenderness to palpation to surgical site, left hip joint or corresponding musculature  LOWER EXTREMITY ROM:  Active ROM Right eval Left eval  Hip flexion 101 106  Hip extension    Hip abduction    Hip adduction    Hip internal rotation    Hip external rotation    Knee flexion    Knee extension    Ankle dorsiflexion    Ankle plantarflexion    Ankle inversion    Ankle eversion     (Blank rows = not tested)  LOWER EXTREMITY MMT:  MMT Right eval Left eval  Hip flexion 3+ 3-  Hip extension 2+ 2+  Hip abduction 3 3-  Hip adduction 3 3  Hip internal rotation    Hip external rotation    Knee flexion    Knee extension    Ankle dorsiflexion    Ankle plantarflexion    Ankle inversion    Ankle eversion     (Blank rows = not tested)    FUNCTIONAL TESTS:  5 times sit to stand: 19.13 seconds, BUE support 2 minute walk test: 274 feet, SPC  GAIT: Distance walked: 300 feet Assistive device utilized: Single point cane Level of assistance: Modified independence Comments: Pt demonstrates  trendelenburg gait pattern which is help significantly with SPC. Pt demonstrates decreased gait speed and decreased stance time on LLE.                                                                                                                                TREATMENT DATE:  06/06/24 [place] UE/LE x  5' Standing: -Heel raises incline slope - Toe raises decline slope -Abduction 15 -Extension 15 -Tandem stance  Supine: LLD from ASIS to medial condyle  Rt 21.5 in, Lt 19in -Bridge with RTB around thigh Sidelying: - Clam with RTB  06/04/24: Reviewed goals Educated importance of HEP compliance for maximal benefits -STS 10 no HHA, eccentric control Sidelying: -clam with RTB (cueing to reduce rolling) Supine: -Bridge 10x 5 Standing: -SPC gait training to improve heel to toe mechanics and equal weight bearing. X 87ft -Rockerboard 1 min Df/Pf then lateral  -Abduction 2x 10 with cueing for posture -Extension 2x 10 with cueing for form/mechanics -Partial tandem stance 2x 30   05/29/2024   Evaluation: -ROM measured, Strength assessed, HEP prescribed, pt educated on prognosis, findings, and importance of HEP compliance if given.     PATIENT EDUCATION:  Education details: Pt was educated on findings of PT evaluation, prognosis, frequency of therapy visits and rationale, attendance policy, and HEP if given.   Person educated: Patient Education method: Explanation, Verbal cues, and Handouts Education comprehension: verbalized understanding, verbal cues required, and needs further education  HOME EXERCISE PROGRAM: Access Code: [email or web address] URL: [email or web address] Date: 05/29/2024 Prepared by: Lang Ada  Exercises - Supine Bridge  - 1 x daily - 7 x weekly - 3 sets - 10 reps - Clamshell with Resistance  - 1 x daily - 7 x weekly - 3 sets - 10 reps - Sit to Stand with Arms Crossed  - 1 x daily - 7 x weekly - 3 sets - 10 reps  06/04/24 - Standing Hip  Abduction with Counter Support  - 2 x daily - 7 x weekly - 2 sets - 10 reps - Standing Hip Extension with Counter Support  - 2 x daily - 7 x weekly - 2 sets - 10 reps  ASSESSMENT:  CLINICAL IMPRESSION: 06/06/24:  Session focus with hip strengthening and balance activities for stability that was tolerated well.  Noted leg length discrepancy during sidestep and pt stated her surgeon mentioned there would be some difference in length.  Educated benefits of additonal heel support to assist with the LLD, measurements taken with ~2in difference.  Encouraged to begin with 1/4in support under heel.  No reports of pain through session, was limited by fatigue at appropriate levels.   Eval:  Patient is a 78 y.o. female who was seen today for physical therapy evaluation and treatment for S72.002D (ICD-10-CM) - Closed fracture of neck of left femur with routine healing, subsequent encounter.   Patient demonstrates abnormal burning sensation of left hip, decreased LLE strength, abnormal gait pattern, and impaired balance. Patient also demonstrates difficulty with ambulation during today's session with decreased stride length and velocity noted. Patient also demonstrates bilateral LE weakness especially abduction and extension of L hip. Patient requires education on role of PT, HEP, importance of increased physical activity. Patient would benefit from skilled physical therapy for increased endurance with ambulation, increased LE strength, and balance for improved improved independence with gait, return to higher level of function with ADLs, and progress towards therapy goals.   OBJECTIVE IMPAIRMENTS: Abnormal gait, decreased activity tolerance, decreased balance, decreased knowledge of use of DME, decreased mobility, difficulty walking, decreased strength, and pain.   ACTIVITY LIMITATIONS: carrying, lifting, squatting, transfers, and bed mobility  PARTICIPATION LIMITATIONS: meal prep, cleaning, laundry, driving,  shopping, community activity, and yard work  PERSONAL FACTORS: Age, Past/current experiences, Time since onset of injury/illness/exacerbation, and 1 comorbidity: fall history are also affecting patient's functional outcome.  REHAB POTENTIAL: Good  CLINICAL DECISION MAKING: Stable/uncomplicated  EVALUATION COMPLEXITY: Low   GOALS: Goals reviewed with patient? No  SHORT TERM GOALS: Target date: 06/19/24  Patient will demonstrate evidence of independence with individualized HEP and will report compliance for at least 3 days per week for optimized progression towards remaining therapy goals. Baseline:  Goal status: INITIAL  2.  Patient will report a decrease in pain level during community ambulation by at least 2 points for improved quality of life. Baseline: 5/10 Goal status: INITIAL     LONG TERM GOALS: Target date: 07/10/24  Pt will demonstrate a an increase of at least 9 points on the LEFS for improved performance of community ambulation and ADL. Baseline: see objective Goal status: INITIAL  2.  Pt will improve 2 MWT by 40 feet with no AD in order to demonstrate improved functional ambulatory capacity in community setting.  Baseline: see objective Goal status: INITIAL  3.  Pt will demonstrate WFL gait pattern with no AD and no trendelenburg pattern, for increased mobility and maximal efficiency of gait cycle during ambulation. Baseline: must have SPC to avoid abnormal gait pattern Goal status: INITIAL  4.  Pt will demonstrate at least 4/5 MMT for right lower extremity for increased strength during ADL and community ambulation. Baseline: see objective Goal status: INITIAL  5.  Pt will improve 5TSTS time by at least 5 seconds in order to improve strength during functional activities. Baseline: see objective Goal status: INITIAL    PLAN:  PT FREQUENCY: 3x per week and then 1-2 times per week  PT DURATION: 6 weeks  PLANNED INTERVENTIONS: 97110-Therapeutic  exercises, 97530- Therapeutic activity, 97112- Neuromuscular re-education, 97535- Self Care, [id number]- Manual therapy, [phone]- Gait training, Patient/Family education, Balance training, Stair training, Joint mobilization, and DME instructions  PLAN FOR NEXT SESSION: progress proximal LE strengthening (focus on glute max and medius), progress balance and functional strength to pt toleration   Augustin Mclean, LPTA/CLT; CBIS [phone]  1:57 PM, 06/06/24

## 2024-06-10 ENCOUNTER — Ambulatory Visit (HOSPITAL_COMMUNITY)

## 2024-06-10 ENCOUNTER — Encounter (HOSPITAL_COMMUNITY): Payer: Self-pay

## 2024-06-10 DIAGNOSIS — Z7409 Other reduced mobility: Secondary | ICD-10-CM

## 2024-06-10 DIAGNOSIS — R262 Difficulty in walking, not elsewhere classified: Secondary | ICD-10-CM

## 2024-06-10 DIAGNOSIS — R29898 Other symptoms and signs involving the musculoskeletal system: Secondary | ICD-10-CM | POA: Diagnosis not present

## 2024-06-10 NOTE — Therapy (Signed)
 OUTPATIENT PHYSICAL THERAPY LOWER EXTREMITY TREATMENT   Patient Name: LORRIANE DEHART MRN: [id number] DOB:May 08, 1946, 78 y.o., female Today's Date: 06/10/2024  END OF SESSION:  PT End of Session - 06/10/24 1303     Visit Number 4    Number of Visits 15    Date for PT Re-Evaluation 07/10/24    Authorization Type [employer]    Authorization Time Period no auth    PT Start Time 1303    PT Stop Time 1342    PT Time Calculation (min) 39 min    Activity Tolerance Patient tolerated treatment well    Behavior During Therapy [facility] for tasks assessed/performed          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   Lichen sclerosus 05/09/2013   Osteopenia    Personal history of chemotherapy    Right breast   Personal history of radiation therapy    Right breast   S/P radiation therapy    completed in April 2010   Superficial fungus infection of skin 04/27/2015   Vaginal Pap smear, abnormal    Vitiligo    Past Surgical History:  Procedure Laterality Date   BLADDER SUSPENSION     16 to 17 years ago   BREAST LUMPECTOMY  08/20/08   HIP ARTHROPLASTY Left 02/22/2024   Procedure: HEMIARTHROPLASTY (BIPOLAR) HIP, direct lateral  APPROACH FOR FRACTURE;  Surgeon: Margrette Taft BRAVO, MD;  Location: [facility];  Service: Orthopedics;  Laterality: Left;   HYSTEROSCOPY     TUBAL LIGATION  1993   Patient Active Problem List   Diagnosis Date Noted   Hypokalemia 02/20/2024   Closed fracture of neck of left femur (HCC) 02/20/2024   Mixed hyperlipidemia 02/20/2024   Yeast infection 02/17/2020   Vaginal itching 02/17/2020   Vaginal discharge 02/17/2020   Superficial fungus infection of skin 04/27/2015   Lichen sclerosus 05/09/2013   Vitiligo 04/23/2013    Diabetes (HCC) 04/23/2013   Hypertension 04/23/2013   Leukoplakia, 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   Breast cancer (HCC) 12/16/2012    [facility]: [facility]   REFERRING PROVIDER: Margrette Taft BRAVO, MD  REFERRING DIAG: S72.002D (ICD-10-CM) - Closed fracture of neck of left femur with routine healing, subsequent encounter  THERAPY DIAG:  Weakness of left hip  Impaired functional mobility, balance, gait, and endurance  Difficulty walking  Rationale for Evaluation and Treatment: Rehabilitation  ONSET DATE: 02/09/24 fall, 02/22/24 surgery  SUBJECTIVE:   SUBJECTIVE STATEMENT: 06/10/24:  Pt arrived with additional 1/4in support in heel.  No reports of pain today, exercises going well at home.  Eval:  Pt states she had fallen in [place] parking lot and went to [place] on vacation, spent most of the time in the room and came back and found out hip was broke.  PERTINENT HISTORY: Cancer 2010  PAIN:  Are you having pain? Yes: NPRS scale: 0/10 Pain location: Left hip Pain description: burns Aggravating factors: random Relieving factors: ignoring  PRECAUTIONS: Fall  RED FLAGS: None   WEIGHT BEARING RESTRICTIONS: No  FALLS:  Has patient fallen in last 6 months? Yes. Number of falls 1  LIVING ENVIRONMENT: Lives with: lives with their spouse Lives in: House/apartment Stairs: Yes: Internal: 13 steps; on right going  up and External: 6 steps; on right going up Has following equipment at home: Single point cane and Walker - 2 wheeled  OCCUPATION: retired  PLOF: Independent with basic ADLs, Needs assistance with ADLs, and Needs assistance with homemaking  PATIENT GOALS: get off of the cane, walk better, be able to walk further too for traveling   NEXT MD VISIT: 06/26/24  OBJECTIVE:  Note: Objective measures were completed at Evaluation unless otherwise noted.  DIAGNOSTIC FINDINGS: CLINICAL DATA:  Closed fracture.  Postoperative.   EXAM: LEFT FEMUR 2 VIEWS    COMPARISON:  Pelvis and left hip radiographs 02/20/2024   FINDINGS: Interval left hip hemiarthroplasty. No perihardware lucency is seen to indicate hardware failure or loosening. Expected postoperative changes including lateral left hip and anterior and lateral left thigh subcutaneous air. Lateral left hip surgical skin staples. Individual suture anchors again overlie the right and left pubic bodies. Mild-to-moderate atherosclerotic of the mediolateral knee compartments. Mild chronic enthesopathic change at the quadriceps and patellar tendon insertions on the patella. No acute fracture or dislocation.   IMPRESSION: Interval left hip hemiarthroplasty without evidence of hardware failure.  PATIENT SURVEYS:  LEFS : 38/80   COGNITION: Overall cognitive status: Within functional limits for tasks assessed     SENSATION: WFL  PALPATION: Pt does not demonstrate any tenderness to palpation to surgical site, left hip joint or corresponding musculature  LOWER EXTREMITY ROM:  Active ROM Right eval Left eval  Hip flexion 101 106  Hip extension    Hip abduction    Hip adduction    Hip internal rotation    Hip external rotation    Knee flexion    Knee extension    Ankle dorsiflexion    Ankle plantarflexion    Ankle inversion    Ankle eversion     (Blank rows = not tested)  LOWER EXTREMITY MMT:  MMT Right eval Left eval  Hip flexion 3+ 3-  Hip extension 2+ 2+  Hip abduction 3 3-  Hip adduction 3 3  Hip internal rotation    Hip external rotation    Knee flexion    Knee extension    Ankle dorsiflexion    Ankle plantarflexion    Ankle inversion    Ankle eversion     (Blank rows = not tested)    FUNCTIONAL TESTS:  5 times sit to stand: 19.13 seconds, BUE support 2 minute walk test: 274 feet, SPC  GAIT: Distance walked: 300 feet Assistive device utilized: Single point cane Level of assistance: Modified independence Comments: Pt demonstrates trendelenburg gait  pattern which is help significantly with SPC. Pt demonstrates decreased gait speed and decreased stance time on LLE.                                                                                                                                TREATMENT DATE:  06/10/24: [place] UE/LE x 5' L3 Standing:  -Toe  tapping 6in step alternating with no HHA  - Heel raises incline slope 15x  - Toe raises decline slope 15  - SLS Rt 10, Lt 4  - Tandem stance 1x 30  - Tandem stance on foam 2x 30  - Sidestep inside // bars minimal to no HHA (1st time without resistance, 2RT with RTB around thigh) 3RT total  - Forward step up 4 then 6  - Step down 6in 15x   06/06/24 [employer] UE/LE x 5' Standing: -Heel raises incline slope - Toe raises decline slope -Abduction 15 -Extension 15 -Tandem stance  Supine: LLD from ASIS to medial condyle  Rt 21.5 in, Lt 19in -Bridge with RTB around thigh Sidelying: - Clam with RTB  06/04/24: Reviewed goals Educated importance of HEP compliance for maximal benefits -STS 10 no HHA, eccentric control Sidelying: -clam with RTB (cueing to reduce rolling) Supine: -Bridge 10x 5 Standing: -SPC gait training to improve heel to toe mechanics and equal weight bearing. X 28ft -Rockerboard 1 min Df/Pf then lateral  -Abduction 2x 10 with cueing for posture -Extension 2x 10 with cueing for form/mechanics -Partial tandem stance 2x 30   05/29/2024   Evaluation: -ROM measured, Strength assessed, HEP prescribed, pt educated on prognosis, findings, and importance of HEP compliance if given.     PATIENT EDUCATION:  Education details: Pt was educated on findings of PT evaluation, prognosis, frequency of therapy visits and rationale, attendance policy, and HEP if given.   Person educated: Patient Education method: Explanation, Verbal cues, and Handouts Education comprehension: verbalized understanding, verbal cues required, and needs  further education  HOME EXERCISE PROGRAM: Access Code: [email or web address] URL: [email or web address] Date: 05/29/2024 Prepared by: Lang Ada  Exercises - Supine Bridge  - 1 x daily - 7 x weekly - 3 sets - 10 reps - Clamshell with Resistance  - 1 x daily - 7 x weekly - 3 sets - 10 reps - Sit to Stand with Arms Crossed  - 1 x daily - 7 x weekly - 3 sets - 10 reps  06/04/24 - Standing Hip Abduction with Counter Support  - 2 x daily - 7 x weekly - 2 sets - 10 reps - Standing Hip Extension with Counter Support  - 2 x daily - 7 x weekly - 2 sets - 10 reps  06/10/24: - sidestep with theraband resistance  ASSESSMENT:  CLINICAL IMPRESSION: 06/10/24:  Session focus with hip strengthening and balance activities.  Progressed functional strengthening and balance with new exercises.  Added theraband resistance with sidestep for gluteal strengthening.  Pt required intermittent HHA during SLS based activities.  Pt wearing heel support in Lt shoe.  Cueing to improve heel strike and reduce hip circumduction during gait.  No reports of increased pain through session, was limited by fatigue at appropriate level.    Eval:  Patient is a 78 y.o. female who was seen today for physical therapy evaluation and treatment for S72.002D (ICD-10-CM) - Closed fracture of neck of left femur with routine healing, subsequent encounter.   Patient demonstrates abnormal burning sensation of left hip, decreased LLE strength, abnormal gait pattern, and impaired balance. Patient also demonstrates difficulty with ambulation during today's session with decreased stride length and velocity noted. Patient also demonstrates bilateral LE weakness especially abduction and extension of L hip. Patient requires education on role of PT, HEP, importance of increased physical activity. Patient would benefit from skilled physical therapy for increased endurance with ambulation, increased LE strength, and balance for improved improved  independence with gait, return to higher level of function with ADLs, and progress towards therapy goals.   OBJECTIVE IMPAIRMENTS: Abnormal gait, decreased activity tolerance, decreased balance, decreased knowledge of use of DME, decreased mobility, difficulty walking, decreased strength, and pain.   ACTIVITY LIMITATIONS: carrying, lifting, squatting, transfers, and bed mobility  PARTICIPATION LIMITATIONS: meal prep, cleaning, laundry, driving, shopping, community activity, and yard work  PERSONAL FACTORS: Age, Past/current experiences, Time since onset of injury/illness/exacerbation, and 1 comorbidity: fall history are also affecting patient's functional outcome.   REHAB POTENTIAL: Good  CLINICAL DECISION MAKING: Stable/uncomplicated  EVALUATION COMPLEXITY: Low   GOALS: Goals reviewed with patient? No  SHORT TERM GOALS: Target date: 06/19/24  Patient will demonstrate evidence of independence with individualized HEP and will report compliance for at least 3 days per week for optimized progression towards remaining therapy goals. Baseline:  Goal status: INITIAL  2.  Patient will report a decrease in pain level during community ambulation by at least 2 points for improved quality of life. Baseline: 5/10 Goal status: INITIAL     LONG TERM GOALS: Target date: 07/10/24  Pt will demonstrate a an increase of at least 9 points on the LEFS for improved performance of community ambulation and ADL. Baseline: see objective Goal status: INITIAL  2.  Pt will improve 2 MWT by 40 feet with no AD in order to demonstrate improved functional ambulatory capacity in community setting.  Baseline: see objective Goal status: INITIAL  3.  Pt will demonstrate WFL gait pattern with no AD and no trendelenburg pattern, for increased mobility and maximal efficiency of gait cycle during ambulation. Baseline: must have SPC to avoid abnormal gait pattern Goal status: INITIAL  4.  Pt will demonstrate at  least 4/5 MMT for right lower extremity for increased strength during ADL and community ambulation. Baseline: see objective Goal status: INITIAL  5.  Pt will improve 5TSTS time by at least 5 seconds in order to improve strength during functional activities. Baseline: see objective Goal status: INITIAL    PLAN:  PT FREQUENCY: 3x per week and then 1-2 times per week  PT DURATION: 6 weeks  PLANNED INTERVENTIONS: 97110-Therapeutic exercises, 97530- Therapeutic activity, 97112- Neuromuscular re-education, 97535- Self Care, [id number]- Manual therapy, [phone]- Gait training, Patient/Family education, Balance training, Stair training, Joint mobilization, and DME instructions  PLAN FOR NEXT SESSION: progress proximal LE strengthening (focus on glute max and medius), progress balance and functional strength to pt toleration   Augustin Mclean, LPTA/CLT; CBIS [phone]  4:30 PM, 06/10/24

## 2024-06-13 ENCOUNTER — Encounter (HOSPITAL_COMMUNITY): Payer: Self-pay

## 2024-06-13 ENCOUNTER — Ambulatory Visit (HOSPITAL_COMMUNITY)

## 2024-06-13 DIAGNOSIS — R29898 Other symptoms and signs involving the musculoskeletal system: Secondary | ICD-10-CM | POA: Diagnosis not present

## 2024-06-13 DIAGNOSIS — R262 Difficulty in walking, not elsewhere classified: Secondary | ICD-10-CM

## 2024-06-13 DIAGNOSIS — Z7409 Other reduced mobility: Secondary | ICD-10-CM

## 2024-06-13 NOTE — Therapy (Signed)
 OUTPATIENT PHYSICAL THERAPY LOWER EXTREMITY TREATMENT   Patient Name: MARIKAY ROADS MRN: [id number] DOB:July 21, 1946, 78 y.o., female Today's Date: 06/13/2024  END OF SESSION:  PT End of Session - 06/13/24 1344     Visit Number 5    Number of Visits 15    Date for PT Re-Evaluation 07/10/24    Authorization Type [employer]    Authorization Time Period no auth    PT Start Time 1344    PT Stop Time 1422    PT Time Calculation (min) 38 min    Activity Tolerance Patient tolerated treatment well    Behavior During Therapy [facility] for tasks assessed/performed           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   Lichen sclerosus 05/09/2013   Osteopenia    Personal history of chemotherapy    Right breast   Personal history of radiation therapy    Right breast   S/P radiation therapy    completed in April 2010   Superficial fungus infection of skin 04/27/2015   Vaginal Pap smear, abnormal    Vitiligo    Past Surgical History:  Procedure Laterality Date   BLADDER SUSPENSION     16 to 17 years ago   BREAST LUMPECTOMY  08/20/08   HIP ARTHROPLASTY Left 02/22/2024   Procedure: HEMIARTHROPLASTY (BIPOLAR) HIP, direct lateral  APPROACH FOR FRACTURE;  Surgeon: Margrette Taft BRAVO, MD;  Location: [facility];  Service: Orthopedics;  Laterality: Left;   HYSTEROSCOPY     TUBAL LIGATION  1993   Patient Active Problem List   Diagnosis Date Noted   Hypokalemia 02/20/2024   Closed fracture of neck of left femur (HCC) 02/20/2024   Mixed hyperlipidemia 02/20/2024   Yeast infection 02/17/2020   Vaginal itching 02/17/2020   Vaginal discharge 02/17/2020   Superficial fungus infection of skin 04/27/2015   Lichen sclerosus 05/09/2013   Vitiligo 04/23/2013    Diabetes (HCC) 04/23/2013   Hypertension 04/23/2013   Leukoplakia, 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   Breast cancer (HCC) 12/16/2012    [facility]: [facility]   REFERRING PROVIDER: Margrette Taft BRAVO, MD  REFERRING DIAG: S72.002D (ICD-10-CM) - Closed fracture of neck of left femur with routine healing, subsequent encounter  THERAPY DIAG:  Weakness of left hip  Impaired functional mobility, balance, gait, and endurance  Difficulty walking  Rationale for Evaluation and Treatment: Rehabilitation  ONSET DATE: 02/09/24 fall, 02/22/24 surgery  SUBJECTIVE:   SUBJECTIVE STATEMENT: Pt states she tried to take a epsom salt bath and hurt back a little bit. Pt states pain today is at about a 3/10.  Eval:  Pt states she had fallen in [place] parking lot and went to [place] on vacation, spent most of the time in the room and came back and found out hip was broke.  PERTINENT HISTORY: Cancer 2010  PAIN:  Are you having pain? Yes: NPRS scale: 0/10 Pain location: Left hip Pain description: burns Aggravating factors: random Relieving factors: ignoring  PRECAUTIONS: Fall  RED FLAGS: None   WEIGHT BEARING RESTRICTIONS: No  FALLS:  Has patient fallen in last 6 months? Yes. Number of falls 1  LIVING ENVIRONMENT: Lives with: lives with their spouse Lives in: House/apartment Stairs: Yes: Internal:  13 steps; on right going up and External: 6 steps; on right going up Has following equipment at home: Single point cane and Walker - 2 wheeled  OCCUPATION: retired  PLOF: Independent with basic ADLs, Needs assistance with ADLs, and Needs assistance with homemaking  PATIENT GOALS: get off of the cane, walk better, be able to walk further too for traveling   NEXT MD VISIT: 06/26/24  OBJECTIVE:  Note: Objective measures were completed at Evaluation unless otherwise noted.  DIAGNOSTIC FINDINGS: CLINICAL DATA:  Closed fracture.  Postoperative.   EXAM: LEFT FEMUR 2 VIEWS    COMPARISON:  Pelvis and left hip radiographs 02/20/2024   FINDINGS: Interval left hip hemiarthroplasty. No perihardware lucency is seen to indicate hardware failure or loosening. Expected postoperative changes including lateral left hip and anterior and lateral left thigh subcutaneous air. Lateral left hip surgical skin staples. Individual suture anchors again overlie the right and left pubic bodies. Mild-to-moderate atherosclerotic of the mediolateral knee compartments. Mild chronic enthesopathic change at the quadriceps and patellar tendon insertions on the patella. No acute fracture or dislocation.   IMPRESSION: Interval left hip hemiarthroplasty without evidence of hardware failure.  PATIENT SURVEYS:  LEFS : 38/80   COGNITION: Overall cognitive status: Within functional limits for tasks assessed     SENSATION: WFL  PALPATION: Pt does not demonstrate any tenderness to palpation to surgical site, left hip joint or corresponding musculature  LOWER EXTREMITY ROM:  Active ROM Right eval Left eval  Hip flexion 101 106  Hip extension    Hip abduction    Hip adduction    Hip internal rotation    Hip external rotation    Knee flexion    Knee extension    Ankle dorsiflexion    Ankle plantarflexion    Ankle inversion    Ankle eversion     (Blank rows = not tested)  LOWER EXTREMITY MMT:  MMT Right eval Left eval  Hip flexion 3+ 3-  Hip extension 2+ 2+  Hip abduction 3 3-  Hip adduction 3 3  Hip internal rotation    Hip external rotation    Knee flexion    Knee extension    Ankle dorsiflexion    Ankle plantarflexion    Ankle inversion    Ankle eversion     (Blank rows = not tested)    FUNCTIONAL TESTS:  5 times sit to stand: 19.13 seconds, BUE support 2 minute walk test: 274 feet, SPC  GAIT: Distance walked: 300 feet Assistive device utilized: Single point cane Level of assistance: Modified independence Comments: Pt demonstrates trendelenburg gait  pattern which is help significantly with SPC. Pt demonstrates decreased gait speed and decreased stance time on LLE.                                                                                                                                TREATMENT DATE:  06/13/2024  Therapeutic Exercise: -Stationary bike,  5 minutes, level 3 resistance, pt cued for increased pace -Lateral stepping 3 laps 10 steps per lap, with RTB around ankles, pt cued for upright posture -Leg press, 2 sets of 10 reps, plate 3>plate 4, pt cued for avoidance of knee lock out Neuromuscular Re-education: -Standing hip hike, 1 set of 10 reps, pt cued to remain in pain free ROM -Walking marches and butt kicks, 3 lb ankle weights, 1 lap on 20 foot lap, pt cued for increased LE ROM Therapeutic Activity: -Sit to stands, 2 sets of 6 reps, pt cued for core activation -Step up and overs, 1 set for 7 reps, 8 inch step, pt cued for alternating leading LE and for decreased UE support -Lateral step up and overs, 1 set of 7 reps, 8 inch step, pt cued for decreased UE support   06/10/24: [place] UE/LE x 5' L3 Standing:  -Toe tapping 6in step alternating with no HHA  - Heel raises incline slope 15x  - Toe raises decline slope 15  - SLS Rt 10, Lt 4  - Tandem stance 1x 30  - Tandem stance on foam 2x 30  - Sidestep inside // bars minimal to no HHA (1st time without resistance, 2RT with RTB around thigh) 3RT total  - Forward step up 4 then 6  - Step down 6in 15x   06/06/24 [employer] UE/LE x 5' Standing: -Heel raises incline slope - Toe raises decline slope -Abduction 15 -Extension 15 -Tandem stance  Supine: LLD from ASIS to medial condyle  Rt 21.5 in, Lt 19in -Bridge with RTB around thigh Sidelying: - Clam with RTB     PATIENT EDUCATION:  Education details: Pt was educated on findings of PT evaluation, prognosis, frequency of therapy visits and rationale, attendance policy, and HEP if  given.   Person educated: Patient Education method: Explanation, Verbal cues, and Handouts Education comprehension: verbalized understanding, verbal cues required, and needs further education  HOME EXERCISE PROGRAM: Access Code: [email or web address] URL: [email or web address] Date: 05/29/2024 Prepared by: Lang Ada  Exercises - Supine Bridge  - 1 x daily - 7 x weekly - 3 sets - 10 reps - Clamshell with Resistance  - 1 x daily - 7 x weekly - 3 sets - 10 reps - Sit to Stand with Arms Crossed  - 1 x daily - 7 x weekly - 3 sets - 10 reps  06/04/24 - Standing Hip Abduction with Counter Support  - 2 x daily - 7 x weekly - 2 sets - 10 reps - Standing Hip Extension with Counter Support  - 2 x daily - 7 x weekly - 2 sets - 10 reps  06/10/24: - sidestep with theraband resistance  ASSESSMENT:  CLINICAL IMPRESSION: Patient continues to demonstrate decreased LLE strength, decreased gait quality and balance. Patient also demonstrates abiility to correct trendelenberg gait with verbal cueing for about 4 gait cycles before retunring to abnormal gait pattern due to left hip abductor weakness. Patient able to progress dynamic balance and core activation exercises today with step up variations and resisted walking, good performance with verbal cueing. Patient would continue to benefit from skilled physical therapy for increased endurance with ambulation, increased LLE strength, and improved balance for improved quality of life, improved independence with gait training and continued progress towards therapy goals.    Eval:  Patient is a 78 y.o. female who was seen today for physical therapy evaluation and treatment for S72.002D (ICD-10-CM) - Closed fracture of neck of left femur with  routine healing, subsequent encounter. Patient demonstrates abnormal burning sensation of left hip, decreased LLE strength, abnormal gait pattern, and impaired balance. Patient also demonstrates difficulty with ambulation  during today's session with decreased stride length and velocity noted. Patient also demonstrates bilateral LE weakness especially abduction and extension of L hip. Patient requires education on role of PT, HEP, importance of increased physical activity. Patient would benefit from skilled physical therapy for increased endurance with ambulation, increased LE strength, and balance for improved improved independence with gait, return to higher level of function with ADLs, and progress towards therapy goals.   OBJECTIVE IMPAIRMENTS: Abnormal gait, decreased activity tolerance, decreased balance, decreased knowledge of use of DME, decreased mobility, difficulty walking, decreased strength, and pain.   ACTIVITY LIMITATIONS: carrying, lifting, squatting, transfers, and bed mobility  PARTICIPATION LIMITATIONS: meal prep, cleaning, laundry, driving, shopping, community activity, and yard work  PERSONAL FACTORS: Age, Past/current experiences, Time since onset of injury/illness/exacerbation, and 1 comorbidity: fall history are also affecting patient's functional outcome.   REHAB POTENTIAL: Good  CLINICAL DECISION MAKING: Stable/uncomplicated  EVALUATION COMPLEXITY: Low   GOALS: Goals reviewed with patient? No  SHORT TERM GOALS: Target date: 06/19/24  Patient will demonstrate evidence of independence with individualized HEP and will report compliance for at least 3 days per week for optimized progression towards remaining therapy goals. Baseline:  Goal status: INITIAL  2.  Patient will report a decrease in pain level during community ambulation by at least 2 points for improved quality of life. Baseline: 5/10 Goal status: INITIAL     LONG TERM GOALS: Target date: 07/10/24  Pt will demonstrate a an increase of at least 9 points on the LEFS for improved performance of community ambulation and ADL. Baseline: see objective Goal status: INITIAL  2.  Pt will improve 2 MWT by 40 feet with no AD  in order to demonstrate improved functional ambulatory capacity in community setting.  Baseline: see objective Goal status: INITIAL  3.  Pt will demonstrate WFL gait pattern with no AD and no trendelenburg pattern, for increased mobility and maximal efficiency of gait cycle during ambulation. Baseline: must have SPC to avoid abnormal gait pattern Goal status: INITIAL  4.  Pt will demonstrate at least 4/5 MMT for right lower extremity for increased strength during ADL and community ambulation. Baseline: see objective Goal status: INITIAL  5.  Pt will improve 5TSTS time by at least 5 seconds in order to improve strength during functional activities. Baseline: see objective Goal status: INITIAL    PLAN:  PT FREQUENCY: 3x per week and then 1-2 times per week  PT DURATION: 6 weeks  PLANNED INTERVENTIONS: 97110-Therapeutic exercises, 97530- Therapeutic activity, 97112- Neuromuscular re-education, 97535- Self Care, [id number]- Manual therapy, [phone]- Gait training, Patient/Family education, Balance training, Stair training, Joint mobilization, and DME instructions  PLAN FOR NEXT SESSION: progress proximal LE strengthening (focus on glute max and medius), progress balance and functional strength to pt toleration, progress gait training to avoid trendelenburg pattern.    Jahliyah Trice, PT, DPT [facility] Office: [phone] 2:30 PM, 06/13/24

## 2024-06-25 ENCOUNTER — Ambulatory Visit (HOSPITAL_COMMUNITY): Admitting: Physical Therapy

## 2024-06-25 DIAGNOSIS — Z7409 Other reduced mobility: Secondary | ICD-10-CM

## 2024-06-25 DIAGNOSIS — R29898 Other symptoms and signs involving the musculoskeletal system: Secondary | ICD-10-CM | POA: Diagnosis not present

## 2024-06-25 DIAGNOSIS — R262 Difficulty in walking, not elsewhere classified: Secondary | ICD-10-CM

## 2024-06-25 NOTE — Therapy (Signed)
 OUTPATIENT PHYSICAL THERAPY LOWER EXTREMITY TREATMENT   Patient Name: Tiffany Hanson MRN: [id number] DOB:29-Aug-1946, 78 y.o., female Today's Date: 06/25/2024  END OF SESSION:  PT End of Session - 06/25/24 1607     Visit Number 6    Number of Visits 15    Date for PT Re-Evaluation 07/10/24    Authorization Type [employer]    Authorization Time Period no auth    PT Start Time 1518    PT Stop Time 1602    PT Time Calculation (min) 44 min    Activity Tolerance Patient tolerated treatment well    Behavior During Therapy [facility] for tasks assessed/performed            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   Lichen sclerosus 05/09/2013   Osteopenia    Personal history of chemotherapy    Right breast   Personal history of radiation therapy    Right breast   S/P radiation therapy    completed in April 2010   Superficial fungus infection of skin 04/27/2015   Vaginal Pap smear, abnormal    Vitiligo    Past Surgical History:  Procedure Laterality Date   BLADDER SUSPENSION     16 to 17 years ago   BREAST LUMPECTOMY  08/20/08   HIP ARTHROPLASTY Left 02/22/2024   Procedure: HEMIARTHROPLASTY (BIPOLAR) HIP, direct lateral  APPROACH FOR FRACTURE;  Surgeon: Margrette Taft BRAVO, MD;  Location: [facility];  Service: Orthopedics;  Laterality: Left;   HYSTEROSCOPY     TUBAL LIGATION  1993   Patient Active Problem List   Diagnosis Date Noted   Hypokalemia 02/20/2024   Closed fracture of neck of left femur (HCC) 02/20/2024   Mixed hyperlipidemia 02/20/2024   Yeast infection 02/17/2020   Vaginal itching 02/17/2020   Vaginal discharge 02/17/2020   Superficial fungus infection of skin 04/27/2015   Lichen sclerosus 05/09/2013   Vitiligo 04/23/2013    Diabetes (HCC) 04/23/2013   Hypertension 04/23/2013   Leukoplakia, 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   Breast cancer (HCC) 12/16/2012    [facility]: [facility]   REFERRING PROVIDER: Margrette Taft BRAVO, MD  REFERRING DIAG: S72.002D (ICD-10-CM) - Closed fracture of neck of left femur with routine healing, subsequent encounter  THERAPY DIAG:  Weakness of left hip  Impaired functional mobility, balance, gait, and endurance  Difficulty walking  Rationale for Evaluation and Treatment: Rehabilitation  ONSET DATE: 02/09/24 fall, 02/22/24 surgery  SUBJECTIVE:   SUBJECTIVE STATEMENT: Pt states she has not had any pain for a while and is doing the HEP as instructed.    Eval:  Pt states she had [place] in [place] and went to [place] on vacation, spent most of the time in the room and came back and found out hip was broke.  PERTINENT HISTORY: Cancer 2010  PAIN:  Are you having pain? No  PRECAUTIONS: Fall  RED FLAGS: None   WEIGHT BEARING RESTRICTIONS: No  FALLS:  Has patient fallen in last 6 months? Yes. Number of falls 1  LIVING ENVIRONMENT: Lives with: lives with their spouse Lives in: House/apartment Stairs: Yes: Internal: 13 steps; on right going up and External: 6 steps; on right going up Has following equipment at home: Single  point cane and Walker - 2 wheeled  OCCUPATION: retired  PLOF: Independent with basic ADLs, Needs assistance with ADLs, and Needs assistance with homemaking  PATIENT GOALS: get off of the cane, walk better, be able to walk further too for traveling   NEXT MD VISIT: 06/26/24  OBJECTIVE:  Note: Objective measures were completed at Evaluation unless otherwise noted.  DIAGNOSTIC FINDINGS: CLINICAL DATA:  Closed fracture.  Postoperative.   EXAM: LEFT FEMUR 2 VIEWS   COMPARISON:  Pelvis and left hip radiographs 02/20/2024   FINDINGS: Interval left hip hemiarthroplasty. No perihardware lucency is seen to indicate hardware  failure or loosening. Expected postoperative changes including lateral left hip and anterior and lateral left thigh subcutaneous air. Lateral left hip surgical skin staples. Individual suture anchors again overlie the right and left pubic bodies. Mild-to-moderate atherosclerotic of the mediolateral knee compartments. Mild chronic enthesopathic change at the quadriceps and patellar tendon insertions on the patella. No acute fracture or dislocation.   IMPRESSION: Interval left hip hemiarthroplasty without evidence of hardware failure.  PATIENT SURVEYS:  LEFS : 38/80   COGNITION: Overall cognitive status: Within functional limits for tasks assessed     SENSATION: WFL  PALPATION: Pt does not demonstrate any tenderness to palpation to surgical site, left hip joint or corresponding musculature  LOWER EXTREMITY ROM:  Active ROM Right eval Left eval  Hip flexion 101 106  Hip extension    Hip abduction    Hip adduction    Hip internal rotation    Hip external rotation    Knee flexion    Knee extension    Ankle dorsiflexion    Ankle plantarflexion    Ankle inversion    Ankle eversion     (Blank rows = not tested)  LOWER EXTREMITY MMT:  MMT Right eval Left eval  Hip flexion 3+ 3-  Hip extension 2+ 2+  Hip abduction 3 3-  Hip adduction 3 3  Hip internal rotation    Hip external rotation    Knee flexion    Knee extension    Ankle dorsiflexion    Ankle plantarflexion    Ankle inversion    Ankle eversion     (Blank rows = not tested)    FUNCTIONAL TESTS:  5 times sit to stand: 19.13 seconds, BUE support 2 minute walk test: 274 feet, SPC  GAIT: Distance walked: 300 feet Assistive device utilized: Single point cane Level of assistance: Modified independence Comments: Pt demonstrates trendelenburg gait pattern which is help significantly with SPC. Pt demonstrates decreased gait speed and decreased stance time on LLE.                                                                                                                                 TREATMENT DATE:  06/25/24 Nustep 5 minutes level 4, seat 10  UE/LE [employer] leg press 4Pl 3X10  Standing:  in // with bil UE assist hip hikes  flat on ground 2X10  Step up and overs forward 6 10X  Step up and overs lateral 6 10X  High marching with 4# weights alternating 10X   Hip abduction 4# 10X each  Hip extension 4# 10X each Lateral stepping on line GTB 20 foot 2RT Marching with bottom kicks 3# ankle weights 1 RT Sit to stands no UE 10X   06/13/2024  Therapeutic Exercise: -Stationary bike, 5 minutes, level 3 resistance, pt cued for increased pace -Lateral stepping 3 laps 10 steps per lap, with RTB around ankles, pt cued for upright posture -Leg press, 2 sets of 10 reps, plate 3>plate 4, pt cued for avoidance of knee lock out Neuromuscular Re-education: -Standing hip hike, 1 set of 10 reps, pt cued to remain in pain free ROM -Walking marches and butt kicks, 3 lb ankle weights, 1 lap on 20 foot lap, pt cued for increased LE ROM Therapeutic Activity: -Sit to stands, 2 sets of 6 reps, pt cued for core activation -Step up and overs, 1 set for 7 reps, 8 inch step, pt cued for alternating leading LE and for decreased UE support -Lateral step up and overs, 1 set of 7 reps, 8 inch step, pt cued for decreased UE support   06/10/24: [place]ustep [place] UE/LE x 5' L3 Standing:  -Toe tapping 6in step alternating with no HHA  - Heel raises incline slope 15x  - Toe raises decline slope 15  - SLS Rt 10, Lt 4  - Tandem stance 1x 30  - Tandem stance on foam 2x 30  - Sidestep inside // bars minimal to no HHA (1st time without resistance, 2RT with RTB around thigh) 3RT total  - Forward step up 4 then 6  - Step down 6in 15x   06/06/24 Nustep [employer] UE/LE x 5' Standing: -Heel raises incline slope - Toe raises decline slope -Abduction 15 -Extension 15 -Tandem stance  Supine: LLD from  ASIS to medial condyle  Rt 21.5 in, Lt 19in -Bridge with RTB around thigh Sidelying: - Clam with RTB     PATIENT EDUCATION:  Education details: Pt was educated on findings of PT evaluation, prognosis, frequency of therapy visits and rationale, attendance policy, and HEP if given.   Person educated: Patient Education method: Explanation, Verbal cues, and Handouts Education comprehension: verbalized understanding, verbal cues required, and needs further education  HOME EXERCISE PROGRAM: Access Code: [email or web address] URL: [email or web address] Date: 05/29/2024 Prepared by: Lang Ada  Exercises - Supine Bridge  - 1 x daily - 7 x weekly - 3 sets - 10 reps - Clamshell with Resistance  - 1 x daily - 7 x weekly - 3 sets - 10 reps - Sit to Stand with Arms Crossed  - 1 x daily - 7 x weekly - 3 sets - 10 reps  06/04/24 - Standing Hip Abduction with Counter Support  - 2 x daily - 7 x weekly - 2 sets - 10 reps - Standing Hip Extension with Counter Support  - 2 x daily - 7 x weekly - 2 sets - 10 reps  06/10/24: - sidestep with theraband resistance  ASSESSMENT:  CLINICAL IMPRESSION: Continued with focus on improving LE strength and stabilization, specifically weak Lt glute and abductors.  Very minimal contraction with Lt hip hike  due to weakness.  Added standing hip abduction/extension and marching using 4# and increased to 4# with walking hamstring curls also.  Max cues to improve trunk stability as tends to lean into weakness.  This  activity is challenging to coordinate for pt.  Also increased to green theraband today with side stepping.  Pt required only one seated rest break today due to fatigue.  Overall pt is progressing well, improving strength.  Patient will continue to benefit from skilled physical therapy for increased endurance with ambulation, increased LLE strength, and improved balance for improved quality of life, improved independence with gait training and continued  progress towards therapy goals.    Eval:  Patient is a 78 y.o. female who was seen today for physical therapy evaluation and treatment for S72.002D (ICD-10-CM) - Closed fracture of neck of left femur with routine healing, subsequent encounter. Patient demonstrates abnormal burning sensation of left hip, decreased LLE strength, abnormal gait pattern, and impaired balance. Patient also demonstrates difficulty with ambulation during today's session with decreased stride length and velocity noted. Patient also demonstrates bilateral LE weakness especially abduction and extension of L hip. Patient requires education on role of PT, HEP, importance of increased physical activity. Patient would benefit from skilled physical therapy for increased endurance with ambulation, increased LE strength, and balance for improved improved independence with gait, return to higher level of function with ADLs, and progress towards therapy goals.   OBJECTIVE IMPAIRMENTS: Abnormal gait, decreased activity tolerance, decreased balance, decreased knowledge of use of DME, decreased mobility, difficulty walking, decreased strength, and pain.   ACTIVITY LIMITATIONS: carrying, lifting, squatting, transfers, and bed mobility  PARTICIPATION LIMITATIONS: meal prep, cleaning, laundry, driving, shopping, community activity, and yard work  PERSONAL FACTORS: Age, Past/current experiences, Time since onset of injury/illness/exacerbation, and 1 comorbidity: fall history are also affecting patient's functional outcome.   REHAB POTENTIAL: Good  CLINICAL DECISION MAKING: Stable/uncomplicated  EVALUATION COMPLEXITY: Low   GOALS: Goals reviewed with patient? No  SHORT TERM GOALS: Target date: 06/19/24  Patient will demonstrate evidence of independence with individualized HEP and will report compliance for at least 3 days per week for optimized progression towards remaining therapy goals. Baseline:  Goal status: INITIAL  2.   Patient will report a decrease in pain level during community ambulation by at least 2 points for improved quality of life. Baseline: 5/10 Goal status: INITIAL     LONG TERM GOALS: Target date: 07/10/24  Pt will demonstrate a an increase of at least 9 points on the LEFS for improved performance of community ambulation and ADL. Baseline: see objective Goal status: INITIAL  2.  Pt will improve 2 MWT by 40 feet with no AD in order to demonstrate improved functional ambulatory capacity in community setting.  Baseline: see objective Goal status: INITIAL  3.  Pt will demonstrate WFL gait pattern with no AD and no trendelenburg pattern, for increased mobility and maximal efficiency of gait cycle during ambulation. Baseline: must have SPC to avoid abnormal gait pattern Goal status: INITIAL  4.  Pt will demonstrate at least 4/5 MMT for right lower extremity for increased strength during ADL and community ambulation. Baseline: see objective Goal status: INITIAL  5.  Pt will improve 5TSTS time by at least 5 seconds in order to improve strength during functional activities. Baseline: see objective Goal status: INITIAL    PLAN:  PT FREQUENCY: 3x per week and then 1-2 times per week  PT DURATION: 6 weeks  PLANNED INTERVENTIONS: 97110-Therapeutic exercises, 97530- Therapeutic activity, 97112- Neuromuscular re-education, 97535- Self Care, [id number]- Manual therapy, [phone]- Gait training, Patient/Family education, Balance training, Stair training, Joint mobilization, and DME instructions  PLAN FOR NEXT SESSION: progress proximal LE strengthening (focus on glute  max and medius), progress balance and functional strength to pt toleration, progress gait training to avoid trendelenburg pattern.    Greig KATHEE Fuse, PTA/CLT [facility] Ph: [phone] 4:08 PM, 06/25/24

## 2024-06-26 ENCOUNTER — Ambulatory Visit (INDEPENDENT_AMBULATORY_CARE_PROVIDER_SITE_OTHER): Admitting: Orthopedic Surgery

## 2024-06-26 ENCOUNTER — Encounter: Payer: Self-pay | Admitting: Orthopedic Surgery

## 2024-06-26 DIAGNOSIS — S72002D Fracture of unspecified part of neck of left femur, subsequent encounter for closed fracture with routine healing: Secondary | ICD-10-CM | POA: Diagnosis not present

## 2024-06-26 NOTE — Progress Notes (Signed)
    Chief Complaint  Patient presents with   Post-op Follow-up    Left hip / bipolar    Encounter Diagnosis  Name Primary?   Closed fracture of neck of left femur with routine healing, subsequent encounter 02/22/24 Bipolar Yes    DOI/DOS/ Date: 02/22/24  Improved  78 year old female status post bipolar hip replacement with abductor weakness presumably from direct lateral approach.  The patient [place] and tolerated the fractured hip until she got back this may also contribute to the postop weakness in the abductors  In any event she did the therapy she is doing much better she has mild weakness in minimal abductor lurch at this point she is using the cane  She should continue exercising the hip and follow-up in 3 months

## 2024-06-26 NOTE — Progress Notes (Signed)
   There were no vitals taken for this visit.  There is no height or weight on file to calculate BMI.  No chief complaint on file.   Encounter Diagnosis  Name Primary?   Closed fracture of neck of left femur with routine healing, subsequent encounter 02/22/24 Bipolar Yes    DOI/DOS/ Date: 02/22/24  Improved

## 2024-06-27 ENCOUNTER — Ambulatory Visit (HOSPITAL_COMMUNITY)

## 2024-06-27 ENCOUNTER — Encounter (HOSPITAL_COMMUNITY): Payer: Self-pay

## 2024-06-27 DIAGNOSIS — R262 Difficulty in walking, not elsewhere classified: Secondary | ICD-10-CM

## 2024-06-27 DIAGNOSIS — R29898 Other symptoms and signs involving the musculoskeletal system: Secondary | ICD-10-CM

## 2024-06-27 DIAGNOSIS — Z7409 Other reduced mobility: Secondary | ICD-10-CM

## 2024-06-27 NOTE — Therapy (Signed)
 OUTPATIENT PHYSICAL THERAPY LOWER EXTREMITY TREATMENT   Patient Name: Tiffany Hanson MRN: [id number] DOB:1946/02/25, 78 y.o., female Today's Date: 06/27/2024  END OF SESSION:  PT End of Session - 06/27/24 1101     Visit Number 7    Number of Visits 15    Date for PT Re-Evaluation 07/10/24    Authorization Type [employer]    Authorization Time Period no auth    PT Start Time 1102    PT Stop Time 1148    PT Time Calculation (min) 46 min    Equipment Utilized During Treatment Gait belt    Activity Tolerance Patient tolerated treatment well    Behavior During Therapy WFL for tasks assessed/performed            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   Lichen sclerosus 05/09/2013   Osteopenia    Personal history of chemotherapy    Right breast   Personal history of radiation therapy    Right breast   S/P radiation therapy    completed in April 2010   Superficial fungus infection of skin 04/27/2015   Vaginal Pap smear, abnormal    Vitiligo    Past Surgical History:  Procedure Laterality Date   BLADDER SUSPENSION     16 to 17 years ago   BREAST LUMPECTOMY  08/20/08   HIP ARTHROPLASTY Left 02/22/2024   Procedure: HEMIARTHROPLASTY (BIPOLAR) HIP, direct lateral  APPROACH FOR FRACTURE;  Surgeon: Margrette Taft BRAVO, MD;  Location: [facility];  Service: Orthopedics;  Laterality: Left;   HYSTEROSCOPY     TUBAL LIGATION  1993   Patient Active Problem List   Diagnosis Date Noted   Hypokalemia 02/20/2024   Closed fracture of neck of left femur (HCC) 02/20/2024   Mixed hyperlipidemia 02/20/2024   Yeast infection 02/17/2020   Vaginal itching 02/17/2020   Vaginal discharge 02/17/2020   Superficial fungus infection of skin 04/27/2015    Lichen sclerosus 05/09/2013   Vitiligo 04/23/2013   Diabetes (HCC) 04/23/2013   Hypertension 04/23/2013   Leukoplakia, 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   Breast cancer (HCC) 12/16/2012    [facility]P: [facility]   REFERRING PROVIDER: Margrette Taft BRAVO, MD  REFERRING DIAG: S72.002D (ICD-10-CM) - Closed fracture of neck of left femur with routine healing, subsequent encounter  THERAPY DIAG:  Weakness of left hip  Impaired functional mobility, balance, gait, and endurance  Difficulty walking  Rationale for Evaluation and Treatment: Rehabilitation  ONSET DATE: 02/09/24 fall, 02/22/24 surgery  SUBJECTIVE:   SUBJECTIVE STATEMENT: Pt states she can tell improvements, has been walking around the house some without AD.  No reports of pain or recent fall.  Saw [facility] yesterday, returns 09/29/24, happy with progress but wishes to continue with hip strengthening.  Eval:  Pt states she had fallen in [employer] [place] parking lot and went to [place] on vacation, spent most of the time in the room and came back and found out hip was broke.  PERTINENT HISTORY: Cancer 2010  PAIN:  Are you having pain? No  PRECAUTIONS: Fall  RED FLAGS: None   WEIGHT BEARING RESTRICTIONS: No  FALLS:  Has patient fallen in last 6 months? Yes. Number of falls 1  LIVING ENVIRONMENT: Lives with: lives with  their spouse Lives in: House/apartment Stairs: Yes: Internal: 13 steps; on right going up and External: 6 steps; on right going up Has following equipment at home: Single point cane and Walker - 2 wheeled  OCCUPATION: retired  PLOF: Independent with basic ADLs, Needs assistance with ADLs, and Needs assistance with homemaking  PATIENT GOALS: get off of the cane, walk better, be able to walk further too for traveling   NEXT MD VISIT: 06/26/24  OBJECTIVE:  Note: Objective measures were completed at Evaluation unless otherwise noted.  DIAGNOSTIC FINDINGS: CLINICAL DATA:  Closed fracture.   Postoperative.   EXAM: LEFT FEMUR 2 VIEWS   COMPARISON:  Pelvis and left hip radiographs 02/20/2024   FINDINGS: Interval left hip hemiarthroplasty. No perihardware lucency is seen to indicate hardware failure or loosening. Expected postoperative changes including lateral left hip and anterior and lateral left thigh subcutaneous air. Lateral left hip surgical skin staples. Individual suture anchors again overlie the right and left pubic bodies. Mild-to-moderate atherosclerotic of the mediolateral knee compartments. Mild chronic enthesopathic change at the quadriceps and patellar tendon insertions on the patella. No acute fracture or dislocation.   IMPRESSION: Interval left hip hemiarthroplasty without evidence of hardware failure.  PATIENT SURVEYS:  LEFS : 38/80   COGNITION: Overall cognitive status: Within functional limits for tasks assessed     SENSATION: WFL  PALPATION: Pt does not demonstrate any tenderness to palpation to surgical site, left hip joint or corresponding musculature  LOWER EXTREMITY ROM:  Active ROM Right eval Left eval  Hip flexion 101 106  Hip extension    Hip abduction    Hip adduction    Hip internal rotation    Hip external rotation    Knee flexion    Knee extension    Ankle dorsiflexion    Ankle plantarflexion    Ankle inversion    Ankle eversion     (Blank rows = not tested)  LOWER EXTREMITY MMT:  MMT Right eval Left eval  Hip flexion 3+ 3-  Hip extension 2+ 2+  Hip abduction 3 3-  Hip adduction 3 3  Hip internal rotation    Hip external rotation    Knee flexion    Knee extension    Ankle dorsiflexion    Ankle plantarflexion    Ankle inversion    Ankle eversion     (Blank rows = not tested)    FUNCTIONAL TESTS:  5 times sit to stand: 19.13 seconds, BUE support 2 minute walk test: 274 feet, SPC  GAIT: Distance walked: 300 feet Assistive device utilized: Single point cane Level of assistance: Modified  independence Comments: Pt demonstrates trendelenburg gait pattern which is help significantly with SPC. Pt demonstrates decreased gait speed and decreased stance time on LLE.                                                                                                                                TREATMENT  DATE:  06/27/24: Nustep 5 minutes level 5, seat 10  UE/LE [place] 363ft no AD Standing in // bars with Bil UE support:  - Rockerboard: Rt/Lt then Df/PF 1' each  - 7in step height 5RT reciprocal ascend, step to and rotation descending  - Squat front of chair,cueing for mechanics  - Toe tapping 4# ankles, 2x 10; 1 HHA then no UE support  - Hip abduction 4# 10X3 holds each  - Marching with bottom kicks 4# ankle weights 1 RT  - Vector stance 3x 5 with UE support Leg press 3Pl 2x 10   06/25/24 Nustep 5 minutes level 4, seat 10  UE/LE [employer] leg press 4Pl 3X10  Standing:  in // with bil UE assist hip hikes flat on ground 2X10  Step up and overs forward 6 10X  Step up and overs lateral 6 10X  High marching with 4# weights alternating 10X   Hip abduction 4# 10X each  Hip extension 4# 10X each Lateral stepping on line GTB 20 foot 2RT Marching with bottom kicks 3# ankle weights 1 RT Sit to stands no UE 10X   06/13/2024  Therapeutic Exercise: -Stationary bike, 5 minutes, level 3 resistance, pt cued for increased pace -Lateral stepping 3 laps 10 steps per lap, with RTB around ankles, pt cued for upright posture -Leg press, 2 sets of 10 reps, plate 3>plate 4, pt cued for avoidance of knee lock out Neuromuscular Re-education: -Standing hip hike, 1 set of 10 reps, pt cued to remain in pain free ROM -Walking marches and butt kicks, 3 lb ankle weights, 1 lap on 20 foot lap, pt cued for increased LE ROM Therapeutic Activity: -Sit to stands, 2 sets of 6 reps, pt cued for core activation -Step up and overs, 1 set for 7 reps, 8 inch step, pt cued for alternating leading  LE and for decreased UE support -Lateral step up and overs, 1 set of 7 reps, 8 inch step, pt cued for decreased UE support   06/10/24: [place]ustep [place] UE/LE x 5' L3 Standing:  -Toe tapping 6in step alternating with no HHA  - Heel raises incline slope 15x  - Toe raises decline slope 15  - SLS Rt 10, Lt 4  - Tandem stance 1x 30  - Tandem stance on foam 2x 30  - Sidestep inside // bars minimal to no HHA (1st time without resistance, 2RT with RTB around thigh) 3RT total  - Forward step up 4 then 6  - Step down 6in 15x   06/06/24 Nustep [employer] UE/LE x 5' Standing: -Heel raises incline slope - Toe raises decline slope -Abduction 15 -Extension 15 -Tandem stance  Supine: LLD from ASIS to medial condyle  Rt 21.5 in, Lt 19in -Bridge with RTB around thigh Sidelying: - Clam with RTB     PATIENT EDUCATION:  Education details: Pt was educated on findings of PT evaluation, prognosis, frequency of therapy visits and rationale, attendance policy, and HEP if given.   Person educated: Patient Education method: Explanation, Verbal cues, and Handouts Education comprehension: verbalized understanding, verbal cues required, and needs further education  HOME EXERCISE PROGRAM: Access Code: [email or web address] URL: [email or web address] Date: 05/29/2024 Prepared by: Lang Ada  Exercises - Supine Bridge  - 1 x daily - 7 x weekly - 3 sets - 10 reps - Clamshell with Resistance  - 1 x daily - 7 x weekly - 3 sets - 10 reps - Sit to Stand with Arms Crossed  - 1  x daily - 7 x weekly - 3 sets - 10 reps  06/04/24 - Standing Hip Abduction with Counter Support  - 2 x daily - 7 x weekly - 2 sets - 10 reps - Standing Hip Extension with Counter Support  - 2 x daily - 7 x weekly - 2 sets - 10 reps  06/10/24: - sidestep with theraband resistance  06/27/24: - Squat with Chair Touch  - 2 x daily - 7 x weekly - 1 sets - 10 reps - Single Leg Stance  - 2 x daily - 7 x weekly - 1 sets  - 3 reps - 30 hold  ASSESSMENT:  CLINICAL IMPRESSION: complete without AD, cueing for equal leg stance phase and equal stride length.  Able to ambulate with no LOB.  Session focus with hip stability exercises and functional strengthening.  Cueing to reduce compensation with trunk leans during abduction motions due to weakness to assure proper contraction for glut med.  Added stair training with ability to ascend reciprocal pattern, presents with rotation and step to pattern descending steps, cueing to improve mechanics.  Added vector stance for hip stability with UE support to improve form.  No reports of pain through session, was limited by fatigue.    Eval:  Patient is a 78 y.o. female who was seen today for physical therapy evaluation and treatment for S72.002D (ICD-10-CM) - Closed fracture of neck of left femur with routine healing, subsequent encounter. Patient demonstrates abnormal burning sensation of left hip, decreased LLE strength, abnormal gait pattern, and impaired balance. Patient also demonstrates difficulty with ambulation during today's session with decreased stride length and velocity noted. Patient also demonstrates bilateral LE weakness especially abduction and extension of L hip. Patient requires education on role of PT, HEP, importance of increased physical activity. Patient would benefit from skilled physical therapy for increased endurance with ambulation, increased LE strength, and balance for improved improved independence with gait, return to higher level of function with ADLs, and progress towards therapy goals.   OBJECTIVE IMPAIRMENTS: Abnormal gait, decreased activity tolerance, decreased balance, decreased knowledge of use of DME, decreased mobility, difficulty walking, decreased strength, and pain.   ACTIVITY LIMITATIONS: carrying, lifting, squatting, transfers, and bed mobility  PARTICIPATION LIMITATIONS: meal prep, cleaning, laundry, driving, shopping, community  activity, and yard work  PERSONAL FACTORS: Age, Past/current experiences, Time since onset of injury/illness/exacerbation, and 1 comorbidity: fall history are also affecting patient's functional outcome.   REHAB POTENTIAL: Good  CLINICAL DECISION MAKING: Stable/uncomplicated  EVALUATION COMPLEXITY: Low   GOALS: Goals reviewed with patient? No  SHORT TERM GOALS: Target date: 06/19/24  Patient will demonstrate evidence of independence with individualized HEP and will report compliance for at least 3 days per week for optimized progression towards remaining therapy goals. Baseline:  Goal status: INITIAL  2.  Patient will report a decrease in pain level during community ambulation by at least 2 points for improved quality of life. Baseline: 5/10 Goal status: INITIAL     LONG TERM GOALS: Target date: 07/10/24  Pt will demonstrate a an increase of at least 9 points on the LEFS for improved performance of community ambulation and ADL. Baseline: see objective Goal status: INITIAL  2.  Pt will improve 2 MWT by 40 feet with no AD in order to demonstrate improved functional ambulatory capacity in community setting.  Baseline: see objective Goal status: INITIAL  3.  Pt will demonstrate WFL gait pattern with no AD and no trendelenburg pattern, for increased mobility  and maximal efficiency of gait cycle during ambulation. Baseline: must have SPC to avoid abnormal gait pattern Goal status: INITIAL  4.  Pt will demonstrate at least 4/5 MMT for right lower extremity for increased strength during ADL and community ambulation. Baseline: see objective Goal status: INITIAL  5.  Pt will improve 5TSTS time by at least 5 seconds in order to improve strength during functional activities. Baseline: see objective Goal status: INITIAL    PLAN:  PT FREQUENCY: 3x per week and then 1-2 times per week  PT DURATION: 6 weeks  PLANNED INTERVENTIONS: 97110-Therapeutic exercises, 97530-  Therapeutic activity, 97112- Neuromuscular re-education, 97535- Self Care, [id number]- Manual therapy, [phone]- Gait training, Patient/Family education, Balance training, Stair training, Joint mobilization, and DME instructions  PLAN FOR NEXT SESSION: progress proximal LE strengthening (focus on glute max and medius), progress balance and functional strength to pt toleration, progress gait training to avoid trendelenburg pattern.  Treadmill, SLS activities, functional strengthening.    Augustin Mclean, LPTA/CLT; CBIS [phone] 12:00 PM, 06/27/24

## 2024-07-02 ENCOUNTER — Ambulatory Visit (HOSPITAL_COMMUNITY): Attending: Orthopedic Surgery

## 2024-07-02 ENCOUNTER — Encounter (HOSPITAL_COMMUNITY): Payer: Self-pay

## 2024-07-02 DIAGNOSIS — R262 Difficulty in walking, not elsewhere classified: Secondary | ICD-10-CM | POA: Diagnosis not present

## 2024-07-02 DIAGNOSIS — R29898 Other symptoms and signs involving the musculoskeletal system: Secondary | ICD-10-CM | POA: Insufficient documentation

## 2024-07-02 DIAGNOSIS — Z7409 Other reduced mobility: Secondary | ICD-10-CM | POA: Diagnosis not present

## 2024-07-02 NOTE — Therapy (Signed)
 OUTPATIENT PHYSICAL THERAPY LOWER EXTREMITY TREATMENT   Patient Name: Tiffany Hanson MRN: [id number] DOB:[birth year], 78 y.o., female Today's Date: 07/02/2024  END OF SESSION:  PT End of Session - 07/02/24 1523     Visit Number 8    Number of Visits 15    Date for PT Re-Evaluation 07/10/24    Authorization Type [employer]    Authorization Time Period no auth    PT Start Time 1523    PT Stop Time 1604    PT Time Calculation (min) 41 min    Activity Tolerance Patient tolerated treatment well    Behavior During Therapy [facility] for tasks assessed/performed            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   Lichen sclerosus 05/09/2013   Osteopenia    Personal history of chemotherapy    Right breast   Personal history of radiation therapy    Right breast   S/P radiation therapy    completed in April 2010   Superficial fungus infection of skin 04/27/2015   Vaginal Pap smear, abnormal    Vitiligo    Past Surgical History:  Procedure Laterality Date   BLADDER SUSPENSION     16 to 17 years ago   BREAST LUMPECTOMY  08/20/08   HIP ARTHROPLASTY Left 02/22/2024   Procedure: HEMIARTHROPLASTY (BIPOLAR) HIP, direct lateral  APPROACH FOR FRACTURE;  Surgeon: Margrette Taft BRAVO, MD;  Location: [facility];  Service: Orthopedics;  Laterality: Left;   HYSTEROSCOPY     TUBAL LIGATION  1993   Patient Active Problem List   Diagnosis Date Noted   Hypokalemia 02/20/2024   Closed fracture of neck of left femur (HCC) 02/20/2024   Mixed hyperlipidemia 02/20/2024   Yeast infection 02/17/2020   Vaginal itching 02/17/2020   Vaginal discharge 02/17/2020   Superficial fungus infection of skin 04/27/2015   Lichen sclerosus 05/09/2013   Vitiligo 04/23/2013    Diabetes (HCC) 04/23/2013   Hypertension 04/23/2013   Leukoplakia, 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   Breast cancer (HCC) 12/16/2012    [facility]: [facility]   REFERRING PROVIDER: Margrette Taft BRAVO, MD  REFERRING DIAG: S72.002D (ICD-10-CM) - Closed fracture of neck of left femur with routine healing, subsequent encounter  THERAPY DIAG:  Weakness of left hip  Impaired functional mobility, balance, gait, and endurance  Difficulty walking  Rationale for Evaluation and Treatment: Rehabilitation  ONSET DATE: 02/09/24 fall, 02/22/24 surgery  SUBJECTIVE:   SUBJECTIVE STATEMENT: Pt states she can tell improvements, has been walking around the house some without AD.  No reports of pain or recent fall.  Saw Harrison yesterday, returns 09/29/24, happy with progress but wishes to continue with hip strengthening.  Eval:  Pt states she had fallen in [employer] [place] parking lot and went to [place] on vacation, spent most of the time in the room and came back and found out hip was broke.  PERTINENT HISTORY: Cancer 2010  PAIN:  Are you having pain? No  PRECAUTIONS: Fall  RED FLAGS: None   WEIGHT BEARING RESTRICTIONS: No  FALLS:  Has patient fallen in last 6 months? Yes. Number of falls 1  LIVING ENVIRONMENT: Lives with: lives with their spouse Lives in: House/apartment Stairs: Yes: Internal: 13  steps; on right going up and External: 6 steps; on right going up Has following equipment at home: Single point cane and Walker - 2 wheeled  OCCUPATION: retired  PLOF: Independent with basic ADLs, Needs assistance with ADLs, and Needs assistance with homemaking  PATIENT GOALS: get off of the cane, walk better, be able to walk further too for traveling   NEXT MD VISIT: 06/26/24; 09/29/24 Margrette  OBJECTIVE:  Note: Objective measures were completed at Evaluation unless otherwise noted.  DIAGNOSTIC FINDINGS: CLINICAL DATA:  Closed fracture.  Postoperative.   EXAM: LEFT FEMUR 2  VIEWS   COMPARISON:  Pelvis and left hip radiographs 02/20/2024   FINDINGS: Interval left hip hemiarthroplasty. No perihardware lucency is seen to indicate hardware failure or loosening. Expected postoperative changes including lateral left hip and anterior and lateral left thigh subcutaneous air. Lateral left hip surgical skin staples. Individual suture anchors again overlie the right and left pubic bodies. Mild-to-moderate atherosclerotic of the mediolateral knee compartments. Mild chronic enthesopathic change at the quadriceps and patellar tendon insertions on the patella. No acute fracture or dislocation.   IMPRESSION: Interval left hip hemiarthroplasty without evidence of hardware failure.  PATIENT SURVEYS:  LEFS : 38/80   COGNITION: Overall cognitive status: Within functional limits for tasks assessed     SENSATION: WFL  PALPATION: Pt does not demonstrate any tenderness to palpation to surgical site, left hip joint or corresponding musculature  LOWER EXTREMITY ROM:  Active ROM Right eval Left eval  Hip flexion 101 106  Hip extension    Hip abduction    Hip adduction    Hip internal rotation    Hip external rotation    Knee flexion    Knee extension    Ankle dorsiflexion    Ankle plantarflexion    Ankle inversion    Ankle eversion     (Blank rows = not tested)  LOWER EXTREMITY MMT:  MMT Right eval Left eval  Hip flexion 3+ 3-  Hip extension 2+ 2+  Hip abduction 3 3-  Hip adduction 3 3  Hip internal rotation    Hip external rotation    Knee flexion    Knee extension    Ankle dorsiflexion    Ankle plantarflexion    Ankle inversion    Ankle eversion     (Blank rows = not tested)    FUNCTIONAL TESTS:  5 times sit to stand: 19.13 seconds, BUE support 2 minute walk test: 274 feet, SPC  GAIT: Distance walked: 300 feet Assistive device utilized: Single point cane Level of assistance: Modified independence Comments: Pt demonstrates  trendelenburg gait pattern which is help significantly with SPC. Pt demonstrates decreased gait speed and decreased stance time on LLE.                                                                                                                                TREATMENT DATE:  07/02/24: Gait training no AD  238ft; cueing for heel strike, equal stride length and stance phase Treadmill .8-->1.2 x 4' (2', break then 2 more minutes Standing by stairs:  - Rockerboard: Rt/Lt then Df/PF 2' each  - Squat 15 front of chair 1 HHA  - Lunges onto 6in step height  - Tandem stance 6in step palloff with yellow ball  - Vector stance 5x 5 with 1 HHA  - SLS Rt 10 Lt 12  -6in hurdles forward then lateral step over 4RT in //bars intermittent HHA Leg press 3Pl 2x 10  06/27/24: Nustep 5 minutes level 5, seat 10  UE/LE [place] 321ft no AD Standing in // bars with Bil UE support:  - Rockerboard: Rt/Lt then Df/PF 1' each  - 7in step height 5RT reciprocal ascend, step to and rotation descending  - Squat front of chair,cueing for mechanics  - Toe tapping 4# ankles, 2x 10; 1 HHA then no UE support  - Hip abduction 4# 10X3 holds each  - Marching with bottom kicks 4# ankle weights 1 RT  - Vector stance 3x 5 with UE support Leg press 3Pl 2x 10   06/25/24 Nustep 5 minutes level 4, seat 10  UE/LE [employer] leg press 4Pl 3X10  Standing:  in // with bil UE assist hip hikes flat on ground 2X10  Step up and overs forward 6 10X  Step up and overs lateral 6 10X  High marching with 4# weights alternating 10X   Hip abduction 4# 10X each  Hip extension 4# 10X each Lateral stepping on line GTB 20 foot 2RT Marching with bottom kicks 3# ankle weights 1 RT Sit to stands no UE 10X   06/13/2024  Therapeutic Exercise: -Stationary bike, 5 minutes, level 3 resistance, pt cued for increased pace -Lateral stepping 3 laps 10 steps per lap, with RTB around ankles, pt cued for upright posture -Leg press, 2  sets of 10 reps, plate 3>plate 4, pt cued for avoidance of knee lock out Neuromuscular Re-education: -Standing hip hike, 1 set of 10 reps, pt cued to remain in pain free ROM -Walking marches and butt kicks, 3 lb ankle weights, 1 lap on 20 foot lap, pt cued for increased LE ROM Therapeutic Activity: -Sit to stands, 2 sets of 6 reps, pt cued for core activation -Step up and overs, 1 set for 7 reps, 8 inch step, pt cued for alternating leading LE and for decreased UE support -Lateral step up and overs, 1 set of 7 reps, 8 inch step, pt cued for decreased UE support   06/10/24: [place] UE/LE x 5' L3 Standing:  -Toe tapping 6in step alternating with no HHA  - Heel raises incline slope 15x  - Toe raises decline slope 15  - SLS Rt 10, Lt 4  - Tandem stance 1x 30  - Tandem stance on foam 2x 30  - Sidestep inside // bars minimal to no HHA (1st time without resistance, 2RT with RTB around thigh) 3RT total  - Forward step up 4 then 6  - Step down 6in 15x   06/06/24 Nustep [employer] UE/LE x 5' Standing: -Heel raises incline slope - Toe raises decline slope -Abduction 15 -Extension 15 -Tandem stance  Supine: LLD from ASIS to medial condyle  Rt 21.5 in, Lt 19in -Bridge with RTB around thigh Sidelying: - Clam with RTB     PATIENT EDUCATION:  Education details: Pt was educated on findings of PT evaluation, prognosis, frequency of therapy visits and rationale, attendance policy,  and HEP if given.   Person educated: Patient Education method: Explanation, Verbal cues, and Handouts Education comprehension: verbalized understanding, verbal cues required, and needs further education  HOME EXERCISE PROGRAM: Access Code: [email or web address] URL: [email or web address] Date: 05/29/2024 Prepared by: Lang Ada  Exercises - Supine Bridge  - 1 x daily - 7 x weekly - 3 sets - 10 reps - Clamshell with Resistance  - 1 x daily - 7 x weekly - 3 sets - 10 reps - Sit to  Stand with Arms Crossed  - 1 x daily - 7 x weekly - 3 sets - 10 reps  06/04/24 - Standing Hip Abduction with Counter Support  - 2 x daily - 7 x weekly - 2 sets - 10 reps - Standing Hip Extension with Counter Support  - 2 x daily - 7 x weekly - 2 sets - 10 reps  06/10/24: - sidestep with theraband resistance  06/27/24: - Squat with Chair Touch  - 2 x daily - 7 x weekly - 1 sets - 10 reps - Single Leg Stance  - 2 x daily - 7 x weekly - 1 sets - 3 reps - 30 hold  ASSESSMENT:  CLINICAL IMPRESSION: Began session with gait training to equalize stance phase and improve stride length with some cueing for heel to toe mechanics.  Began treadmill with verbal cueing to improve gait mechanics.  Increased fatigue with new activity, required standing rest break following 2 minutes on treadmill.  Session focus with proximal strengthening and balance training.  Added hurdles to improve SLS, required intermittent HHA for balance.  No reports of pain through session, was limited by fatigue.    Eval:  Patient is a 78 y.o. female who was seen today for physical therapy evaluation and treatment for S72.002D (ICD-10-CM) - Closed fracture of neck of left femur with routine healing, subsequent encounter. Patient demonstrates abnormal burning sensation of left hip, decreased LLE strength, abnormal gait pattern, and impaired balance. Patient also demonstrates difficulty with ambulation during today's session with decreased stride length and velocity noted. Patient also demonstrates bilateral LE weakness especially abduction and extension of L hip. Patient requires education on role of PT, HEP, importance of increased physical activity. Patient would benefit from skilled physical therapy for increased endurance with ambulation, increased LE strength, and balance for improved improved independence with gait, return to higher level of function with ADLs, and progress towards therapy goals.   OBJECTIVE IMPAIRMENTS: Abnormal  gait, decreased activity tolerance, decreased balance, decreased knowledge of use of DME, decreased mobility, difficulty walking, decreased strength, and pain.   ACTIVITY LIMITATIONS: carrying, lifting, squatting, transfers, and bed mobility  PARTICIPATION LIMITATIONS: meal prep, cleaning, laundry, driving, shopping, community activity, and yard work  PERSONAL FACTORS: Age, Past/current experiences, Time since onset of injury/illness/exacerbation, and 1 comorbidity: fall history are also affecting patient's functional outcome.   REHAB POTENTIAL: Good  CLINICAL DECISION MAKING: Stable/uncomplicated  EVALUATION COMPLEXITY: Low   GOALS: Goals reviewed with patient? No  SHORT TERM GOALS: Target date: 06/19/24  Patient will demonstrate evidence of independence with individualized HEP and will report compliance for at least 3 days per week for optimized progression towards remaining therapy goals. Baseline:  Goal status: INITIAL  2.  Patient will report a decrease in pain level during community ambulation by at least 2 points for improved quality of life. Baseline: 5/10 Goal status: INITIAL     LONG TERM GOALS: Target date: 07/10/24  Pt will demonstrate a an increase of at least 9  points on the LEFS for improved performance of community ambulation and ADL. Baseline: see objective Goal status: INITIAL  2.  Pt will improve 2 MWT by 40 feet with no AD in order to demonstrate improved functional ambulatory capacity in community setting.  Baseline: see objective Goal status: INITIAL  3.  Pt will demonstrate WFL gait pattern with no AD and no trendelenburg pattern, for increased mobility and maximal efficiency of gait cycle during ambulation. Baseline: must have SPC to avoid abnormal gait pattern Goal status: INITIAL  4.  Pt will demonstrate at least 4/5 MMT for right lower extremity for increased strength during ADL and community ambulation. Baseline: see objective Goal status:  INITIAL  5.  Pt will improve 5TSTS time by at least 5 seconds in order to improve strength during functional activities. Baseline: see objective Goal status: INITIAL    PLAN:  PT FREQUENCY: 3x per week and then 1-2 times per week  PT DURATION: 6 weeks  PLANNED INTERVENTIONS: 97110-Therapeutic exercises, 97530- Therapeutic activity, 97112- Neuromuscular re-education, 97535- Self Care, [id number]- Manual therapy, [phone]- Gait training, Patient/Family education, Balance training, Stair training, Joint mobilization, and DME instructions  PLAN FOR NEXT SESSION: progress proximal LE strengthening (focus on glute max and medius), progress balance and functional strength to pt toleration, progress gait training to avoid trendelenburg pattern.  Treadmill, SLS activities, functional strengthening.  F/U with walking program as HEP, add if not doing yet.  Trial with sled push/pull.  Augustin Mclean, LPTA/CLT; CBIS [phone] 4:16 PM, 07/02/24

## 2024-07-04 ENCOUNTER — Ambulatory Visit (HOSPITAL_COMMUNITY)

## 2024-07-07 ENCOUNTER — Ambulatory Visit (HOSPITAL_COMMUNITY)

## 2024-07-07 DIAGNOSIS — R29898 Other symptoms and signs involving the musculoskeletal system: Secondary | ICD-10-CM | POA: Diagnosis not present

## 2024-07-07 DIAGNOSIS — R262 Difficulty in walking, not elsewhere classified: Secondary | ICD-10-CM

## 2024-07-07 DIAGNOSIS — Z7409 Other reduced mobility: Secondary | ICD-10-CM

## 2024-07-07 NOTE — Therapy (Signed)
 OUTPATIENT PHYSICAL THERAPY LOWER EXTREMITY TREATMENT   Patient Name: Tiffany Hanson MRN: [id number] DOB:Aug 26, 1946, 78 y.o., female Today's Date: 07/07/2024  END OF SESSION:  PT End of Session - 07/07/24 1105     Visit Number 9    Number of Visits 15    Date for PT Re-Evaluation 07/10/24    Authorization Type [employer]    Authorization Time Period no auth    PT Start Time 1104    PT Stop Time 1144    PT Time Calculation (min) 40 min    Activity Tolerance Patient tolerated treatment well    Behavior During Therapy [facility] for tasks assessed/performed            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   Lichen sclerosus 05/09/2013   Osteopenia    Personal history of chemotherapy    Right breast   Personal history of radiation therapy    Right breast   S/P radiation therapy    completed in April 2010   Superficial fungus infection of skin 04/27/2015   Vaginal Pap smear, abnormal    Vitiligo    Past Surgical History:  Procedure Laterality Date   BLADDER SUSPENSION     16 to 17 years ago   BREAST LUMPECTOMY  08/20/08   HIP ARTHROPLASTY Left 02/22/2024   Procedure: HEMIARTHROPLASTY (BIPOLAR) HIP, direct lateral  APPROACH FOR FRACTURE;  Surgeon: Margrette Taft BRAVO, MD;  Location: [facility];  Service: Orthopedics;  Laterality: Left;   HYSTEROSCOPY     TUBAL LIGATION  1993   Patient Active Problem List   Diagnosis Date Noted   Hypokalemia 02/20/2024   Closed fracture of neck of left femur (HCC) 02/20/2024   Mixed hyperlipidemia 02/20/2024   Yeast infection 02/17/2020   Vaginal itching 02/17/2020   Vaginal discharge 02/17/2020   Superficial fungus infection of skin 04/27/2015   Lichen sclerosus 05/09/2013   Vitiligo 04/23/2013    Diabetes (HCC) 04/23/2013   Hypertension 04/23/2013   Leukoplakia, 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   Breast cancer (HCC) 12/16/2012    [facility]: [facility]   REFERRING PROVIDER: Margrette Taft BRAVO, MD  REFERRING DIAG: S72.002D (ICD-10-CM) - Closed fracture of neck of left femur with routine healing, subsequent encounter  THERAPY DIAG:  Weakness of left hip  Impaired functional mobility, balance, gait, and endurance  Difficulty walking  Rationale for Evaluation and Treatment: Rehabilitation  ONSET DATE: 02/09/24 fall, 02/22/24 surgery  SUBJECTIVE:   SUBJECTIVE STATEMENT: Limp has gotten better but it's still there.  Eval:  Pt states she had fallen in [place] parking lot and went to [place] on vacation, spent most of the time in the room and came back and found out hip was broke.  PERTINENT HISTORY: Cancer 2010  PAIN:  Are you having pain? No  PRECAUTIONS: Fall  RED FLAGS: None   WEIGHT BEARING RESTRICTIONS: No  FALLS:  Has patient fallen in last 6 months? Yes. Number of falls 1  LIVING ENVIRONMENT: Lives with: lives with their spouse Lives in: House/apartment Stairs: Yes: Internal: 13 steps; on right going up and External: 6 steps; on right going up Has following equipment at home: Single point cane and Walker - 2 wheeled  OCCUPATION: retired  PLOF:  Independent with basic ADLs, Needs assistance with ADLs, and Needs assistance with homemaking  PATIENT GOALS: get off of the cane, walk better, be able to walk further too for traveling   NEXT MD VISIT: 06/26/24; 09/29/24 Margrette  OBJECTIVE:  Note: Objective measures were completed at Evaluation unless otherwise noted.  DIAGNOSTIC FINDINGS: CLINICAL DATA:  Closed fracture.  Postoperative.   EXAM: LEFT FEMUR 2 VIEWS   COMPARISON:  Pelvis and left hip radiographs 02/20/2024   FINDINGS: Interval left hip hemiarthroplasty. No perihardware lucency is seen to indicate hardware failure or  loosening. Expected postoperative changes including lateral left hip and anterior and lateral left thigh subcutaneous air. Lateral left hip surgical skin staples. Individual suture anchors again overlie the right and left pubic bodies. Mild-to-moderate atherosclerotic of the mediolateral knee compartments. Mild chronic enthesopathic change at the quadriceps and patellar tendon insertions on the patella. No acute fracture or dislocation.   IMPRESSION: Interval left hip hemiarthroplasty without evidence of hardware failure.  PATIENT SURVEYS:  LEFS : 38/80   COGNITION: Overall cognitive status: Within functional limits for tasks assessed     SENSATION: WFL  PALPATION: Pt does not demonstrate any tenderness to palpation to surgical site, left hip joint or corresponding musculature  LOWER EXTREMITY ROM:  Active ROM Right eval Left eval  Hip flexion 101 106  Hip extension    Hip abduction    Hip adduction    Hip internal rotation    Hip external rotation    Knee flexion    Knee extension    Ankle dorsiflexion    Ankle plantarflexion    Ankle inversion    Ankle eversion     (Blank rows = not tested)  LOWER EXTREMITY MMT:  MMT Right eval Left eval  Hip flexion 3+ 3-  Hip extension 2+ 2+  Hip abduction 3 3-  Hip adduction 3 3  Hip internal rotation    Hip external rotation    Knee flexion    Knee extension    Ankle dorsiflexion    Ankle plantarflexion    Ankle inversion    Ankle eversion     (Blank rows = not tested)    FUNCTIONAL TESTS:  5 times sit to stand: 19.13 seconds, BUE support 2 minute walk test: 274 feet, SPC  GAIT: Distance walked: 300 feet Assistive device utilized: Single point cane Level of assistance: Modified independence Comments: Pt demonstrates trendelenburg gait pattern which is help significantly with SPC. Pt demonstrates decreased gait speed and decreased stance time on LLE.                                                                                                                                 TREATMENT DATE:  07/07/24 Sit to stand x 10 no UE assist Sit to stand holding tidal tank on blue foam pad x 10 F/B and S/S rocker board x 1' each Heel raises on incline 2 x 10  Toe raises on decline 2 x 10 3# hip abduction 2 x 10 3# hip extension 2 x 10 Gait training with mirror and using dowel rods to assist with arm swing.     07/02/24: Gait training no AD 229ft; cueing for heel strike, equal stride length and stance phase Treadmill .8-->1.2 x 4' (2', break then 2 more minutes Standing by stairs:  - Rockerboard: Rt/Lt then Df/PF 2' each  - Squat 15 front of chair 1 HHA  - Lunges onto 6in step height  - Tandem stance 6in step palloff with yellow ball  - Vector stance 5x 5 with 1 HHA  - SLS Rt 10 Lt 12  -6in hurdles forward then lateral step over 4RT in //bars intermittent HHA Leg press 3Pl 2x 10  06/27/24: Nustep 5 minutes level 5, seat 10  UE/LE [place] 383ft no AD Standing in // bars with Bil UE support:  - Rockerboard: Rt/Lt then Df/PF 1' each  - 7in step height 5RT reciprocal ascend, step to and rotation descending  - Squat front of chair,cueing for mechanics  - Toe tapping 4# ankles, 2x 10; 1 HHA then no UE support  - Hip abduction 4# 10X3 holds each  - Marching with bottom kicks 4# ankle weights 1 RT  - Vector stance 3x 5 with UE support Leg press 3Pl 2x 10   06/25/24 Nustep 5 minutes level 4, seat 10  UE/LE [employer] leg press 4Pl 3X10  Standing:  in // with bil UE assist hip hikes flat on ground 2X10  Step up and overs forward 6 10X  Step up and overs lateral 6 10X  High marching with 4# weights alternating 10X   Hip abduction 4# 10X each  Hip extension 4# 10X each Lateral stepping on line GTB 20 foot 2RT Marching with bottom kicks 3# ankle weights 1 RT Sit to stands no UE 10X   06/13/2024  Therapeutic Exercise: -Stationary bike, 5 minutes, level 3 resistance, pt cued  for increased pace -Lateral stepping 3 laps 10 steps per lap, with RTB around ankles, pt cued for upright posture -Leg press, 2 sets of 10 reps, plate 3>plate 4, pt cued for avoidance of knee lock out Neuromuscular Re-education: -Standing hip hike, 1 set of 10 reps, pt cued to remain in pain free ROM -Walking marches and butt kicks, 3 lb ankle weights, 1 lap on 20 foot lap, pt cued for increased LE ROM Therapeutic Activity: -Sit to stands, 2 sets of 6 reps, pt cued for core activation -Step up and overs, 1 set for 7 reps, 8 inch step, pt cued for alternating leading LE and for decreased UE support -Lateral step up and overs, 1 set of 7 reps, 8 inch step, pt cued for decreased UE support   06/10/24: [place]ustep [place] UE/LE x 5' L3 Standing:  -Toe tapping 6in step alternating with no HHA  - Heel raises incline slope 15x  - Toe raises decline slope 15  - SLS Rt 10, Lt 4  - Tandem stance 1x 30  - Tandem stance on foam 2x 30  - Sidestep inside // bars minimal to no HHA (1st time without resistance, 2RT with RTB around thigh) 3RT total  - Forward step up 4 then 6  - Step down 6in 15x   06/06/24 Nustep [employer] UE/LE x 5' Standing: -Heel raises incline slope - Toe raises decline slope -Abduction 15 -Extension 15 -Tandem stance  Supine: LLD from ASIS to medial condyle  Rt 21.5 in, Lt 19in -Bridge with RTB around thigh Sidelying: - Clam with RTB     PATIENT EDUCATION:  Education details: Pt was educated on findings of PT evaluation, prognosis, frequency of therapy visits and rationale, attendance policy, and HEP if given.   Person educated: Patient Education method: Explanation, Verbal cues, and Handouts Education comprehension: verbalized understanding, verbal cues required, and needs further education  HOME EXERCISE PROGRAM: Access Code: [email or web address] URL: [email or web address] Date: 05/29/2024 Prepared by: Lang Ada  Exercises - Supine Bridge   - 1 x daily - 7 x weekly - 3 sets - 10 reps - Clamshell with Resistance  - 1 x daily - 7 x weekly - 3 sets - 10 reps - Sit to Stand with Arms Crossed  - 1 x daily - 7 x weekly - 3 sets - 10 reps  06/04/24 - Standing Hip Abduction with Counter Support  - 2 x daily - 7 x weekly - 2 sets - 10 reps - Standing Hip Extension with Counter Support  - 2 x daily - 7 x weekly - 2 sets - 10 reps  06/10/24: - sidestep with theraband resistance  06/27/24: - Squat with Chair Touch  - 2 x daily - 7 x weekly - 1 sets - 10 reps - Single Leg Stance  - 2 x daily - 7 x weekly - 1 sets - 3 reps - 30 hold  ASSESSMENT:  CLINICAL IMPRESSION: Arrives today without cane; still slight antalgic gait.  Continued with focus on lower extremity strengthening, balance and gait.  Patient improves gait with increased focus but continues with decreased arm swing so added dowels with PT assist with arm swing but patient with difficulty with carry over after removing dowel assistance. Patient will benefit from continued skilled therapy services  to address deficits and promote return to optimal function.      Eval:  Patient is a 78 y.o. female who was seen today for physical therapy evaluation and treatment for S72.002D (ICD-10-CM) - Closed fracture of neck of left femur with routine healing, subsequent encounter. Patient demonstrates abnormal burning sensation of left hip, decreased LLE strength, abnormal gait pattern, and impaired balance. Patient also demonstrates difficulty with ambulation during today's session with decreased stride length and velocity noted. Patient also demonstrates bilateral LE weakness especially abduction and extension of L hip. Patient requires education on role of PT, HEP, importance of increased physical activity. Patient would benefit from skilled physical therapy for increased endurance with ambulation, increased LE strength, and balance for improved improved independence with gait, return to higher level  of function with ADLs, and progress towards therapy goals.   OBJECTIVE IMPAIRMENTS: Abnormal gait, decreased activity tolerance, decreased balance, decreased knowledge of use of DME, decreased mobility, difficulty walking, decreased strength, and pain.   ACTIVITY LIMITATIONS: carrying, lifting, squatting, transfers, and bed mobility  PARTICIPATION LIMITATIONS: meal prep, cleaning, laundry, driving, shopping, community activity, and yard work  PERSONAL FACTORS: Age, Past/current experiences, Time since onset of injury/illness/exacerbation, and 1 comorbidity: fall history are also affecting patient's functional outcome.   REHAB POTENTIAL: Good  CLINICAL DECISION MAKING: Stable/uncomplicated  EVALUATION COMPLEXITY: Low   GOALS: Goals reviewed with patient? No  SHORT TERM GOALS: Target date: 06/19/24  Patient will demonstrate evidence of independence with individualized HEP and will report compliance for at least 3 days per week for optimized progression towards remaining therapy goals. Baseline:  Goal status: INITIAL  2.  Patient will report a decrease in pain level during community ambulation  by at least 2 points for improved quality of life. Baseline: 5/10 Goal status: INITIAL     LONG TERM GOALS: Target date: 07/10/24  Pt will demonstrate a an increase of at least 9 points on the LEFS for improved performance of community ambulation and ADL. Baseline: see objective Goal status: INITIAL  2.  Pt will improve 2 MWT by 40 feet with no AD in order to demonstrate improved functional ambulatory capacity in community setting.  Baseline: see objective Goal status: INITIAL  3.  Pt will demonstrate WFL gait pattern with no AD and no trendelenburg pattern, for increased mobility and maximal efficiency of gait cycle during ambulation. Baseline: must have SPC to avoid abnormal gait pattern Goal status: INITIAL  4.  Pt will demonstrate at least 4/5 MMT for right lower extremity for  increased strength during ADL and community ambulation. Baseline: see objective Goal status: INITIAL  5.  Pt will improve 5TSTS time by at least 5 seconds in order to improve strength during functional activities. Baseline: see objective Goal status: INITIAL    PLAN:  PT FREQUENCY: 3x per week and then 1-2 times per week  PT DURATION: 6 weeks  PLANNED INTERVENTIONS: 97110-Therapeutic exercises, 97530- Therapeutic activity, 97112- Neuromuscular re-education, 97535- Self Care, [id number]- Manual therapy, [phone]- Gait training, Patient/Family education, Balance training, Stair training, Joint mobilization, and DME instructions  PLAN FOR NEXT SESSION: progress proximal LE strengthening (focus on glute max and medius), progress balance and functional strength to pt toleration, progress gait training to avoid trendelenburg pattern.  Treadmill, SLS activities, functional strengthening.  F/U with walking program as HEP, add if not doing yet.  Trial with sled push/pull. Reassess next visit  11:46 AM, 07/07/24 Jood Retana Small Guido Comp MPT [place] [facility] [place] [phone]

## 2024-07-08 ENCOUNTER — Ambulatory Visit (HOSPITAL_COMMUNITY): Admitting: Physical Therapy

## 2024-07-11 ENCOUNTER — Encounter (HOSPITAL_COMMUNITY): Payer: Self-pay

## 2024-07-11 ENCOUNTER — Ambulatory Visit (HOSPITAL_COMMUNITY)

## 2024-07-11 DIAGNOSIS — Z7409 Other reduced mobility: Secondary | ICD-10-CM

## 2024-07-11 DIAGNOSIS — R29898 Other symptoms and signs involving the musculoskeletal system: Secondary | ICD-10-CM

## 2024-07-11 DIAGNOSIS — R262 Difficulty in walking, not elsewhere classified: Secondary | ICD-10-CM

## 2024-07-11 NOTE — Therapy (Addendum)
 OUTPATIENT PHYSICAL THERAPY LOWER EXTREMITY TREATMENT  PHYSICAL THERAPY DISCHARGE SUMMARY  Visits from Start of Care: 9  Current functional level related to goals / functional outcomes: WFL   Remaining deficits: None   Education / Equipment: HEP review, importance of continued HEP and normal physical activities.   Patient agrees to discharge. Patient goals were met. Patient is being discharged due to meeting the stated rehab goals.  Patient Name: Tiffany Hanson MRN: [id number] DOB:30-Oct-1946, 78 y.o., female Today's Date: 07/11/2024  END OF SESSION:  PT End of Session - 07/11/24 1517     Visit Number 10    Number of Visits 15    Date for PT Re-Evaluation 07/10/24    Authorization Type [employer]    Authorization Time Period no auth    PT Start Time 1518    PT Stop Time 1547    PT Time Calculation (min) 29 min    Equipment Utilized During Treatment Gait belt    Activity Tolerance Patient tolerated treatment well    Behavior During Therapy WFL for tasks assessed/performed             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   Lichen sclerosus 05/09/2013   Osteopenia    Personal history of chemotherapy    Right breast   Personal history of radiation therapy    Right breast   S/P radiation therapy    completed in April 2010   Superficial fungus infection of skin 04/27/2015   Vaginal Pap smear, abnormal    Vitiligo    Past Surgical History:  Procedure Laterality Date   BLADDER SUSPENSION     16 to 17 years ago   BREAST LUMPECTOMY  08/20/08   HIP ARTHROPLASTY Left 02/22/2024   Procedure: HEMIARTHROPLASTY (BIPOLAR) HIP, direct lateral  APPROACH FOR FRACTURE;  Surgeon: Margrette Taft BRAVO, MD;  Location: [facility];   Service: Orthopedics;  Laterality: Left;   HYSTEROSCOPY     TUBAL LIGATION  1993   Patient Active Problem List   Diagnosis Date Noted   Hypokalemia 02/20/2024   Closed fracture of neck of left femur (HCC) 02/20/2024   Mixed hyperlipidemia 02/20/2024   Yeast infection 02/17/2020   Vaginal itching 02/17/2020   Vaginal discharge 02/17/2020   Superficial fungus infection of skin 04/27/2015   Lichen sclerosus 05/09/2013   Vitiligo 04/23/2013   Diabetes (HCC) 04/23/2013   Hypertension 04/23/2013   Leukoplakia, 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   Breast cancer (HCC) 12/16/2012    [facility]P: [facility]   REFERRING PROVIDER: Margrette Taft BRAVO, MD  REFERRING DIAG: S72.002D (ICD-10-CM) - Closed fracture of neck of left femur with routine healing, subsequent encounter  THERAPY DIAG:  Weakness of left hip - Plan: PT plan of care cert/re-cert  Impaired functional mobility, balance, gait, and endurance - Plan: PT plan of care cert/re-cert  Difficulty walking - Plan: PT plan of care cert/re-cert  Rationale for Evaluation and Treatment: Rehabilitation  ONSET DATE: 02/09/24 fall, 02/22/24 surgery  SUBJECTIVE:   SUBJECTIVE STATEMENT: Pt states she is not any pain today. Pt states she is signed up to go [facility] [employer] next week. Pt states HEP is going well. Pt states she feels stronger, and greatly improved since having therapy.  Pt reports about 85%.  Eval:  Pt states she had fallen in [employer] parking lot and went to [place] on vacation, spent most of the time in the room and came back and found out hip was broke.  PERTINENT HISTORY: Cancer 2010  PAIN:  Are you having pain? No  PRECAUTIONS: Fall  RED FLAGS: None   WEIGHT BEARING RESTRICTIONS: No  FALLS:  Has patient fallen in last 6 months? Yes. Number of falls 1  LIVING ENVIRONMENT: Lives with: lives with their spouse Lives in: House/apartment Stairs: Yes: Internal: 13 steps; on right going up and External: 6 steps; on  right going up Has following equipment at home: Single point cane and Walker - 2 wheeled  OCCUPATION: retired  PLOF: Independent with basic ADLs, Needs assistance with ADLs, and Needs assistance with homemaking  PATIENT GOALS: get off of the cane, walk better, be able to walk further too for traveling   NEXT MD VISIT: 06/26/24; 09/29/24 Margrette  OBJECTIVE:  Note: Objective measures were completed at Evaluation unless otherwise noted.  DIAGNOSTIC FINDINGS: CLINICAL DATA:  Closed fracture.  Postoperative.   EXAM: LEFT FEMUR 2 VIEWS   COMPARISON:  Pelvis and left hip radiographs 02/20/2024   FINDINGS: Interval left hip hemiarthroplasty. No perihardware lucency is seen to indicate hardware failure or loosening. Expected postoperative changes including lateral left hip and anterior and lateral left thigh subcutaneous air. Lateral left hip surgical skin staples. Individual suture anchors again overlie the right and left pubic bodies. Mild-to-moderate atherosclerotic of the mediolateral knee compartments. Mild chronic enthesopathic change at the quadriceps and patellar tendon insertions on the patella. No acute fracture or dislocation.   IMPRESSION: Interval left hip hemiarthroplasty without evidence of hardware failure.  PATIENT SURVEYS:  LEFS : 38/80  07/11/24: 70/80 COGNITION: Overall cognitive status: Within functional limits for tasks assessed     SENSATION: WFL  PALPATION: Pt does not demonstrate any tenderness to palpation to surgical site, left hip joint or corresponding musculature  LOWER EXTREMITY ROM:  Active ROM Right eval Left eval  Hip flexion 101 106  Hip extension    Hip abduction    Hip adduction    Hip internal rotation    Hip external rotation    Knee flexion    Knee extension    Ankle dorsiflexion    Ankle plantarflexion    Ankle inversion    Ankle eversion     (Blank rows = not tested)  LOWER EXTREMITY MMT:  MMT Right eval  Left eval Right 07/11/24 Left  07/11/24  Hip flexion 3+ 3- 4+ 4-  Hip extension 2+ 2+ 4 4  Hip abduction 3 3- 4+ 4  Hip adduction 3 3 4+ 4+  Hip internal rotation      Hip external rotation      Knee flexion      Knee extension      Ankle dorsiflexion      Ankle plantarflexion      Ankle inversion      Ankle eversion       (Blank rows = not tested)    FUNCTIONAL TESTS:  5 times sit to stand: 19.13 seconds, BUE support 2 minute walk test: 274 feet, SPC 07/11/24: 5TSTS: 12.99 seconds : 368 feet, no AD  GAIT: Distance walked: 300 feet Assistive device utilized: Single point cane Level of assistance: Modified independence Comments: Pt demonstrates trendelenburg gait pattern which is help significantly with SPC. Pt demonstrates decreased gait speed and decreased stance time on  LLE.                                                                                                                                TREATMENT DATE:  07/11/2024  Discharge note: HEP reviewed, goals tracked, education on physical activity and referral process should pt be in need of therapy services in the future. - for endurance and goal tracking -Stationary bike, 5 minutes, level 3 resistance, pt cued for increased pace -Standing hip hike, 1 set of 10 reps, pt cued to remain in pain free ROM -Sit to stands, 1 sets of 5 reps, pt cued for core activation   07/07/24 Sit to stand x 10 no UE assist Sit to stand holding tidal tank on blue foam pad x 10 F/B and S/S rocker board x 1' each Heel raises on incline 2 x 10 Toe raises on decline 2 x 10 3# hip abduction 2 x 10 3# hip extension 2 x 10 Gait training with mirror and using dowel rods to assist with arm swing.     07/02/24: Gait training no AD 267ft; cueing for heel strike, equal stride length and stance phase Treadmill .8-->1.2 x 4' (2', break then 2 more minutes Standing by stairs:  - Rockerboard: Rt/Lt then Df/PF 2' each  - Squat 15  front of chair 1 HHA  - Lunges onto 6in step height  - Tandem stance 6in step palloff with yellow ball  - Vector stance 5x 5 with 1 HHA  - SLS Rt 10 Lt 12  -6in hurdles forward then lateral step over 4RT in //bars intermittent HHA Leg press 3Pl 2x 10   PATIENT EDUCATION:  Education details: Pt was educated on findings of PT evaluation, prognosis, frequency of therapy visits and rationale, attendance policy, and HEP if given.   Person educated: Patient Education method: Explanation, Verbal cues, and Handouts Education comprehension: verbalized understanding, verbal cues required, and needs further education  HOME EXERCISE PROGRAM: Access Code: [email or web address] URL: [email or web address] Date: 05/29/2024 Prepared by: Lang Ada  Exercises - Supine Bridge  - 1 x daily - 7 x weekly - 3 sets - 10 reps - Clamshell with Resistance  - 1 x daily - 7 x weekly - 3 sets - 10 reps - Sit to Stand with Arms Crossed  - 1 x daily - 7 x weekly - 3 sets - 10 reps  06/04/24 - Standing Hip Abduction with Counter Support  - 2 x daily - 7 x weekly - 2 sets - 10 reps - Standing Hip Extension with Counter Support  - 2 x daily - 7 x weekly - 2 sets - 10 reps  06/10/24: - sidestep with theraband resistance  06/27/24: - Squat with Chair Touch  - 2 x daily - 7 x weekly - 1 sets - 10 reps - Single Leg Stance  - 2 x daily - 7 x weekly - 1 sets - 3 reps - 30 hold  ASSESSMENT:  CLINICAL IMPRESSION: Patient continues to demonstrate increased LE strength, improved gait quality and balance. Patient also demonstrates increased endurance with aerobic based exercise during today's session, increased distance on with no AD. Patient able to meet all goals set at initiation of therapy this date. Patient to be discharged to HEP this date due to meeting all therapy goals.       OBJECTIVE IMPAIRMENTS: Abnormal gait, decreased activity tolerance, decreased balance, decreased knowledge of use of DME,  decreased mobility, difficulty walking, decreased strength, and pain.   ACTIVITY LIMITATIONS: carrying, lifting, squatting, transfers, and bed mobility  PARTICIPATION LIMITATIONS: meal prep, cleaning, laundry, driving, shopping, community activity, and yard work  PERSONAL FACTORS: Age, Past/current experiences, Time since onset of injury/illness/exacerbation, and 1 comorbidity: fall history are also affecting patient's functional outcome.   REHAB POTENTIAL: Good  CLINICAL DECISION MAKING: Stable/uncomplicated  EVALUATION COMPLEXITY: Low   GOALS: Goals reviewed with patient? No  SHORT TERM GOALS: Target date: 06/19/24  Patient will demonstrate evidence of independence with individualized HEP and will report compliance for at least 3 days per week for optimized progression towards remaining therapy goals. Baseline:  Goal status: MET  2.  Patient will report a decrease in pain level during community ambulation by at least 2 points for improved quality of life. Baseline: 5/10 Goal status: MET     LONG TERM GOALS: Target date: 07/10/24  Pt will demonstrate a an increase of at least 9 points on the LEFS for improved performance of community ambulation and ADL. Baseline: see objective Goal status: MET  2.  Pt will improve 2 MWT by 40 feet with no AD in order to demonstrate improved functional ambulatory capacity in community setting.  Baseline: see objective Goal status: MET  3.  Pt will demonstrate WFL gait pattern with no AD and no trendelenburg pattern, for increased mobility and maximal efficiency of gait cycle during ambulation. Baseline: must have SPC to avoid abnormal gait pattern Goal status: MET  4.  Pt will demonstrate at least 4/5 MMT for right lower extremity for increased strength during ADL and community ambulation. Baseline: see objective Goal status: MET  5.  Pt will improve 5TSTS time by at least 5 seconds in order to improve strength during functional  activities. Baseline: see objective Goal status: MET    PLAN:  PT FREQUENCY: 3x per week and then 1-2 times per week  PT DURATION: 6 weeks  PLANNED INTERVENTIONS: 97110-Therapeutic exercises, 97530- Therapeutic activity, 97112- Neuromuscular re-education, 97535- Self Care, [id number]- Manual therapy, [phone]- Gait training, Patient/Family education, Balance training, Stair training, Joint mobilization, and DME instructions  PLAN FOR NEXT SESSION: discharged  Lang Ada, PT, DPT [facility] Office: [phone] 4:11 PM, 07/11/24

## 2024-07-21 ENCOUNTER — Other Ambulatory Visit: Payer: Self-pay | Admitting: Dermatology

## 2024-07-21 DIAGNOSIS — L209 Atopic dermatitis, unspecified: Secondary | ICD-10-CM

## 2024-07-21 DIAGNOSIS — L700 Acne vulgaris: Secondary | ICD-10-CM

## 2024-08-05 ENCOUNTER — Ambulatory Visit: Admitting: Dermatology

## 2024-08-05 ENCOUNTER — Encounter: Payer: Self-pay | Admitting: Dermatology

## 2024-08-05 DIAGNOSIS — L700 Acne vulgaris: Secondary | ICD-10-CM | POA: Diagnosis not present

## 2024-08-05 DIAGNOSIS — L209 Atopic dermatitis, unspecified: Secondary | ICD-10-CM

## 2024-08-05 MED ORDER — TRETINOIN 0.025 % EX CREA: TOPICAL_CREAM | CUTANEOUS | 8 refills | Status: DC

## 2024-08-05 MED ORDER — MOMETASONE FUROATE 0.1 % EX CREA: TOPICAL_CREAM | CUTANEOUS | 8 refills | Status: AC

## 2024-08-05 NOTE — Patient Instructions (Signed)

## 2024-08-05 NOTE — Progress Notes (Signed)
   Follow-Up Visit   Subjective  Tiffany Hanson is a 78 y.o. female who presents for the following: Medication refills for acne and atopic derm. Patient using tretionoin cream nightly for acne and mometasone  cream for atopic derm.   The following portions of the chart were reviewed this encounter and updated as appropriate: medications, allergies, medical history  Review of Systems:  No other skin or systemic complaints except as noted in HPI or Assessment and Plan.  Objective  Well appearing patient in no apparent distress; mood and affect are within normal limits.  A focused examination was performed of the following areas: Back, face  Relevant exam findings are noted in the Assessment and Plan.    Assessment & Plan   ACNE VULGARIS Exam: Open comedones on cheeks zygomas temples  Chronic condition with duration or expected duration over one year. Currently well-controlled.  Treatment Plan: Continue tretinoin  0.025% cream pea sized amount nightly to entire affected area.  Topical retinoid medications like tretinoin /Retin-A , adapalene/Differin, tazarotene/Fabior, and Epiduo/Epiduo Forte can cause dryness and irritation when first started. Only apply a pea-sized amount to the entire affected area. Avoid applying it around the eyes, edges of mouth and creases at the nose. If you experience irritation, use a good moisturizer first and/or apply the medicine less often. If you are doing well with the medicine, you can increase how often you use it until you are applying every night. Be careful with sun protection while using this medication as it can make you sensitive to the sun. This medicine should not be used by pregnant women.    ATOPIC DERMATITIS Exam: clear  Chronic condition with duration or expected duration over one year. Currently well-controlled.  Atopic dermatitis (eczema) is a chronic, relapsing, pruritic condition that can significantly affect quality of life. It is  often associated with allergic rhinitis and/or asthma and can require treatment with topical medications, phototherapy, or in severe cases biologic injectable medication (Dupixent; Adbry) or Oral JAK inhibitors.  Treatment Plan: Continue mometasone  0.1% cream apply QD on M, W, F, Sat.   Topical steroids (such as triamcinolone , fluocinolone, fluocinonide, mometasone , clobetasol , halobetasol, betamethasone, hydrocortisone) can cause thinning and lightening of the skin if they are used for too long in the same area. Your physician has selected the right strength medicine for your problem and area affected on the body. Please use your medication only as directed by your physician to prevent side effects.   Recommend gentle skin care.   ATOPIC DERMATITIS, UNSPECIFIED TYPE   Related Medications mometasone  (ELOCON ) 0.1 % cream Apply to rash on back QD on Monday, Wednesday, Friday, and Saturday PRN. ACNE VULGARIS   Related Medications tretinoin  (RETIN-A ) 0.025 % cream Apply a pea sized amount to the entire face QHS. OPEN COMEDONE    Return in about 1 year (around 08/05/2025) for medication refill.  I, Jacquelynn V. Wilfred, CMA, am acting as scribe for Boneta Sharps, MD .   Documentation: I have reviewed the above documentation for accuracy and completeness, and I agree with the above.  Boneta Sharps, MD

## 2024-09-01 DIAGNOSIS — Z6826 Body mass index (BMI) 26.0-26.9, adult: Secondary | ICD-10-CM | POA: Diagnosis not present

## 2024-09-01 DIAGNOSIS — E1169 Type 2 diabetes mellitus with other specified complication: Secondary | ICD-10-CM | POA: Diagnosis not present

## 2024-09-01 DIAGNOSIS — I10 Essential (primary) hypertension: Secondary | ICD-10-CM | POA: Diagnosis not present

## 2024-09-01 DIAGNOSIS — E785 Hyperlipidemia, unspecified: Secondary | ICD-10-CM | POA: Diagnosis not present

## 2024-09-01 DIAGNOSIS — E119 Type 2 diabetes mellitus without complications: Secondary | ICD-10-CM | POA: Diagnosis not present

## 2024-09-01 DIAGNOSIS — E782 Mixed hyperlipidemia: Secondary | ICD-10-CM | POA: Diagnosis not present

## 2024-09-29 ENCOUNTER — Encounter: Payer: Self-pay | Admitting: Orthopedic Surgery

## 2024-09-29 ENCOUNTER — Ambulatory Visit: Admitting: Orthopedic Surgery

## 2024-09-29 DIAGNOSIS — S72002D Fracture of unspecified part of neck of left femur, subsequent encounter for closed fracture with routine healing: Secondary | ICD-10-CM | POA: Diagnosis not present

## 2024-09-29 NOTE — Progress Notes (Signed)
    09/29/2024   Chief Complaint  Patient presents with   Post-op Follow-up    Hip pain left     Encounter Diagnosis  Name Primary?   Closed fracture of neck of left femur with routine healing, subsequent encounter 02/22/24 Bipolar Yes    What pharmacy do you use ? ____[facility]_______________________  DOI/DOS/ Date: 02/22/24  Did you get better, worse or no change (Answer below)   Improved going to the gym doing the home exercises from therapy

## 2024-09-29 NOTE — Progress Notes (Signed)
 FOLLOW-UP OFFICE VISIT   Patient: Tiffany Hanson           Date of Birth: 03-22-1946           MRN: [id number] Visit Date: 09/29/2024 Requested by: [facility] [address] [place],  [place] [address] PCP: [facility]    Encounter Diagnosis  Name Primary?   Closed fracture of neck of left femur with routine healing, subsequent encounter 02/22/24 Bipolar Yes    Chief Complaint  Patient presents with   Post-op Follow-up    Weakness left hip    78 year old female status post left femoral neck fracture treated with bipolar hip replacement patient developed postop abductor weakness which was treated with physical therapy  Patient is ambulating much better today has no pain in her hip and says that she uses her cane only when she has to do a significant amount of walking  She has been going [facility] the exercise facility [facility] on [address] and has been helping her significantly    ASSESSMENT AND PLAN Continue exercises to strengthen the hip activities as tolerated follow-up as needed

## 2024-10-01 ENCOUNTER — Other Ambulatory Visit: Payer: Self-pay

## 2024-10-01 DIAGNOSIS — L700 Acne vulgaris: Secondary | ICD-10-CM

## 2024-10-01 MED ORDER — TRETINOIN 0.025 % EX CREA: TOPICAL_CREAM | CUTANEOUS | 8 refills | Status: AC

## 2024-10-01 MED ORDER — TRETINOIN 0.025 % EX CREA: TOPICAL_CREAM | CUTANEOUS | 8 refills | Status: DC

## 2024-10-01 NOTE — Progress Notes (Signed)
 Patient came in today saying the pharmacy never received this prescription. They received the Mometasone  but not the Tretinoin . Resent to [facility]

## 2024-10-01 NOTE — Addendum Note (Signed)
 Addended by: JERRYE PLEASANT B on: 10/01/2024 02:23 PM   Modules accepted: Orders

## 2025-08-04 ENCOUNTER — Ambulatory Visit: Admitting: Dermatology
